# Patient Record
Sex: Female | Born: 1955
Health system: Southern US, Community
[De-identification: ages and names within clinical notes are randomized; demographics above are authoritative.]

## PROBLEM LIST (undated history)

## (undated) DIAGNOSIS — F429 Obsessive-compulsive disorder, unspecified: Secondary | ICD-10-CM

## (undated) DIAGNOSIS — M79671 Pain in right foot: Secondary | ICD-10-CM

## (undated) DIAGNOSIS — F419 Anxiety disorder, unspecified: Secondary | ICD-10-CM

## (undated) DIAGNOSIS — I10 Essential (primary) hypertension: Secondary | ICD-10-CM

## (undated) DIAGNOSIS — E7800 Pure hypercholesterolemia, unspecified: Secondary | ICD-10-CM

## (undated) DIAGNOSIS — J45909 Unspecified asthma, uncomplicated: Secondary | ICD-10-CM

## (undated) DIAGNOSIS — M1990 Unspecified osteoarthritis, unspecified site: Secondary | ICD-10-CM

## (undated) DIAGNOSIS — R269 Unspecified abnormalities of gait and mobility: Secondary | ICD-10-CM

## (undated) DIAGNOSIS — J329 Chronic sinusitis, unspecified: Secondary | ICD-10-CM

## (undated) DIAGNOSIS — K219 Gastro-esophageal reflux disease without esophagitis: Secondary | ICD-10-CM

## (undated) DIAGNOSIS — I341 Nonrheumatic mitral (valve) prolapse: Secondary | ICD-10-CM

## (undated) HISTORY — DX: Unspecified abnormalities of gait and mobility: R26.9

## (undated) HISTORY — DX: Obsessive-compulsive disorder, unspecified: F42.9

## (undated) HISTORY — PX: TRIGGER FINGER RELEASE: SHX641

## (undated) HISTORY — PX: CARPAL TUNNEL RELEASE: SHX101

## (undated) HISTORY — PX: TONSILLECTOMY AND ADENOIDECTOMY: SHX28

## (undated) HISTORY — DX: Pure hypercholesterolemia, unspecified: E78.00

## (undated) HISTORY — DX: Chronic sinusitis, unspecified: J32.9

## (undated) HISTORY — DX: Gastro-esophageal reflux disease without esophagitis: K21.9

## (undated) HISTORY — DX: Unspecified asthma, uncomplicated: J45.909

## (undated) HISTORY — DX: Anxiety disorder, unspecified: F41.9

## (undated) HISTORY — PX: HERNIA REPAIR: SHX51

## (undated) HISTORY — DX: Pain in right foot: M79.671

## (undated) HISTORY — DX: Unspecified osteoarthritis, unspecified site: M19.90

## (undated) HISTORY — DX: Nonrheumatic mitral (valve) prolapse: I34.1

## (undated) HISTORY — DX: Essential (primary) hypertension: I10

## (undated) HISTORY — PX: ABDOMINAL HYSTERECTOMY: SUR658

---
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
## 1997-11-03 ENCOUNTER — Encounter: Admission: RE | Admit: 1997-11-03 | Discharge: 1997-11-03 | Payer: Self-pay | Admitting: Hematology and Oncology

## 1997-11-09 ENCOUNTER — Ambulatory Visit (HOSPITAL_COMMUNITY): Admission: RE | Admit: 1997-11-09 | Discharge: 1997-11-09 | Payer: Self-pay

## 1997-11-14 ENCOUNTER — Encounter: Admission: RE | Admit: 1997-11-14 | Discharge: 1997-11-14 | Payer: Self-pay | Admitting: Internal Medicine

## 1997-12-09 ENCOUNTER — Encounter: Admission: RE | Admit: 1997-12-09 | Discharge: 1997-12-09 | Payer: Self-pay | Admitting: Hematology and Oncology

## 1998-03-19 ENCOUNTER — Encounter: Admission: RE | Admit: 1998-03-19 | Discharge: 1998-03-19 | Payer: Self-pay | Admitting: Internal Medicine

## 1998-03-25 ENCOUNTER — Encounter: Payer: Self-pay | Admitting: Internal Medicine

## 1998-03-25 ENCOUNTER — Ambulatory Visit (HOSPITAL_COMMUNITY): Admission: RE | Admit: 1998-03-25 | Discharge: 1998-03-25 | Payer: Self-pay | Admitting: *Deleted

## 1998-03-30 ENCOUNTER — Ambulatory Visit (HOSPITAL_COMMUNITY): Admission: RE | Admit: 1998-03-30 | Discharge: 1998-03-30 | Payer: Self-pay | Admitting: Internal Medicine

## 1998-03-30 ENCOUNTER — Encounter: Admission: RE | Admit: 1998-03-30 | Discharge: 1998-03-30 | Payer: Self-pay | Admitting: Hematology and Oncology

## 1998-05-04 ENCOUNTER — Other Ambulatory Visit: Admission: RE | Admit: 1998-05-04 | Discharge: 1998-05-04 | Payer: Self-pay | Admitting: *Deleted

## 1998-06-16 ENCOUNTER — Encounter: Admission: RE | Admit: 1998-06-16 | Discharge: 1998-06-16 | Payer: Self-pay | Admitting: Hematology and Oncology

## 1998-09-15 ENCOUNTER — Encounter: Payer: Self-pay | Admitting: Internal Medicine

## 1998-09-15 ENCOUNTER — Encounter: Admission: RE | Admit: 1998-09-15 | Discharge: 1998-09-15 | Payer: Self-pay | Admitting: Internal Medicine

## 1998-09-15 ENCOUNTER — Ambulatory Visit (HOSPITAL_COMMUNITY): Admission: RE | Admit: 1998-09-15 | Discharge: 1998-09-15 | Payer: Self-pay | Admitting: Internal Medicine

## 1998-09-30 ENCOUNTER — Encounter: Admission: RE | Admit: 1998-09-30 | Discharge: 1998-09-30 | Payer: Self-pay | Admitting: Internal Medicine

## 1998-12-02 ENCOUNTER — Other Ambulatory Visit: Admission: RE | Admit: 1998-12-02 | Discharge: 1998-12-02 | Payer: Self-pay | Admitting: *Deleted

## 1999-05-05 ENCOUNTER — Encounter: Admission: RE | Admit: 1999-05-05 | Discharge: 1999-05-05 | Payer: Self-pay | Admitting: Internal Medicine

## 1999-05-14 ENCOUNTER — Other Ambulatory Visit: Admission: RE | Admit: 1999-05-14 | Discharge: 1999-05-14 | Payer: Self-pay | Admitting: *Deleted

## 1999-06-03 ENCOUNTER — Encounter: Admission: RE | Admit: 1999-06-03 | Discharge: 1999-06-03 | Payer: Self-pay | Admitting: Internal Medicine

## 1999-07-14 ENCOUNTER — Encounter: Admission: RE | Admit: 1999-07-14 | Discharge: 1999-07-14 | Payer: Self-pay | Admitting: Hematology and Oncology

## 2000-06-14 ENCOUNTER — Other Ambulatory Visit: Admission: RE | Admit: 2000-06-14 | Discharge: 2000-06-14 | Payer: Self-pay | Admitting: Obstetrics & Gynecology

## 2000-07-15 ENCOUNTER — Encounter: Payer: Self-pay | Admitting: *Deleted

## 2000-07-15 ENCOUNTER — Ambulatory Visit (HOSPITAL_COMMUNITY): Admission: RE | Admit: 2000-07-15 | Discharge: 2000-07-15 | Payer: Self-pay | Admitting: *Deleted

## 2000-08-11 ENCOUNTER — Encounter: Admission: RE | Admit: 2000-08-11 | Discharge: 2000-08-11 | Payer: Self-pay

## 2001-05-07 ENCOUNTER — Encounter: Admission: RE | Admit: 2001-05-07 | Discharge: 2001-05-07 | Payer: Self-pay | Admitting: Internal Medicine

## 2001-05-11 ENCOUNTER — Encounter: Admission: RE | Admit: 2001-05-11 | Discharge: 2001-05-11 | Payer: Self-pay | Admitting: Internal Medicine

## 2001-06-21 ENCOUNTER — Other Ambulatory Visit: Admission: RE | Admit: 2001-06-21 | Discharge: 2001-06-21 | Payer: Self-pay | Admitting: Obstetrics & Gynecology

## 2001-12-25 ENCOUNTER — Encounter: Admission: RE | Admit: 2001-12-25 | Discharge: 2001-12-25 | Payer: Self-pay | Admitting: Internal Medicine

## 2002-01-29 ENCOUNTER — Encounter: Admission: RE | Admit: 2002-01-29 | Discharge: 2002-01-29 | Payer: Self-pay | Admitting: Internal Medicine

## 2002-02-12 ENCOUNTER — Encounter: Admission: RE | Admit: 2002-02-12 | Discharge: 2002-02-12 | Payer: Self-pay | Admitting: Internal Medicine

## 2002-02-26 ENCOUNTER — Encounter: Admission: RE | Admit: 2002-02-26 | Discharge: 2002-02-26 | Payer: Self-pay | Admitting: Internal Medicine

## 2002-05-21 ENCOUNTER — Encounter: Admission: RE | Admit: 2002-05-21 | Discharge: 2002-05-21 | Payer: Self-pay | Admitting: Internal Medicine

## 2002-06-13 ENCOUNTER — Other Ambulatory Visit: Admission: RE | Admit: 2002-06-13 | Discharge: 2002-06-13 | Payer: Self-pay | Admitting: Obstetrics & Gynecology

## 2002-06-20 ENCOUNTER — Encounter: Admission: RE | Admit: 2002-06-20 | Discharge: 2002-06-20 | Payer: Self-pay | Admitting: Internal Medicine

## 2002-12-23 ENCOUNTER — Ambulatory Visit (HOSPITAL_COMMUNITY): Admission: RE | Admit: 2002-12-23 | Discharge: 2002-12-23 | Payer: Self-pay | Admitting: Gastroenterology

## 2002-12-23 ENCOUNTER — Encounter (INDEPENDENT_AMBULATORY_CARE_PROVIDER_SITE_OTHER): Payer: Self-pay | Admitting: *Deleted

## 2003-01-08 ENCOUNTER — Encounter: Admission: RE | Admit: 2003-01-08 | Discharge: 2003-01-08 | Payer: Self-pay | Admitting: Internal Medicine

## 2003-03-06 ENCOUNTER — Encounter: Admission: RE | Admit: 2003-03-06 | Discharge: 2003-03-06 | Payer: Self-pay | Admitting: Internal Medicine

## 2003-06-16 ENCOUNTER — Other Ambulatory Visit: Admission: RE | Admit: 2003-06-16 | Discharge: 2003-06-16 | Payer: Self-pay | Admitting: Obstetrics & Gynecology

## 2003-08-13 ENCOUNTER — Encounter: Admission: RE | Admit: 2003-08-13 | Discharge: 2003-08-13 | Payer: Self-pay | Admitting: Internal Medicine

## 2003-08-25 ENCOUNTER — Ambulatory Visit (HOSPITAL_COMMUNITY): Admission: RE | Admit: 2003-08-25 | Discharge: 2003-08-25 | Payer: Self-pay | Admitting: Internal Medicine

## 2003-10-10 ENCOUNTER — Encounter: Admission: RE | Admit: 2003-10-10 | Discharge: 2003-10-10 | Payer: Self-pay | Admitting: Internal Medicine

## 2003-10-10 ENCOUNTER — Ambulatory Visit (HOSPITAL_COMMUNITY): Admission: RE | Admit: 2003-10-10 | Discharge: 2003-10-10 | Payer: Self-pay | Admitting: Internal Medicine

## 2003-10-23 ENCOUNTER — Encounter: Admission: RE | Admit: 2003-10-23 | Discharge: 2003-10-23 | Payer: Self-pay | Admitting: Internal Medicine

## 2003-11-03 ENCOUNTER — Encounter: Admission: RE | Admit: 2003-11-03 | Discharge: 2003-11-03 | Payer: Self-pay | Admitting: Internal Medicine

## 2003-11-26 ENCOUNTER — Ambulatory Visit (HOSPITAL_COMMUNITY): Admission: RE | Admit: 2003-11-26 | Discharge: 2003-11-26 | Payer: Self-pay | Admitting: Internal Medicine

## 2003-11-26 ENCOUNTER — Encounter: Payer: Self-pay | Admitting: Cardiovascular Disease

## 2003-12-31 ENCOUNTER — Encounter: Admission: RE | Admit: 2003-12-31 | Discharge: 2003-12-31 | Payer: Self-pay | Admitting: Internal Medicine

## 2004-04-13 ENCOUNTER — Ambulatory Visit: Payer: Self-pay | Admitting: Internal Medicine

## 2004-05-03 ENCOUNTER — Ambulatory Visit: Payer: Self-pay | Admitting: Internal Medicine

## 2004-09-09 ENCOUNTER — Ambulatory Visit: Payer: Self-pay | Admitting: Internal Medicine

## 2004-11-03 ENCOUNTER — Ambulatory Visit: Payer: Self-pay | Admitting: Internal Medicine

## 2004-11-08 ENCOUNTER — Ambulatory Visit: Payer: Self-pay | Admitting: Internal Medicine

## 2004-11-15 ENCOUNTER — Ambulatory Visit: Payer: Self-pay | Admitting: Internal Medicine

## 2004-12-03 ENCOUNTER — Encounter: Admission: RE | Admit: 2004-12-03 | Discharge: 2004-12-03 | Payer: Self-pay | Admitting: Allergy and Immunology

## 2004-12-10 ENCOUNTER — Ambulatory Visit (HOSPITAL_COMMUNITY): Admission: RE | Admit: 2004-12-10 | Discharge: 2004-12-10 | Payer: Self-pay | Admitting: Internal Medicine

## 2005-01-04 ENCOUNTER — Ambulatory Visit: Payer: Self-pay | Admitting: Internal Medicine

## 2005-04-13 ENCOUNTER — Ambulatory Visit: Payer: Self-pay | Admitting: Internal Medicine

## 2005-05-10 ENCOUNTER — Emergency Department (HOSPITAL_COMMUNITY): Admission: EM | Admit: 2005-05-10 | Discharge: 2005-05-10 | Payer: Self-pay | Admitting: Family Medicine

## 2005-07-14 ENCOUNTER — Ambulatory Visit: Payer: Self-pay | Admitting: Internal Medicine

## 2005-08-16 ENCOUNTER — Ambulatory Visit: Payer: Self-pay | Admitting: Internal Medicine

## 2006-04-05 ENCOUNTER — Ambulatory Visit: Payer: Self-pay | Admitting: Hospitalist

## 2006-04-05 ENCOUNTER — Other Ambulatory Visit: Admission: RE | Admit: 2006-04-05 | Discharge: 2006-04-05 | Payer: Self-pay | Admitting: Internal Medicine

## 2006-04-05 ENCOUNTER — Encounter (INDEPENDENT_AMBULATORY_CARE_PROVIDER_SITE_OTHER): Payer: Self-pay | Admitting: Pathology

## 2006-04-19 ENCOUNTER — Encounter (INDEPENDENT_AMBULATORY_CARE_PROVIDER_SITE_OTHER): Payer: Self-pay | Admitting: Internal Medicine

## 2006-04-19 ENCOUNTER — Ambulatory Visit: Payer: Self-pay | Admitting: Hospitalist

## 2006-06-10 DIAGNOSIS — I059 Rheumatic mitral valve disease, unspecified: Secondary | ICD-10-CM | POA: Insufficient documentation

## 2006-06-10 DIAGNOSIS — K5190 Ulcerative colitis, unspecified, without complications: Secondary | ICD-10-CM | POA: Insufficient documentation

## 2006-06-10 DIAGNOSIS — F411 Generalized anxiety disorder: Secondary | ICD-10-CM | POA: Insufficient documentation

## 2006-06-10 DIAGNOSIS — F429 Obsessive-compulsive disorder, unspecified: Secondary | ICD-10-CM | POA: Insufficient documentation

## 2006-06-10 DIAGNOSIS — J329 Chronic sinusitis, unspecified: Secondary | ICD-10-CM | POA: Insufficient documentation

## 2006-06-10 DIAGNOSIS — Z9889 Other specified postprocedural states: Secondary | ICD-10-CM | POA: Insufficient documentation

## 2006-06-10 DIAGNOSIS — J309 Allergic rhinitis, unspecified: Secondary | ICD-10-CM | POA: Insufficient documentation

## 2006-12-14 ENCOUNTER — Telehealth: Payer: Self-pay | Admitting: *Deleted

## 2007-01-01 ENCOUNTER — Ambulatory Visit: Payer: Self-pay | Admitting: Internal Medicine

## 2007-04-24 ENCOUNTER — Encounter (INDEPENDENT_AMBULATORY_CARE_PROVIDER_SITE_OTHER): Payer: Self-pay | Admitting: Internal Medicine

## 2007-04-24 ENCOUNTER — Ambulatory Visit: Payer: Self-pay | Admitting: Internal Medicine

## 2007-04-24 DIAGNOSIS — E049 Nontoxic goiter, unspecified: Secondary | ICD-10-CM | POA: Insufficient documentation

## 2007-04-24 DIAGNOSIS — I10 Essential (primary) hypertension: Secondary | ICD-10-CM | POA: Insufficient documentation

## 2007-04-24 HISTORY — DX: Essential (primary) hypertension: I10

## 2007-04-24 LAB — CONVERTED CEMR LAB
ALT: 22 units/L (ref 0–35)
Albumin: 4.7 g/dL (ref 3.5–5.2)
CO2: 23 meq/L (ref 19–32)
Cholesterol: 199 mg/dL (ref 0–200)
Glucose, Bld: 85 mg/dL (ref 70–99)
LDL Cholesterol: 120 mg/dL — ABNORMAL HIGH (ref 0–99)
Platelets: 254 10*3/uL (ref 150–400)
Potassium: 4 meq/L (ref 3.5–5.3)
RBC: 5.13 M/uL — ABNORMAL HIGH (ref 3.87–5.11)
Sodium: 140 meq/L (ref 135–145)
Total Bilirubin: 0.8 mg/dL (ref 0.3–1.2)
Total Protein: 7.5 g/dL (ref 6.0–8.3)
Triglycerides: 145 mg/dL (ref <150)
VLDL: 29 mg/dL (ref 0–40)
WBC: 7 10*3/uL (ref 4.0–10.5)

## 2007-05-18 ENCOUNTER — Emergency Department (HOSPITAL_COMMUNITY): Admission: EM | Admit: 2007-05-18 | Discharge: 2007-05-18 | Payer: Self-pay | Admitting: Emergency Medicine

## 2007-05-23 ENCOUNTER — Telehealth: Payer: Self-pay | Admitting: *Deleted

## 2007-05-24 ENCOUNTER — Ambulatory Visit (HOSPITAL_COMMUNITY): Admission: RE | Admit: 2007-05-24 | Discharge: 2007-05-24 | Payer: Self-pay | Admitting: Infectious Diseases

## 2007-05-24 ENCOUNTER — Ambulatory Visit: Payer: Self-pay | Admitting: Infectious Diseases

## 2007-06-07 ENCOUNTER — Ambulatory Visit: Payer: Self-pay | Admitting: Internal Medicine

## 2007-09-04 ENCOUNTER — Encounter (INDEPENDENT_AMBULATORY_CARE_PROVIDER_SITE_OTHER): Payer: Self-pay | Admitting: Internal Medicine

## 2007-10-19 ENCOUNTER — Encounter (INDEPENDENT_AMBULATORY_CARE_PROVIDER_SITE_OTHER): Payer: Self-pay | Admitting: Internal Medicine

## 2007-11-01 ENCOUNTER — Encounter (INDEPENDENT_AMBULATORY_CARE_PROVIDER_SITE_OTHER): Payer: Self-pay | Admitting: Internal Medicine

## 2007-11-23 ENCOUNTER — Ambulatory Visit (HOSPITAL_COMMUNITY): Admission: RE | Admit: 2007-11-23 | Discharge: 2007-11-23 | Payer: Self-pay | Admitting: Infectious Disease

## 2007-11-23 ENCOUNTER — Ambulatory Visit: Payer: Self-pay | Admitting: Infectious Disease

## 2007-11-26 ENCOUNTER — Encounter: Payer: Self-pay | Admitting: Infectious Disease

## 2007-12-21 ENCOUNTER — Encounter (INDEPENDENT_AMBULATORY_CARE_PROVIDER_SITE_OTHER): Payer: Self-pay | Admitting: Internal Medicine

## 2007-12-21 ENCOUNTER — Ambulatory Visit: Payer: Self-pay | Admitting: Internal Medicine

## 2007-12-21 LAB — CONVERTED CEMR LAB
AST: 18 units/L (ref 0–37)
Albumin: 4.4 g/dL (ref 3.5–5.2)
Alkaline Phosphatase: 32 units/L — ABNORMAL LOW (ref 39–117)
BUN: 12 mg/dL (ref 6–23)
Calcium: 9.3 mg/dL (ref 8.4–10.5)
Chloride: 101 meq/L (ref 96–112)
Glucose, Bld: 77 mg/dL (ref 70–99)
Potassium: 3.8 meq/L (ref 3.5–5.3)
Sodium: 138 meq/L (ref 135–145)
Total Protein: 7.1 g/dL (ref 6.0–8.3)

## 2008-01-22 ENCOUNTER — Encounter (INDEPENDENT_AMBULATORY_CARE_PROVIDER_SITE_OTHER): Payer: Self-pay | Admitting: Internal Medicine

## 2008-01-30 ENCOUNTER — Encounter (INDEPENDENT_AMBULATORY_CARE_PROVIDER_SITE_OTHER): Payer: Self-pay | Admitting: *Deleted

## 2008-01-30 ENCOUNTER — Ambulatory Visit: Payer: Self-pay | Admitting: Sports Medicine

## 2008-01-30 DIAGNOSIS — M19049 Primary osteoarthritis, unspecified hand: Secondary | ICD-10-CM | POA: Insufficient documentation

## 2008-02-04 ENCOUNTER — Ambulatory Visit (HOSPITAL_COMMUNITY): Admission: RE | Admit: 2008-02-04 | Discharge: 2008-02-04 | Payer: Self-pay | Admitting: Vascular Surgery

## 2008-03-17 ENCOUNTER — Ambulatory Visit: Payer: Self-pay | Admitting: *Deleted

## 2008-03-17 DIAGNOSIS — E785 Hyperlipidemia, unspecified: Secondary | ICD-10-CM | POA: Insufficient documentation

## 2008-05-01 ENCOUNTER — Ambulatory Visit: Payer: Self-pay | Admitting: *Deleted

## 2008-07-10 ENCOUNTER — Telehealth (INDEPENDENT_AMBULATORY_CARE_PROVIDER_SITE_OTHER): Payer: Self-pay | Admitting: Internal Medicine

## 2009-01-30 ENCOUNTER — Ambulatory Visit: Payer: Self-pay | Admitting: Infectious Diseases

## 2009-01-30 ENCOUNTER — Encounter (INDEPENDENT_AMBULATORY_CARE_PROVIDER_SITE_OTHER): Payer: Self-pay | Admitting: Internal Medicine

## 2009-02-03 LAB — CONVERTED CEMR LAB
AST: 20 units/L (ref 0–37)
Alkaline Phosphatase: 36 units/L — ABNORMAL LOW (ref 39–117)
BUN: 13 mg/dL (ref 6–23)
Creatinine, Ser: 0.63 mg/dL (ref 0.40–1.20)
Ferritin: 66 ng/mL (ref 10–291)
Glucose, Bld: 85 mg/dL (ref 70–99)
HCT: 43 % (ref 36.0–46.0)
Hemoglobin: 15.6 g/dL — ABNORMAL HIGH (ref 12.0–15.0)
Iron: 178 ug/dL — ABNORMAL HIGH (ref 42–145)
LDL Cholesterol: 122 mg/dL — ABNORMAL HIGH (ref 0–99)
MCHC: 36.3 g/dL — ABNORMAL HIGH (ref 30.0–36.0)
MCV: 84.5 fL (ref 78.0–100.0)
Potassium: 3.8 meq/L (ref 3.5–5.3)
RBC: 5.09 M/uL (ref 3.87–5.11)
RDW: 13.1 % (ref 11.5–15.5)
TSH: 0.879 microintl units/mL (ref 0.350–4.500)
Total Bilirubin: 0.9 mg/dL (ref 0.3–1.2)
Triglycerides: 80 mg/dL (ref <150)
VLDL: 16 mg/dL (ref 0–40)

## 2009-02-13 ENCOUNTER — Ambulatory Visit: Payer: Self-pay | Admitting: Infectious Diseases

## 2009-03-25 ENCOUNTER — Ambulatory Visit: Payer: Self-pay | Admitting: Internal Medicine

## 2009-04-01 ENCOUNTER — Telehealth: Payer: Self-pay | Admitting: *Deleted

## 2009-04-15 ENCOUNTER — Ambulatory Visit: Payer: Self-pay | Admitting: Infectious Diseases

## 2009-05-28 ENCOUNTER — Ambulatory Visit: Payer: Self-pay | Admitting: Internal Medicine

## 2009-08-18 ENCOUNTER — Emergency Department (HOSPITAL_COMMUNITY): Admission: EM | Admit: 2009-08-18 | Discharge: 2009-08-18 | Payer: Self-pay | Admitting: Emergency Medicine

## 2009-11-19 ENCOUNTER — Encounter (INDEPENDENT_AMBULATORY_CARE_PROVIDER_SITE_OTHER): Payer: Self-pay | Admitting: Internal Medicine

## 2009-11-19 ENCOUNTER — Telehealth: Payer: Self-pay | Admitting: *Deleted

## 2009-11-30 ENCOUNTER — Ambulatory Visit: Payer: Self-pay | Admitting: Internal Medicine

## 2009-12-01 LAB — CONVERTED CEMR LAB
ALT: 18 units/L (ref 0–35)
AST: 22 units/L (ref 0–37)
Calcium: 9.5 mg/dL (ref 8.4–10.5)
Chloride: 103 meq/L (ref 96–112)
Creatinine, Ser: 0.6 mg/dL (ref 0.40–1.20)
HCT: 42.7 % (ref 36.0–46.0)
Platelets: 295 10*3/uL (ref 150–400)
RDW: 13.6 % (ref 11.5–15.5)
Sodium: 139 meq/L (ref 135–145)
Total CHOL/HDL Ratio: 3.4
Total Protein: 7.4 g/dL (ref 6.0–8.3)
VLDL: 16 mg/dL (ref 0–40)
Vit D, 25-Hydroxy: 46 ng/mL (ref 30–89)
WBC: 9 10*3/uL (ref 4.0–10.5)

## 2010-03-01 ENCOUNTER — Telehealth: Payer: Self-pay | Admitting: Internal Medicine

## 2010-04-26 ENCOUNTER — Ambulatory Visit: Payer: Self-pay | Admitting: Internal Medicine

## 2010-05-25 ENCOUNTER — Encounter: Payer: Self-pay | Admitting: Internal Medicine

## 2010-06-03 ENCOUNTER — Ambulatory Visit: Payer: Self-pay | Admitting: Internal Medicine

## 2010-06-03 DIAGNOSIS — M6530 Trigger finger, unspecified finger: Secondary | ICD-10-CM | POA: Insufficient documentation

## 2010-06-03 LAB — CONVERTED CEMR LAB
Cholesterol, target level: 200 mg/dL
LDL Goal: 160 mg/dL

## 2010-06-03 LAB — HM MAMMOGRAPHY

## 2010-06-07 ENCOUNTER — Ambulatory Visit (HOSPITAL_COMMUNITY)
Admission: RE | Admit: 2010-06-07 | Discharge: 2010-06-07 | Payer: Self-pay | Source: Home / Self Care | Admitting: Gastroenterology

## 2010-07-28 ENCOUNTER — Ambulatory Visit: Payer: Self-pay | Admitting: Internal Medicine

## 2010-07-28 ENCOUNTER — Ambulatory Visit (HOSPITAL_COMMUNITY)
Admission: RE | Admit: 2010-07-28 | Discharge: 2010-07-28 | Payer: Self-pay | Source: Home / Self Care | Attending: Internal Medicine | Admitting: Internal Medicine

## 2010-08-04 ENCOUNTER — Ambulatory Visit
Admission: RE | Admit: 2010-08-04 | Discharge: 2010-08-04 | Payer: Self-pay | Source: Home / Self Care | Attending: Sports Medicine | Admitting: Sports Medicine

## 2010-08-04 DIAGNOSIS — M79609 Pain in unspecified limb: Secondary | ICD-10-CM | POA: Insufficient documentation

## 2010-08-21 ENCOUNTER — Encounter: Payer: Self-pay | Admitting: Internal Medicine

## 2010-08-31 NOTE — Progress Notes (Signed)
Summary: gi providers/ hla  Phone Note Call from Patient   Summary of Call: pt calls and wants the list of gi providers for the orange card so she can make arrangements for a colonoscopy. i explained that she would need to be seen in the office by a physician here and a referral initiated from that visit also that since in had been 9/10 since she was seen in the office it was definitely time for an appt. she stated she does have an appt in may, i confirmed the appt and instructed her to speak w/ the md then about the gi referral. she then ask again for the list so she could make the appt again i said this would be done at her visit. the call was ended by pt Initial call taken by: Freddy Finner RN,  November 19, 2009 5:34 PM

## 2010-08-31 NOTE — Letter (Signed)
Summary: [facility] [place]   Imported By: Garlan Fillers 12/09/2009 15:27:08  _____________________________________________________________________  External Attachment:    Type:   Image     Comment:   External Document

## 2010-08-31 NOTE — Assessment & Plan Note (Signed)
Summary: flu shot/ch  Nurse Visit   Allergies: 1)  ! Sulfa 2)  ! Amoxicillin 3)  ! Atenolol  Immunizations Administered:  Influenza Vaccine # 1:    Vaccine Type: Fluvax Non-MCR    Site: left deltoid    Mfr: GlaxoSmithKline    Dose: 0.5 ml    Route: IM    Given by: Hilda Blades Ditzler RN    Exp. Date: 01/29/2011    Lot #: [id number]    VIS given: 02/23/10 version given April 26, 2010.  Flu Vaccine Consent Questions:    Do you have a history of severe allergic reactions to this vaccine? no    Any prior history of allergic reactions to egg and/or gelatin? no    Do you have a sensitivity to the preservative Thimersol? no    Do you have a past history of Guillan-Barre Syndrome? no    Do you currently have an acute febrile illness? no    Have you ever had a severe reaction to latex? no    Vaccine information given and explained to patient? yes    Are you currently pregnant? no  Orders Added: 1)  Influenza Vaccine NON MCR [[id number]]

## 2010-08-31 NOTE — Assessment & Plan Note (Signed)
Summary: est-ck/fu/meds/cfb   Vital Signs:  Patient profile:   55 year old female Height:      63 inches Weight:      135.6 pounds BMI:     24.11 Temp:     97.4 degrees F oral Pulse rate:   85 / minute BP sitting:   134 / 94  (right arm)  Vitals Entered By: Silverio Decamp NT II (Nov 30, 2009 2:36 PM) CC: NEED REFILLS Is Patient Diabetic? No Pain Assessment Patient in pain? no      Nutritional Status BMI of 19 -24 = normal  Have you ever been in a relationship where you felt threatened, hurt or afraid?No   Does patient need assistance? Functional Status Self care Ambulation Normal   Primary Care Provider:  Myrtis Ser MD  CC:  NEED REFILLS.  History of Present Illness: 53yof with pmh in this chart here for check up and refills.  She has no complaints today.  She has a lot of questions which she has written down on a pad of paper as usual.  She seems mostly concerned about how to get a colonoscopy to follow up on her UC, she has no insurance and wonders if we can help her with this.  She would also like to discuss her blood pressure and cholesterol and heart disease risk.  She is concerned about her posture and risk of osteoporosis/kyphosis.  Over all she seems to be doing well, does not have flares of UC very often.  Feels like the paxil is helping with symptoms of anxiety, checking things, OCD.    Preventive Screening-Counseling & Management  Alcohol-Tobacco     Alcohol drinks/day: 0     Smoking Status: never  Caffeine-Diet-Exercise     Does Patient Exercise: no  Current Medications (verified): 1)  Zyrtec 10 Mg Tabs (Cetirizine Hcl) .... Take 1 Tablet By Mouth Once A Day 2)  Nasonex  Susp (Mometasone Furoate Susp) .... Place 1 Spray in Each Nostril Once Daily 3)  Astelin 137 Mcg/spray Soln (Azelastine Hcl) .... Place 1 Spray in Each Nostril Once Daily 4)  Xanax 0.25 Mg Tabs (Alprazolam) .... Take 1/2 Tablet By Mouth Once A Day At Bedtime As Needed For Anxiety or  Insomnia 5)  Optivar  Soln (Azelastine Hcl Soln) .... Use As Directed By Your Allergist 6)  Albuterol 90 Mcg/act Aers (Albuterol) .... Inhale 2 Puffs Every 6 Hours As Needed For Shortness of Breath or Wheezing 7)  Paxil 20 Mg  Tabs (Paroxetine Hcl) .... Take 1 and 1/2 Tablet Every Day. 8)  Canasa  Supp (Mesalamine Supp) .... Use As Directed By Your Gastroenterologist For Ulcerative Colitis 9)  Aciphex 20 Mg  Tbec (Rabeprazole Sodium) .... Take One Tablet Twice A Day. 10)  Loestrin 1/20 (21) 1-20 Mg-Mcg  Tabs (Norethindrone Acet-Ethinyl Est) 11)  Singulair 10 Mg  Tabs (Montelukast Sodium) 12)  Lialda 1.2 Gm Tbec (Mesalamine) .... Take One Tablet Daily.  Allergies (verified): 1)  ! Sulfa 2)  ! Amoxicillin 3)  ! Atenolol  Review of Systems       per hpi  Physical Exam  General:  alert and well-developed.   Head:  normocephalic and atraumatic.   Eyes:  vision grossly intact, pupils equal, pupils round, and pupils reactive to light.   Lungs:  normal respiratory effort and normal breath sounds.   Heart:  normal rate, regular rhythm, and no murmur.   Abdomen:  soft, non-tender, and normal bowel sounds.  Pulses:  2+ Extremities:  no edema Neurologic:  alert & oriented X3, cranial nerves II-XII intact, and gait normal.   Psych:  Oriented X3, memory intact for recent and remote, normally interactive, good eye contact, and slightly anxious.     Impression & Recommendations:  Problem # 1:  HYPERLIPIDEMIA (XKG-818.4) Will check lipids and cmet today.  Orders: T-Comprehensive Metabolic Panel ([id number]) T-Lipid Profile ([id number])  Problem # 2:  OBSESSIVE-COMPULSIVE DISORDER (ICD-300.3) Doing well on paxil.  She is very aware of her symptoms and keeps them in check.   Problem # 3:  ULCERATIVE COLITIS (ICD-556.9)  Needs another colonocsopy, usually sees Dr. Collene Mares.  Does not have any insurance.  She does have the orange card, but Dr. Lorie Apley office does not take the card.  We will  look around for options to help her afford this necisary test.  She does say that she could go into a payment plan with Dr. Lorie Apley office if needed, which might be the best option.  Orders: T-CBC No Diff ([id number]) T-Vitamin D (25-Hydroxy) ([id number])  Complete Medication List: 1)  Zyrtec 10 Mg Tabs (Cetirizine hcl) .... Take 1 tablet by mouth once a day 2)  Nasonex Susp (Mometasone furoate susp) .... Place 1 spray in each nostril once daily 3)  Astelin 137 Mcg/spray Soln (Azelastine hcl) .... Place 1 spray in each nostril once daily 4)  Xanax 0.25 Mg Tabs (Alprazolam) .... Take 1/2 tablet by mouth once a day at bedtime as needed for anxiety or insomnia 5)  Optivar Soln (Azelastine hcl soln) .... Use as directed by your allergist 6)  Albuterol 90 Mcg/act Aers (Albuterol) .... Inhale 2 puffs every 6 hours as needed for shortness of breath or wheezing 7)  Paxil 20 Mg Tabs (Paroxetine hcl) .... Take 1 and 1/2 tablet every day. 8)  Canasa Supp (Mesalamine supp) .... Use as directed by your gastroenterologist for ulcerative colitis 9)  Aciphex 20 Mg Tbec (Rabeprazole sodium) .... Take one tablet twice a day. 10)  Loestrin 1/20 (21) 1-20 Mg-mcg Tabs (Norethindrone acet-ethinyl est) 11)  Singulair 10 Mg Tabs (Montelukast sodium) 12)  Lialda 1.2 Gm Tbec (Mesalamine) .... Take one tablet daily.  Patient Instructions: 1)  You had labwork done today, we will call you if there is anything that needs to be addressed before your next appointment. 2)  We help you with a gi referal for colonoscopy. 3)  Please schedule a follow-up appointment in 6 months. Prescriptions: PAXIL 20 MG  TABS (PAROXETINE HCL) Take 1 and 1/2 tablet every day.  #60 x 5   Entered and Authorized by:   Myrtis Ser MD   Signed by:   Myrtis Ser MD on 11/30/2009   Method used:   Print then Give to Patient   RxID:   [id number]  Process Orders Check Orders Results:     [facility]: WSF not  required for this insurance Tests Sent for requisitioning (Dec 01, 2009 9:20 AM):     11/30/2009: [facility] -- T-Comprehensive Metabolic Panel [[id number]] (signed)     11/30/2009: [facility] -- T-Lipid Profile [phone] (signed)     11/30/2009: [facility] -- T-CBC No Diff [[id number]] (signed)     11/30/2009: [facility] -- T-Vitamin D (25-Hydroxy) [phone] (signed)    Prevention & Chronic Care Immunizations   Influenza vaccine: Fluvax 3+  (05/28/2009)    Tetanus booster: Not documented    Pneumococcal vaccine: Not documented  Colorectal Screening  Hemoccult: Not documented   Hemoccult action/deferral: Ordered  (01/30/2009)    Colonoscopy: Results: Normal. chronic changes of ulcerative colitis.   (10/16/2007)   Colonoscopy action/deferral: Not indicated  (01/30/2009)  Other Screening   Pap smear: Not documented   Pap smear action/deferral: Not indicated S/P hysterectomy  (01/30/2009)    Mammogram: Normal  (06/08/2006)   Mammogram action/deferral: Ordered  (11/30/2009)  Reports requested:   Last mammogram report requested.  Smoking status: never  (11/30/2009)  Lipids   Total Cholesterol: 195  (01/30/2009)   Lipid panel action/deferral: Lipid Panel ordered   LDL: 122  (01/30/2009)   LDL Direct: Not documented   HDL: 57  (01/30/2009)   Triglycerides: 80  (01/30/2009)    SGOT (AST): 20  (01/30/2009)   BMP action: Ordered   SGPT (ALT): 19  (01/30/2009) CMP ordered    Alkaline phosphatase: 36  (01/30/2009)   Total bilirubin: 0.9  (01/30/2009)    Lipid flowsheet reviewed?: Yes   Progress toward LDL goal: Unchanged  Self-Management Support :    Lipid self-management support: Pre-printed educational material  (02/13/2009)    Nursing Instructions: Schedule screening mammogram (see order) Request report of last mammogram

## 2010-08-31 NOTE — Assessment & Plan Note (Signed)
Summary: ra/needs routine checkup/ch   Vital Signs:  Patient profile:   55 year old female Height:      63 inches (160.02 cm) Weight:      134.4 pounds (61.09 kg) BMI:     23.89 Temp:     97.1 degrees F (36.17 degrees C) oral Pulse rate:   88 / minute BP sitting:   148 / 96  (right arm)  Vitals Entered By: Hilda Blades Ditzler RN (June 03, 2010 3:15 PM) CC: Lipid Management Is Patient Diabetic? No Pain Assessment Patient in pain? no      Nutritional Status BMI of 19 -24 = normal Nutritional Status Detail appetite good  Have you ever been in a relationship where you felt threatened, hurt or afraid?denies   Does patient need assistance? Functional Status Self care Ambulation Normal Comments Ck-up and ck left foot - 3rd toe - problems x 2 months. Discuss doing labs.   Primary Care Provider:  Myrtis Ser MD  CC:  Lipid Management.  History of Present Illness: Patient here for check-up.   Carpal tunnel:  Has had carpal tunnel in the past s/p surgery - pt complains that she has woken up for he past few weeks with some hand stiffness that goes away during the day.  She has tried tylenol for this and it seems to help.  Has history of fractured right index finger and trigger finger, but these have not recurred.  Patient also reports single toe cramp about 2 months ago that lasted for 10 minutes - she wants it to be examined to be sure everything is OK.  Allergies are relatively well-managed on current medications and is taking allergy shots.    UC:  Scheduled for colonoscopy on November 7 with Dr. Collene Mares.  Stable without   Has had UC since perhaps 1610 (she is uncertain).    Depression History:      The patient denies a depressed mood most of the day and a diminished interest in her usual daily activities.         Lipid Management History:      Negative NCEP/ATP III risk factors include female age less than 13 years old, HDL cholesterol greater than 60, and non-tobacco-user  status.     Preventive Screening-Counseling & Management  Alcohol-Tobacco     Alcohol drinks/day: 0     Smoking Status: never  Caffeine-Diet-Exercise     Does Patient Exercise: no  Current Problems (verified): 1)  Hyperlipidemia  (ICD-272.4) 2)  Degenerative Joint Disease, Fingers  (ICD-715.94) 3)  Health Maintenance Exam  (ICD-V70.0) 4)  Elevated Blood Pressure Without Diagnosis of Hypertension  (ICD-796.2) 5)  Goiter Nos  (ICD-240.9) 6)  Allergic Rhinitis  (ICD-477.9) 7)  Hx of Carpal Tunnel Release, Hx of  (ICD-V45.89) 8)  Obsessive-compulsive Disorder  (ICD-300.3) 9)  Anxiety Disorder  (ICD-300.00) 10)  Ulcerative Colitis  (ICD-556.9) 11)  Mitral Valve Prolapse  (ICD-424.0) 12)  Sinusitis, Chronic  (ICD-473.9) 13)  Hx of Trigger Finger  (ICD-727.03)  Current Medications (verified): 1)  Zyrtec 10 Mg Tabs (Cetirizine Hcl) .... Take 1 Tablet By Mouth Once A Day 2)  Nasonex  Susp (Mometasone Furoate Susp) .... Place 1 Spray in Each Nostril Once Daily 3)  Astelin 137 Mcg/spray Soln (Azelastine Hcl) .... Place 1 Spray in Each Nostril Once Daily 4)  Bepreve 1.5 % Soln (Bepotastine Besilate) .... Use As Directed By Your Ophthalmologist Dr. Gershon Crane 5)  Albuterol 90 Mcg/act Aers (Albuterol) .... Inhale 2 Puffs Every  6 Hours As Needed For Shortness of Breath or Wheezing 6)  Paxil 20 Mg  Tabs (Paroxetine Hcl) .... Take 1 and 1/2 Tablet Every Day. 7)  Canasa  Supp (Mesalamine Supp) .... Use As Directed By Your Gastroenterologist For Ulcerative Colitis 8)  Aciphex 20 Mg  Tbec (Rabeprazole Sodium) .... Take One Tablet Twice A Day. 9)  Singulair 10 Mg  Tabs (Montelukast Sodium) 10)  Lialda 1.2 Gm Tbec (Mesalamine) .... Take One Tablet Daily. 11)  Qvar 80 Mcg/act Aers (Beclomethasone Dipropionate) .... 2 Puffs Inh Daily  Allergies: 1)  ! Sulfa 2)  ! Amoxicillin 3)  ! Atenolol 4)  ! * Trimox 5)  ! Tenormin 6)  ! 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  Past Medical History: Trigger finger-to see Dr. TCharlestine NightChronic sinusitis Mitral valve prolapse Ulcerative colitis- sees Dr. MCollene MaresAnxiety disorder Obsessive compulsive disorder Carpal tunnel syndrome, s/p release Allergic rhinitis Petechial rash, hx of (RMSF titers negative)  Past Surgical History: Carpal tunnel release early '90's, bilateral Hysterectomy  Family History: Diabetes - brother and grandmother HTN - mother HLP - mother Mother had MI at 732yr Social History: Reviewed history from 01/30/2009 and no changes required. 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  The patient denies fever, weight loss, weight gain, chest pain, dyspnea on exertion, prolonged cough, melena, hematochezia, and depression.    Physical Exam  General:  alert, well-developed, well-nourished, and well-hydrated.   Head:  normocephalic and atraumatic.   Mouth:  pharynx pink and moist.   Neck:  supple and full ROM.   Lungs:  normal respiratory effort and normal breath sounds.   Heart:  normal rate, regular rhythm, and no murmur.   Abdomen:  soft, non-tender, and normal bowel sounds.   Msk:  No swelling, joint tenderness or erythema of joints of hands or feet. Pulses:  2+  bilateral DP pulses Extremities:  no edema Neurologic:  alert & oriented X3 and gait normal.   Psych:  Oriented X3, normally interactive, good eye contact, not anxious appearing, not depressed appearing, and not agitated.     Impression & Recommendations:  Problem # 1:  ULCERATIVE COLITIS (ICD-556.9) Assessment Unchanged Stable without symptoms of acute flare.  Patient scheduled for surveillance colonoscopy Jun 07, 2010 with Dr. MaCollene Mareswho follows her for this.  Problem # 2:  ELEVATED BLOOD PRESSURE WITHOUT DIAGNOSIS OF HYPERTENSION (ICD-796.2) Assessment: Deteriorated Elevated BP today on recheck to 165/102 with a manual cuff, but this appears to be an outlier - patient has had elevated BPs scattered in the past, but because she keeps crossing the threshold for HTN and the rest of her profile is low-risk ([place] risk of 2% with today's BP), will not treat at this time but will continue to monitor.    Problem # 3:  HYPERLIPIDEMIA (ICPPI-951) Assessment: Deteriorated Lipids elevated in May -  will recheck FLP at next visit in 6 months. Pt with family history of HLP in mother, however pt's current risk is low (see above problem), so will not start treatment with statin at this time.  Problem # 4:  OBSESSIVE-COMPULSIVE DISORDER (ICD-300.3) Assessment: Unchanged Well-managed on current regimen. Refilled Paxil.  Problem # 5:  Preventive Health Care (ICD-V70.0) Assessment: Comment Only Pt has gotten flu shot and mammogram this year.  Has had a hysterectomy.  Complete Medication List: 1)  Zyrtec 10 Mg Tabs (Cetirizine hcl) .... Take 1 tablet by mouth once a day 2)  Nasonex Susp (Mometasone furoate susp) .... Place 1 spray in each nostril once daily 3)  Astelin 137 Mcg/spray Soln (Azelastine hcl) .... Place 1 spray in each nostril once daily 4)  Bepreve 1.5 % Soln (Bepotastine besilate) .... Use as directed by your ophthalmologist dr. Gershon Crane 5)  Albuterol 90 Mcg/act Aers (Albuterol) ....  Inhale 2 puffs every 6 hours as needed for shortness of breath or wheezing 6)  Paxil 20 Mg Tabs (Paroxetine hcl) .... Take 1 and 1/2 tablet every day. 7)  Canasa Supp (Mesalamine supp) .... Use as directed by your gastroenterologist for ulcerative colitis 8)  Aciphex 20 Mg Tbec (Rabeprazole sodium) .... Take one tablet twice a day. 9)  Singulair 10 Mg Tabs (Montelukast sodium) 10)  Lialda 1.2 Gm Tbec (Mesalamine) .... Take one tablet daily. 11)  Qvar 80 Mcg/act Aers (Beclomethasone dipropionate) .... 2 puffs inh daily  Lipid Assessment/Plan:      Based on NCEP/ATP III, the patient's risk factor category is "0-1 risk factors".  The patient's lipid goals are as follows: Total cholesterol goal is 200; LDL cholesterol goal is 160; HDL cholesterol goal is 40; Triglyceride goal is 150.     Patient Instructions: 1)  Please return for follow-up appointment in 6 months. At that time you will need to be fasting for labwork.  2)  Please continue to take your medicines as directed. Prescriptions: PAXIL 20 MG  TABS (PAROXETINE HCL) Take 1 and 1/2 tablet every day.  #60 x 5   Entered and Authorized by:   Rikki Spearing, MD   Signed by:   Rikki Spearing, MD on 06/03/2010   Method used:   Print then Give to Patient   RxID:   [id number]    Orders Added: 1)  Est. Patient Level IV [77939]     Prevention & Chronic Care Immunizations   Influenza vaccine: Fluvax Non-MCR  (04/26/2010)    Tetanus booster: Not documented    Pneumococcal vaccine: Not documented  Colorectal Screening   Hemoccult: Not documented   Hemoccult action/deferral: Ordered  (01/30/2009)    Colonoscopy: Results: Normal. chronic changes of ulcerative colitis.   (10/16/2007)   Colonoscopy action/deferral: Not indicated  (01/30/2009)  Other Screening   Pap smear: Not documented   Pap smear action/deferral: Not indicated S/P hysterectomy  (01/30/2009)    Mammogram: Assessment: BIRADS 1.   (06/03/2010)   Mammogram  action/deferral: Screening mammogram in 1 year.     (06/03/2010)   Mammogram due: 06/2011   Smoking status: never  (06/03/2010)  Lipids   Total Cholesterol: 219  (12/01/2009)   Lipid panel action/deferral: Lipid Panel ordered   LDL: 139  (12/01/2009)   LDL Direct: Not documented   HDL: 64  (12/01/2009)   Triglycerides: 81  (12/01/2009)    SGOT (AST): 22  (12/01/2009)   BMP action: Ordered   SGPT (ALT): 18  (12/01/2009)   Alkaline phosphatase: 32  (  12/01/2009)   Total bilirubin: 0.8  (12/01/2009)    Lipid flowsheet reviewed?: Yes   Progress toward LDL goal: Deteriorated  Self-Management Support :    Patient will work on the following items until the next clinic visit to reach self-care goals:     Medications and monitoring: take my medicines every day, bring all of my medications to every visit, weigh myself weekly  (06/03/2010)     Eating: eat more vegetables, use fresh or frozen vegetables, eat foods that are low in salt, eat fruit for snacks and desserts, limit or avoid alcohol  (06/03/2010)     Activity: take a 30 minute walk every day  (06/03/2010)    Lipid self-management support: Written self-care plan, Education handout, Resources for patients handout  (06/03/2010)   Lipid self-care plan printed.   Lipid education handout printed      Resource handout printed.

## 2010-08-31 NOTE — Progress Notes (Signed)
Summary: GI referral  Phone Note Call from Patient   Caller: Patient Call For: Rikki Spearing MD Complaint: Urinary/GYN Problems Summary of Call: Call from pt wants to know who is on.  Says that she wants to know who is the GI for August and September.  Michela Pitcher that she has been to [facility] Dr. Collene Mares.  Call to there office pt has come there in the past and has paid out of pocket.  Pt said that she has a [facility]  card and was told that we would schedule her in the Clinics for a  Colonoscopy to be preformed in the clinics.  Pt was informd that we would refer  her the GI on call for the month.  All do not acxept the [facility] for payment but would probby except some sort of payment plan.  Pt voiced an understanding of.  Pt will need to be schduled a month that [facility] is the on call GI. Sander Nephew RN  March 01, 2010 4:20 PM   Follow-up for Phone Call        I was informed by [facility] that the patient has an appointment scheduled for colonoscopy with Dr. Collene Mares on Monday, Nov 7 at 7:30am.  Follow-up by: Rikki Spearing, MD,  June 03, 2010 2:49 PM

## 2010-09-01 ENCOUNTER — Encounter: Payer: Self-pay | Admitting: Family Medicine

## 2010-09-01 ENCOUNTER — Ambulatory Visit: Payer: Self-pay | Admitting: Family Medicine

## 2010-09-01 ENCOUNTER — Ambulatory Visit: Admit: 2010-09-01 | Payer: Self-pay | Admitting: Sports Medicine

## 2010-09-01 DIAGNOSIS — M79609 Pain in unspecified limb: Secondary | ICD-10-CM

## 2010-09-01 DIAGNOSIS — M6530 Trigger finger, unspecified finger: Secondary | ICD-10-CM

## 2010-09-02 NOTE — Assessment & Plan Note (Signed)
Summary: TRIGGER FINGER PAIN/NP/LP   Vital Signs:  Patient profile:   55 year old female Height:      63 inches Weight:      135 pounds BP sitting:   113 / 77  Vitals Entered By: Christina Burton CMA (August 04, 2010 3:25 PM)   Primary Provider:  Myrtis Ser MD   History of Present Illness: 55 yo F referred by IM clinic for eval of Rt 4th trigger finger and Lt 3rd toe pain.  trigger finger - present on Rt 4th finger.  Has prev had similar one on index finger that responed to CSI.  Also had trigger thumb release surgery in past.  Denies h/o DM.  Would like to do CSI today.  Lt 3rd toe - pain present x 5 months.  States in 08/11, was just sitting and her toe "came out."  Finally popped it back in. Since then has had persistent pain with walking and palpation.  No paresthesias.  Preventive Screening-Counseling & Management  Alcohol-Tobacco     Smoking Status: never  Allergies: 1)  ! Sulfa 2)  ! Amoxicillin 3)  ! Atenolol 4)  ! * Trimox 5)  ! Tenormin 6)  ! * Septicane  Physical Exam  General:  Well-developed,well-nourished,in no acute distress; alert,appropriate and cooperative throughout examination Msk:  Rt hand : + nodule near A1 pulley on 4th finger that can reproducibly trigger  Lt foot: + ttp over 3rd MTP, no obvious erythema or swelling. squeeze test does not reproduce neuroma type symptoms.  + tenderness over dorsal and plantar aspects of joint.  MSK [place] of Lt 3rd MTP - small avulsion fracture present off distal 3rd MT.  fairly large joint effusion present in 3rd MTP joint. Neurologic:  alert & oriented X3.     Impression & Recommendations:  Problem # 1:  FOOT PAIN, LEFT (ICD-729.5) Appears to be post traumatic with small avulsion fx that has never healed and persistent joint effusion following likely subluxation/dislocation. - placed in post op shoe - f/u 1 month  Orders: Post-op Shoe (L3260) [place] LIMITED ([id number])  Problem # 2:  TRIGGER FINGER  (ICD-727.03) CSI performed today, see procedure note f/u 1 month  Consent obtained and verified. Sterile betadine prep. Furthur cleansed with alcohol. Topical analgesic spray: Ethyl chloride. Joint: Rt 4th trigger finger Approached in typical fashion with: direct approach Completed without difficulty Meds: 0.4 cc of 10 mg kenalog and 0.4 cc 1% lidocaine Needle: 30 G 5/8 inch Aftercare instructions and Red flags advised.  Orders: Trigger Point Injection Single Tendon Origin/Insertion [id number]) Kenalog 10 mg inj (J3301)  Complete Medication List: 1)  Zyrtec 10 Mg Tabs (Cetirizine hcl) .... Take 1 tablet by mouth once a day 2)  Nasonex Susp (Mometasone furoate susp) .... Place 1 spray in each nostril once daily 3)  Astelin 137 Mcg/spray Soln (Azelastine hcl) .... Place 1 spray in each nostril once daily 4)  Bepreve 1.5 % Soln (Bepotastine besilate) .... Use as directed by your ophthalmologist dr. Gershon Crane 5)  Albuterol 90 Mcg/act Aers (Albuterol) .... Inhale 2 puffs every 6 hours as needed for shortness of breath or wheezing 6)  Paxil 20 Mg Tabs (Paroxetine hcl) .... Take 1 and 1/2 tablet every day. 7)  Canasa Supp (Mesalamine supp) .... Use as directed by your gastroenterologist for ulcerative colitis 8)  Aciphex 20 Mg Tbec (Rabeprazole sodium) .... Take one tablet twice a day. 9)  Singulair 10 Mg Tabs (Montelukast sodium) 10)  Lialda 1.2 Gm  Tbec (Mesalamine) .... Take one tablet daily. 11)  Qvar 80 Mcg/act Aers (Beclomethasone dipropionate) .... 2 puffs inh daily   Orders Added: 1)  Post-op Shoe [L3260] 2)  Est. Patient Level III [86773] 3)  [place] LIMITED [[id number]] 4)  Trigger Point Injection Single Tendon Origin/Insertion [20551] 5)  Kenalog 10 mg inj [J3301]
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
## 2010-09-08 NOTE — Assessment & Plan Note (Signed)
Summary: FU TRIGGER FINGER & LEFT FOOT/MC/MJD   Vital Signs:  Patient profile:   55 year old female BP sitting:   137 / 87  Vitals Entered By: April Manson CMA (September 01, 2010 2:27 PM)  Primary Provider:  Myrtis Ser MD   History of Present Illness: 55 yo F f/u Rt 4th trigger finger and Lt 3rd MT avulsion fracture.  Trigger finger - injected 1 month ago.  60% improved.  Not catching nearly as much.  Very pleased with it.  Lt 3rd MT fracture - has been in post op shoe x 1 month.  Pain 50% improved.  Not doing Ca++ and Vit D.  Non smoker.  Allergies: 1)  ! Sulfa 2)  ! Amoxicillin 3)  ! Atenolol 4)  ! * Trimox 5)  ! Tenormin 6)  ! * Septicane  Physical Exam  General:  Well-developed,well-nourished,in no acute distress; alert,appropriate and cooperative throughout examination Msk:  Rt 4th finger - small palpable nodule at A1 pulley that is much smaller.  Unable to actively induce triggering.  Lt 3rd toe/distal foot - still with mild-mod ttp over 4th MTP joint.  No swelling or ecchymosis.  Nl toe ROM, no ttp over toe itself.  MSK [place] Lt foot - distal 3rd MT shows healing callus with increased doppler flow on both long and trans views.  Still with persistent MTP joint effusion, but much smaller. Psych:  odd affect   Impression & Recommendations:  Problem # 1:  TRIGGER FINGER (ICD-727.03) Assessment Improved Improved tremendously after CSI last month  - given her improvement, will concentrate on stretching and ROM exercises.  Doesn't really need referral to hand therapist at this time - f/u as needed, if worsens she will return to clinic or call for hand therapy referral  Problem # 2:  FOOT PAIN, LEFT (ICD-729.5)  improved by 50 % at Lt 3rd MT fx  - callus growth easily seen on [place] - although improved, she is still with some pain, so will keep in post op shoe x 2 more weeks then gradually wean (start with using nl shoes in house, then gradually increase amount of  time out of her post op shoes) - see pt instructions re: Ca++ and Vit D - I expect her to be fully healed within the next month, if no further improvement or worsens, will schedule f/u in 4-6 weeks  Orders: [place] LIMITED ([id number])  Complete Medication List: 1)  Zyrtec 10 Mg Tabs (Cetirizine hcl) .... Take 1 tablet by mouth once a day 2)  Nasonex Susp (Mometasone furoate susp) .... Place 1 spray in each nostril once daily 3)  Astelin 137 Mcg/spray Soln (Azelastine hcl) .... Place 1 spray in each nostril once daily 4)  Bepreve 1.5 % Soln (Bepotastine besilate) .... Use as directed by your ophthalmologist dr. Gershon Crane 5)  Albuterol 90 Mcg/act Aers (Albuterol) .... Inhale 2 puffs every 6 hours as needed for shortness of breath or wheezing 6)  Paxil 20 Mg Tabs (Paroxetine hcl) .... Take 1 and 1/2 tablet every day. 7)  Canasa Supp (Mesalamine supp) .... Use as directed by your gastroenterologist for ulcerative colitis 8)  Aciphex 20 Mg Tbec (Rabeprazole sodium) .... Take one tablet twice a day. 9)  Singulair 10 Mg Tabs (Montelukast sodium) 10)  Lialda 1.2 Gm Tbec (Mesalamine) .... Take one tablet daily. 11)  Qvar 80 Mcg/act Aers (Beclomethasone dipropionate) .... 2 puffs inh daily  Patient Instructions: 1)  Post-op shoe for 2wks 2)  Gradually stop wearing post-op shoe and wear reg shoes around house 3)  Take Calcium 1500-2090m/day 4)  Take Vit D 1000 international units day 5)  Work on range of motion with your finger 6)  Return to clinic  in 4-6 if not better   Orders Added: 1)  Est. Patient Level III [[47583]2)  [place]ITED [[[id number]]

## 2010-10-07 ENCOUNTER — Encounter: Payer: Self-pay | Admitting: Internal Medicine

## 2010-10-07 ENCOUNTER — Ambulatory Visit (INDEPENDENT_AMBULATORY_CARE_PROVIDER_SITE_OTHER): Payer: Self-pay | Admitting: Internal Medicine

## 2010-10-07 VITALS — BP 134/91 | HR 77 | Temp 96.9°F | Ht 63.0 in | Wt 135.5 lb

## 2010-10-07 DIAGNOSIS — E785 Hyperlipidemia, unspecified: Secondary | ICD-10-CM

## 2010-10-07 DIAGNOSIS — K5190 Ulcerative colitis, unspecified, without complications: Secondary | ICD-10-CM

## 2010-10-07 DIAGNOSIS — I10 Essential (primary) hypertension: Secondary | ICD-10-CM

## 2010-10-07 DIAGNOSIS — M6530 Trigger finger, unspecified finger: Secondary | ICD-10-CM

## 2010-10-07 DIAGNOSIS — F429 Obsessive-compulsive disorder, unspecified: Secondary | ICD-10-CM

## 2010-10-07 DIAGNOSIS — R030 Elevated blood-pressure reading, without diagnosis of hypertension: Secondary | ICD-10-CM

## 2010-10-07 MED ORDER — HYDROCHLOROTHIAZIDE 25 MG PO TABS: 12.5000 mg | ORAL_TABLET | Freq: Every day | ORAL | Status: DC

## 2010-10-07 NOTE — Assessment & Plan Note (Signed)
Patient states she's been doing well with this with only certain episodes of counting. She's been stable on her current medication.

## 2010-10-07 NOTE — Assessment & Plan Note (Signed)
Patient has long-standing borderline hypertension both when she comes to clinic visits and also when checking her blood pressures elsewhere. Doshi is a low overall risk I am concerned that her blood pressure over the years may be too elevated. Her Framingham risk that I calculated her last visit was approximately 2%. She does have a family history of heart disease in both her mother and grandmother when they were in their 49s. We will try a low dose of hydrochlorothiazide to see if the patient does not have side effects to this medicine. 2 return in 2 weeks for lab work and repeat blood pressure. Then I will see her in 3 months in June for regular followup appointment.

## 2010-10-07 NOTE — Patient Instructions (Signed)
Take 1/2 tablet of 34m HCTZ daily.   Please follow-up here at the clinic in 2 weeks for BP check and labwork.  You will need to be fasting.   If you experience any side effects from your medication, please stop taking it and call the clinic. Otherwise I will see you again in early June for a follow-up appointment.

## 2010-10-07 NOTE — Assessment & Plan Note (Signed)
Colonoscopy in November of 2011 revealed normal colon.   Per Dr. Collene Mares she is to followup with him for repeat colonoscopy in 5 years otherwise as needed

## 2010-10-07 NOTE — Assessment & Plan Note (Signed)
Last fasting lipid panel showed that the patient was borderline. Will repeat fasting lipid panel in 2 weeks when patient returns for followup blood pressure check. Patient may need to start low-dose statin.

## 2010-10-07 NOTE — Assessment & Plan Note (Signed)
Patient was seen by sports medicine reports that this is doing much better after steroid injection. She is to follow up with them she is a new problems.

## 2010-10-07 NOTE — Progress Notes (Signed)
  Subjective:    Patient ID: Christina Burton, female    DOB: 1956/07/31, 55 y.o.   MRN: [id number]  HPI  Patient presents today for follow-up after seeing sports medicine for her trigger finger which she says is much better after the steroid injection.  Last august left toe got a cramp but now it is a little worse and sports medicine said that she had dislocated her toe but that it went back into place by herself.   - advised to take 2058m calcium and 1007mVitamin D to help this heal.  The toe is better but not 100% at this point.  She has been compliant with the orthopedic shoe when at home and it helps a great deal.  LMP in September - told by gynecologist she is going through menopause.  No complaints of hot flashes.  Colonoscopy in November was wnl. Recommendation to follow-up in 5 years.    Patient has been trying to eat less salt given that her BPs have been borderline when she checks it in drugstores, etc.    Review of Systems  Constitutional: Negative for fever and chills.  Respiratory: Positive for cough. Negative for shortness of breath.   Cardiovascular: Positive for palpitations. Negative for chest pain and leg swelling.  Genitourinary: Negative for dysuria, frequency and vaginal bleeding.  Neurological: Negative for weakness and numbness.       Objective:   Physical Exam  Constitutional: She is oriented to person, place, and time. She appears well-developed and well-nourished. No distress.  HENT:  Head: Normocephalic and atraumatic.  Mouth/Throat: No oropharyngeal exudate.  Eyes: Conjunctivae and EOM are normal. Pupils are equal, round, and reactive to light.  Neck: Normal range of motion. Neck supple.  Cardiovascular: Normal rate, regular rhythm and intact distal pulses.   No murmur heard. Pulmonary/Chest: Effort normal and breath sounds normal. She has no wheezes.  Abdominal: Soft. Bowel sounds are normal. She exhibits no distension and no mass. There is no  tenderness. There is no rebound and no guarding.  Musculoskeletal: Normal range of motion. She exhibits no edema and no tenderness.  Neurological: She is alert and oriented to person, place, and time. No cranial nerve deficit. Coordination normal.  Skin: Skin is warm and dry. No rash noted.  Psychiatric: She has a normal mood and affect. Her behavior is normal. Judgment and thought content normal.          Assessment & Plan:

## 2010-10-28 ENCOUNTER — Ambulatory Visit: Payer: Self-pay | Admitting: Internal Medicine

## 2010-10-29 ENCOUNTER — Ambulatory Visit (INDEPENDENT_AMBULATORY_CARE_PROVIDER_SITE_OTHER): Payer: Self-pay | Admitting: Internal Medicine

## 2010-10-29 ENCOUNTER — Encounter: Payer: Self-pay | Admitting: Internal Medicine

## 2010-10-29 VITALS — BP 125/86 | HR 82 | Temp 97.6°F | Resp 20 | Ht 63.0 in | Wt 132.7 lb

## 2010-10-29 DIAGNOSIS — R030 Elevated blood-pressure reading, without diagnosis of hypertension: Secondary | ICD-10-CM

## 2010-10-29 LAB — COMPREHENSIVE METABOLIC PANEL
BUN: 15 mg/dL (ref 6–23)
CO2: 27 mEq/L (ref 19–32)
Calcium: 9.9 mg/dL (ref 8.4–10.5)
Chloride: 102 mEq/L (ref 96–112)
Creat: 0.63 mg/dL (ref 0.40–1.20)

## 2010-10-29 LAB — LIPID PANEL
HDL: 69 mg/dL (ref >39)
LDL Cholesterol: 143 mg/dL — ABNORMAL HIGH (ref 0–99)
Total CHOL/HDL Ratio: 3.3 Ratio
Triglycerides: 84 mg/dL (ref <150)
VLDL: 17 mg/dL (ref 0–40)

## 2010-10-29 LAB — CBC
HCT: 41 % (ref 36.0–46.0)
Hemoglobin: 14.9 g/dL (ref 12.0–15.0)
RBC: 4.93 MIL/uL (ref 3.87–5.11)
WBC: 7.3 10*3/uL (ref 4.0–10.5)

## 2010-10-29 NOTE — Progress Notes (Signed)
Subjective:    Patient ID: Christina Burton, female    DOB: 01/19/1956, 55 y.o.   MRN: [id number]  HPI Patient is a 55 years old female with past medical history  as outlined here who comes to the Clinic for f/u her BP and left ear fullness. During her last vist, she was started HCTZ 12.5 mg and has been doing well. No c/o, including fever, chill, chest pain, shortness of breath, hemoptysis, abdominal pain, nausea, vomiting, diarrhea, melena, dysuria, significant weight change. She also has left ear fullness and no hearing loss or drainage. Denies recent smoking, alcohol or drug abuse. Has been taking all his medications as instructed.     Review of Systems Per HPI.  Current Outpatient Medications Current Outpatient Prescriptions  Medication Sig Dispense Refill  . albuterol (PROVENTIL,VENTOLIN) 90 MCG/ACT inhaler Inhale 2 puffs into the lungs every 6 (six) hours as needed. For shortness of breath or wheezing       . azelastine (ASTELIN) 137 MCG/SPRAY nasal spray 1 spray by Nasal route daily. Use in each nostril as directed       . beclomethasone (QVAR) 80 MCG/ACT inhaler Inhale 2 puffs into the lungs daily.        . Bepotastine Besilate (BEPREVE) 1.5 % SOLN Use as directed by your ophthalmologist, Dr. Gershon Crane       . hydrochlorothiazide 25 MG tablet Take 0.5 tablets (12.5 mg total) by mouth daily.  30 tablet  3  . mesalamine (CANASA) 1000 MG suppository Use as directed by your gastroenterologist for ulcerative colitis       . mesalamine (LIALDA) 1.2 G EC tablet Take 1,200 mg by mouth daily.        . mometasone (NASONEX) 50 MCG/ACT nasal spray 1 spray by Nasal route daily.        . montelukast (SINGULAIR) 10 MG tablet        . PARoxetine (PAXIL) 20 MG tablet Take 30 mg by mouth daily.       . RABEprazole (ACIPHEX) 20 MG tablet Take 20 mg by mouth 2 (two) times daily.        . cetirizine (ZYRTEC) 10 MG tablet Take 10 mg by mouth daily.        Marland Kitchen DISCONTD: hydrochlorothiazide 25 MG tablet Take  12.5 mg by mouth daily.          Allergies Amoxicillin; Articaine; Atenolol; and Sulfonamide derivatives  Past Medical History  Diagnosis Date  . Sinusitis     chronic  . Mitral valve prolapse   . Ulcerative colitis     sees Dr. Collene Mares - normal colonoscopy in Nov 2011  . Anxiety disorder   . Obsessive compulsive disorder   . Allergic rhinitis     Past Surgical History  Procedure Date  . Carpal tunnel release   . Hernia repair   . Trigger finger release     right thumb  . Tonsilectomy, adenoidectomy, bilateral myringotomy and tubes        Objective:   Physical Exam General: Vital signs reviewed and noted. Well-developed,well-nourished,in no acute distress; alert,appropriate and cooperative throughout examination. Head: normocephalic, atraumatic. Wax is seen in left ear, no drainage or erythema.  Neck: No deformities, masses, or tenderness noted. Lungs: Normal respiratory effort. Clear to auscultation BL without crackles or wheezes.  Heart: RRR. S1 and S2 normal without gallop, murmur, or rubs.  Abdomen: BS normoactive. Soft, Nondistended, non-tender.  No masses or organomegaly. Extremities: No pretibial edema.  Assessment & Plan:

## 2010-10-29 NOTE — Patient Instructions (Signed)
Please take all your medications as instructed in your instructions.   We will call you if any abnormal lab results.

## 2010-10-29 NOTE — Assessment & Plan Note (Signed)
Lab Results  Component Value Date   NA 139 12/01/2009   K 3.7 12/01/2009   CL 103 12/01/2009   CO2 23 12/01/2009   BUN 12 12/01/2009   CREATININE 0.60 12/01/2009    BP Readings from Last 3 Encounters:  10/29/10 125/86  10/07/10 134/91  09/01/10 137/86   BP well controlled with low dose HCTZ and will continue this and she will have lab drawn after this visit.

## 2010-11-01 ENCOUNTER — Encounter: Payer: Self-pay | Admitting: *Deleted

## 2010-12-17 NOTE — Op Note (Signed)
   NAME:  Christina Burton, Christina Burton                        ACCOUNT NO.:  [id number]   MEDICAL RECORD NO.:  [id number]                   PATIENT TYPE:  AMB   LOCATION:  ENDO                                 FACILITY:  [facility]   PHYSICIAN:  Nelwyn Salisbury, M.D.               DATE OF BIRTH:  October 26, 1955   DATE OF PROCEDURE:  12/23/2002  DATE OF DISCHARGE:                                 OPERATIVE REPORT   PROCEDURE:  Colonoscopy with biopsy.   ENDOSCOPIST:  Juanita Craver, M.D.   INSTRUMENT USED:  Olympus video colonoscope.   INDICATIONS FOR PROCEDURE:  A 55 year old white female with a previous  history of ulcerative colitis undergoing a repeat colonoscopy because of  significant recurrent rectal bleeding.  The patient's lab work has been  unrevealing, however, with normal CBC and ESR.   PROCEDURE PERFORMED:  Informed consent was procured from the patient.  The  patient fasted for eight hours prior to the procedure and prepped with a  bottle of magnesium citrate and a gallon of GOLYTELY the night prior to the  procedure.   PREPROCEDURE PHYSICAL EXAMINATION:  VITAL SIGNS:  The patient had stable  vital signs.  NECK:  Supple.  CHEST:  Clear to auscultation.  HEART:  S1 and S2 regular.  ABDOMEN:  Soft with normal bowel sounds.   DESCRIPTION OF PROCEDURE:  The patient was placed in the left lateral  decubitus position, sedated with 50 mg of Demerol and 5 mg of Versed  intravenously.  Once the patient was adequately sedated and maintained on  low flow oxygen, continuous cardiac monitoring, the Olympus video  colonoscope was advanced from the rectum to the cecum and terminal ileum  without difficulty.  Mild inflammatory changes were noted in the rectal  mucosa and in the rectosigmoid.  This was biopsied to rule out colitis.  Patchy erythema with erosions was noted in the right colon including the  cecum.  Biopsies were done from here as well.  The rest of the colonic  mucosa including the terminal  ileum appeared normal.  The patient tolerated  the procedure well without complications.   IMPRESSION:  Erosions in the cecum and proximal right colon and also in  rectosigmoid area and rectum biopsied for pathology pending.    RECOMMENDATIONS:  1. Await pathology results.  2. Continue present medications.  3. Outpatient followup in the next two weeks for further recommendations.                                               Nelwyn Salisbury, M.D.    JNM/MEDQ  D:  12/23/2002  T:  12/24/2002  Job:  [id number]   cc:   [facility]

## 2010-12-28 ENCOUNTER — Ambulatory Visit (INDEPENDENT_AMBULATORY_CARE_PROVIDER_SITE_OTHER): Payer: Self-pay | Admitting: Internal Medicine

## 2010-12-28 ENCOUNTER — Encounter: Payer: Self-pay | Admitting: Internal Medicine

## 2010-12-28 VITALS — BP 137/93 | HR 90 | Temp 98.6°F | Ht 63.0 in | Wt 130.8 lb

## 2010-12-28 DIAGNOSIS — F429 Obsessive-compulsive disorder, unspecified: Secondary | ICD-10-CM

## 2010-12-28 DIAGNOSIS — K5190 Ulcerative colitis, unspecified, without complications: Secondary | ICD-10-CM

## 2010-12-28 DIAGNOSIS — M79609 Pain in unspecified limb: Secondary | ICD-10-CM

## 2010-12-28 DIAGNOSIS — M25519 Pain in unspecified shoulder: Secondary | ICD-10-CM

## 2010-12-28 DIAGNOSIS — M25511 Pain in right shoulder: Secondary | ICD-10-CM | POA: Insufficient documentation

## 2010-12-28 DIAGNOSIS — G4700 Insomnia, unspecified: Secondary | ICD-10-CM | POA: Insufficient documentation

## 2010-12-28 DIAGNOSIS — I10 Essential (primary) hypertension: Secondary | ICD-10-CM

## 2010-12-28 DIAGNOSIS — E785 Hyperlipidemia, unspecified: Secondary | ICD-10-CM

## 2010-12-28 DIAGNOSIS — R030 Elevated blood-pressure reading, without diagnosis of hypertension: Secondary | ICD-10-CM

## 2010-12-28 MED ORDER — HYDROCHLOROTHIAZIDE 25 MG PO TABS: 25.0000 mg | ORAL_TABLET | Freq: Every day | ORAL | Status: DC

## 2010-12-28 MED ORDER — PRAVASTATIN SODIUM 20 MG PO TABS: 20.0000 mg | ORAL_TABLET | Freq: Every evening | ORAL | Status: DC

## 2010-12-28 MED ORDER — PAROXETINE HCL 20 MG PO TABS: 40.0000 mg | ORAL_TABLET | Freq: Every day | ORAL | Status: DC

## 2010-12-28 NOTE — Assessment & Plan Note (Signed)
Patient continues to be suboptimal control today with a diastolic blood pressure above goal. We'll increase her hydrochlorothiazide to 25 mg by mouth daily and check BMet in 2 weeks. Of note the patient does have a history of a sulfa allergy which she states gives her a rash. However she had any of this problems with this since starting hydrochlorothiazide therefore we will maintain this medicine for now.

## 2010-12-28 NOTE — Patient Instructions (Signed)
Today you had an injection of your right shoulder.  Watch for any signs of increasing redness, pain, or inability to move the joint or fevers or chills, please call the clinic or go to the emergency room. You have a referral to the sports medicine providers to evaluate your shoulder and toe. You will need to return in 2 weeks for labwork - I will call you if the results are abnormal.  Otherwise return in 3 months for a blood pressure check. I have started you on a cholesterol medication today; it is called pravastatin. Take 1 tablet by mouth every night.  If you experience new muscle pains, please stop the pill and call the clinic. Your paxil has been increased to 2 tablets daily. Your HCTZ is now 1 whole tablet daily. Continue the good work with your diet and hopefully one day you will consider adding exercise.

## 2010-12-28 NOTE — Progress Notes (Signed)
Subjective:    Patient ID: Christina Burton, female    DOB: 1955-10-19, 55 y.o.   MRN: [id number]  HPI Comments: Patient is a 55 year old woman who returns for regular followup. His chief complaint of right-sided shoulder pain which she states started approximately 4 weeks ago. This was not preceded by any fall or injury. She's had some weakness on that side but nothing to restrict her activities. The pain is generally located around her shoulder joint but she has had some pain in the muscle belly of her right upper extremity over the biceps, triceps, and the forearm. She has a history of elbow tendinitis and carpal tunnel on that side, but she states these problems if not bothered her recently. She's tried taking Tylenol in the morning and the evening has not noticed much relief with this.  Patient wonders if she needs to be seen by sports medicine for her shoulder problem, and she thinks maybe that idea anyway she'd like to have her toe evaluated. She says her toes was a chronic problem in the past and dislocation has been doing well she was just like to be reassured.  She also she's had some trouble sleeping she's tried taking melatonin 3 mg by mouth every evening with some success. She states her problem is mostly that she can't stay asleep when she falls asleep. She does have poor sleep hygiene and she typically watches TV rapid to going to bed. She does not drink caffeinated beverages per to going to sleep.  She states she has been compliant with her blood pressure medications and she is disappointed today her blood pressure medicines remain elevated. History of obsessive compulsive disorder and states that she has been noticing less problems with counting she has been checking frequently and was interested in going up on her Paxil. She tolerated this medicine quite well.  Patient has a history of ulcerative colitis and she is followed by gastroenterology for this. She was diagnosed with this in  approx 1996. She's had colonoscopy every 2 years.  She states she is due to followup with her gastroenterologists in the fall.    Hypertension Pertinent negatives include no chest pain, headaches, palpitations or shortness of breath.  Shoulder Pain  Pertinent negatives include no fever or numbness.      Review of Systems  Constitutional: Negative for fever, chills, diaphoresis and unexpected weight change.  HENT: Negative for congestion, sore throat and trouble swallowing.        Pt does complain of occasional metallic taste in mouth  Eyes: Negative for visual disturbance.  Respiratory: Negative for cough, chest tightness and shortness of breath.   Cardiovascular: Negative for chest pain, palpitations and leg swelling.  Gastrointestinal: Negative for nausea, vomiting, abdominal pain, diarrhea, constipation and blood in stool.  Genitourinary: Negative for dysuria, urgency, frequency and difficulty urinating.  Musculoskeletal: Positive for arthralgias. Negative for myalgias.  Skin: Negative for rash.  Neurological: Negative for dizziness, weakness, numbness and headaches.  Hematological: Negative for adenopathy.  Psychiatric/Behavioral: Negative for behavioral problems, confusion, dysphoric mood and agitation.  All other systems reviewed and are negative.       Objective:   Physical Exam  Constitutional: She is oriented to person, place, and time. She appears well-developed and well-nourished. No distress.  HENT:  Head: Normocephalic and atraumatic.  Mouth/Throat: No oropharyngeal exudate.  Eyes: Conjunctivae and EOM are normal. Pupils are equal, round, and reactive to light.  Neck: Normal range of motion. Neck supple.  Cardiovascular: Normal rate,  regular rhythm and intact distal pulses.   No murmur heard. Pulmonary/Chest: Effort normal and breath sounds normal. She has no wheezes.  Abdominal: Soft. Bowel sounds are normal. She exhibits no distension and no mass. There is no  tenderness. There is no rebound and no guarding.  Musculoskeletal:       Decreased ROM of right shoulder with internal and external rotation. Mild pain with elevation of extended RUE past 100 degrees.  Mild pain to palpation along anterior and lateral shoulder.  No decreased sensation or numbness with movement of the arm.  Elbow and wrist on right not tender.  Left shoulder WNL.  Neurological: She is alert and oriented to person, place, and time. No cranial nerve deficit. Coordination normal.  Skin: Skin is warm and dry. No rash noted.  Psychiatric: She has a normal mood and affect. Her behavior is normal. Judgment and thought content normal.          Assessment & Plan:

## 2010-12-28 NOTE — Assessment & Plan Note (Signed)
This was secondary to a dislocated left third toe. She [facility] for this. She states she is doing much better but she is looking forward to following up with them about this at the same time that she sees them to evaluate her right shoulder.

## 2010-12-28 NOTE — Assessment & Plan Note (Signed)
Today we discussed good sleep hygiene habits. I also recommend the patient may continue melatonin up to 10 mg a day.

## 2010-12-28 NOTE — Assessment & Plan Note (Signed)
Given the patient's symptoms and physical exam I suspect this is most likely related to bursitis though problems with her right rotator cuff cannot be excluded. Today performed a kenalog/lidocaine (65m/1cc of 1%) under the supervision of Dr. KEppie Gibsonwith 40 mg of Kenalog with 1 mL of lidocaine.  This will be not only therapeutic but also diagnostic.If in fact her pain was not relieved with the injection, I suspect she may have more of a rotator cuff problem. I have also written for a referral for the patient to sports medicine for evaluation of her right shoulder..Marland Kitchen

## 2010-12-28 NOTE — Assessment & Plan Note (Signed)
Patient is followed closely by gastroenterology for this. She is followed up in the fall.

## 2010-12-28 NOTE — Assessment & Plan Note (Signed)
The patient states that she's was doing better on the 30 mg daily but she feels that she still is some room for improvement therefore we'll increase her Paxil to 2 tablets by mouth daily. She states the keeping her tablets of 20 mg is cheaper than going up to 40 mg.

## 2010-12-28 NOTE — Assessment & Plan Note (Signed)
Given that her cholesterol remains elevated despite diet changes we will start a low-dose statin today. I will check a C. met with her blood pressure labs in 2 weeks to be sure that her liver functions have been stable. We will start with pravastatin 20 mg by mouth daily. She will need a repeat fasting lipid panel in 3 months for followup.

## 2011-01-11 ENCOUNTER — Other Ambulatory Visit: Payer: Self-pay

## 2011-01-11 DIAGNOSIS — I10 Essential (primary) hypertension: Secondary | ICD-10-CM

## 2011-01-11 LAB — COMPREHENSIVE METABOLIC PANEL
ALT: 18 U/L (ref 0–35)
CO2: 31 mEq/L (ref 19–32)
Calcium: 10.4 mg/dL (ref 8.4–10.5)
Chloride: 99 mEq/L (ref 96–112)
Potassium: 3.6 mEq/L (ref 3.5–5.3)
Sodium: 141 mEq/L (ref 135–145)
Total Protein: 7.2 g/dL (ref 6.0–8.3)

## 2011-01-18 ENCOUNTER — Inpatient Hospital Stay (INDEPENDENT_AMBULATORY_CARE_PROVIDER_SITE_OTHER)
Admission: RE | Admit: 2011-01-18 | Discharge: 2011-01-18 | Disposition: A | Discharge status: Home / Self Care | Payer: Self-pay | Source: Ambulatory Visit | Attending: Emergency Medicine | Admitting: Emergency Medicine

## 2011-01-18 ENCOUNTER — Emergency Department (HOSPITAL_COMMUNITY)
Admission: EM | Admit: 2011-01-18 | Discharge: 2011-01-19 | Disposition: A | Discharge status: Home / Self Care | Payer: Self-pay | Attending: Emergency Medicine | Admitting: Emergency Medicine

## 2011-01-18 DIAGNOSIS — M25539 Pain in unspecified wrist: Secondary | ICD-10-CM | POA: Insufficient documentation

## 2011-01-18 DIAGNOSIS — R42 Dizziness and giddiness: Secondary | ICD-10-CM | POA: Insufficient documentation

## 2011-01-18 DIAGNOSIS — IMO0002 Reserved for concepts with insufficient information to code with codable children: Secondary | ICD-10-CM

## 2011-01-18 DIAGNOSIS — M79609 Pain in unspecified limb: Secondary | ICD-10-CM | POA: Insufficient documentation

## 2011-01-18 DIAGNOSIS — I10 Essential (primary) hypertension: Secondary | ICD-10-CM | POA: Insufficient documentation

## 2011-01-18 DIAGNOSIS — E789 Disorder of lipoprotein metabolism, unspecified: Secondary | ICD-10-CM | POA: Insufficient documentation

## 2011-01-18 DIAGNOSIS — S40029A Contusion of unspecified upper arm, initial encounter: Secondary | ICD-10-CM | POA: Insufficient documentation

## 2011-01-18 DIAGNOSIS — R296 Repeated falls: Secondary | ICD-10-CM | POA: Insufficient documentation

## 2011-01-18 DIAGNOSIS — R55 Syncope and collapse: Secondary | ICD-10-CM | POA: Insufficient documentation

## 2011-01-18 LAB — POCT I-STAT, CHEM 8
Hemoglobin: 13.6 g/dL (ref 12.0–15.0)
Sodium: 139 mEq/L (ref 135–145)
TCO2: 27 mmol/L (ref 0–100)

## 2011-01-26 ENCOUNTER — Ambulatory Visit (INDEPENDENT_AMBULATORY_CARE_PROVIDER_SITE_OTHER): Payer: Self-pay | Admitting: Family Medicine

## 2011-01-26 DIAGNOSIS — S43499A Other sprain of unspecified shoulder joint, initial encounter: Secondary | ICD-10-CM

## 2011-01-26 DIAGNOSIS — S60229A Contusion of unspecified hand, initial encounter: Secondary | ICD-10-CM

## 2011-01-26 DIAGNOSIS — M79609 Pain in unspecified limb: Secondary | ICD-10-CM

## 2011-01-26 DIAGNOSIS — M6530 Trigger finger, unspecified finger: Secondary | ICD-10-CM

## 2011-01-26 DIAGNOSIS — S46119A Strain of muscle, fascia and tendon of long head of biceps, unspecified arm, initial encounter: Secondary | ICD-10-CM

## 2011-01-26 NOTE — Progress Notes (Signed)
  Subjective:    Patient ID: Christina Burton, female    DOB: 01-03-1956, 55 y.o.   MRN: [id number]  HPI 55 yo F here for Rt shoulder pain.  Present x 6 weeks.  Feels some tugging/pulling, worse with turning her hand over back and forth.  Received CSI from IM clinic 1 month ago, Did very well without issue for next 3 weeks, but then 1 week ago had episode when she turned door knob and felt a "pop" and had immediate pain in upper arm.  Went to urgent care, and then referred to [facility] the next day, saw Dr. Amedeo Plenty and told it was BT rupture.  Had MRI as well, but I don't have these results.  States her arm was swollen and bruised.  Her arm is now in a sling.  Pain is improved, but still hurts with movement. I previously injected Rt 4th trigger finger on her, which is now greatly improved. In addition she had a prior Lt 3rd toe fx found on [place].  We treated conservatively with post op shoe x 1 month, and this got significantly better.  She would like it rechecked. In addition, it sounds like over the weekend she had syncope episode and fell on her Lt hand.  Was taken to ER, but states her syncope work up was negative.  Review of Systems Denies F/C/S    Objective:   Physical Exam Gen: NAD Psych: odd affect, speaks rather fast RUE: sling removed.  She has extensive ecchymosis in upper arm extending to elbow and forearm.  + popeye deformity compared to left arm.  + ttp over bic groove and biceps muscle.  Fairly good ROM of elbow with mild-mod pain. Rt hand: no active triggering Lt foot: no ttp over 3rd toe Lt  Hand: mild soft tissue ttp.  No bony ttp over metacarpals or fingers.  Good alignment without rotational deformity.  MSK [place]: Rt shoulder shows empty bicipital groove with halo sign on trans view.  Long view shows absence of BT in groove and tendon retracted into distal arm. LT foot shows nl appearance of Lt 3rd toe with minimal edema.       Assessment & Plan:  Rt BT rupture - has f/u  scheduled with Dr. 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  However I also told her that Dr. Amedeo Plenty is an excellent hand surgeon and she is welcome to continue seeing him.  Hopefully, her pain will subside and she won't require tenotomy.  Rt 4th trigger finger - resolved with prior CSI  Lt 3rd toe fx - healed clinically and on [place] - requires no further f/u  Lt hand contusion - conservative care - reassured no fx apparent at this time  She can f/u with [place] in 1-2 months if desired.

## 2011-01-27 DIAGNOSIS — S46119A Strain of muscle, fascia and tendon of long head of biceps, unspecified arm, initial encounter: Secondary | ICD-10-CM | POA: Insufficient documentation

## 2011-01-27 DIAGNOSIS — S60229A Contusion of unspecified hand, initial encounter: Secondary | ICD-10-CM | POA: Insufficient documentation

## 2011-01-28 ENCOUNTER — Encounter: Payer: Self-pay | Admitting: Family Medicine

## 2011-03-01 ENCOUNTER — Encounter: Payer: Self-pay | Admitting: Family Medicine

## 2011-03-01 ENCOUNTER — Ambulatory Visit (INDEPENDENT_AMBULATORY_CARE_PROVIDER_SITE_OTHER): Payer: Self-pay | Admitting: Family Medicine

## 2011-03-01 VITALS — BP 115/79 | HR 80

## 2011-03-01 DIAGNOSIS — S46119A Strain of muscle, fascia and tendon of long head of biceps, unspecified arm, initial encounter: Secondary | ICD-10-CM

## 2011-03-01 DIAGNOSIS — S43499A Other sprain of unspecified shoulder joint, initial encounter: Secondary | ICD-10-CM

## 2011-03-01 NOTE — Progress Notes (Signed)
Christina Burton, a 55 y.o. female presents today in the office for the following:    Pleasant patient who had a right proximal biceps rupture approaching 6 weeks ago, she has some swelling, bruising, was briefly immobilized in a sling, and has been favoring this arm since then. She has had significant resolution of swelling and bruising. Only minimal bruising at this point around the elbow.  Been doing some very slight degree of range of motion exercises, but no other rehabilitation yet. Pain is much better.  Patient Active Problem List  Diagnoses  . HYPERLIPIDEMIA  . ANXIETY DISORDER  . OBSESSIVE-COMPULSIVE DISORDER  . MITRAL VALVE PROLAPSE  . SINUSITIS, CHRONIC  . ALLERGIC RHINITIS  . ULCERATIVE COLITIS  . DEGENERATIVE JOINT DISEASE, FINGERS  . TRIGGER FINGER  . Hypertension  . CARPAL TUNNEL RELEASE, HX OF  . FOOT PAIN, LEFT  . Right shoulder pain  . Insomnia  . Biceps tendon rupture, proximal  . Hand contusion   Past Medical History  Diagnosis Date  . Sinusitis     chronic  . Mitral valve prolapse   . Ulcerative colitis     sees Dr. Collene Mares - normal colonoscopy in Nov 2011  . Anxiety disorder   . Obsessive compulsive disorder   . Allergic rhinitis    Past Surgical History  Procedure Date  . Carpal tunnel release   . Hernia repair   . Trigger finger release     right thumb  . Tonsilectomy, adenoidectomy, bilateral myringotomy and tubes    History  Substance Use Topics  . Smoking status: Never Smoker   . Smokeless tobacco: Never Used  . Alcohol Use: No   Family History  Problem Relation Age of Onset  . Hypertension Mother   . Hyperlipidemia Mother   . Diabetes Brother   . Diabetes Maternal Grandmother    Allergies  Allergen Reactions  . Amoxicillin   . Articaine     Caused numbness that too long - lasted about 2 months when used by dentist  . Atenolol   . Sulfonamide Derivatives     REACTION: Unknown reaction   Current Outpatient Prescriptions on File  Prior to Visit  Medication Sig Dispense Refill  . albuterol (PROVENTIL,VENTOLIN) 90 MCG/ACT inhaler Inhale 2 puffs into the lungs every 6 (six) hours as needed. For shortness of breath or wheezing       . azelastine (ASTELIN) 137 MCG/SPRAY nasal spray 1 spray by Nasal route daily. Use in each nostril as directed       . beclomethasone (QVAR) 80 MCG/ACT inhaler Inhale 2 puffs into the lungs daily.        . Bepotastine Besilate (BEPREVE) 1.5 % SOLN Use as directed by your ophthalmologist, Dr. Gershon Crane       . cetirizine (ZYRTEC) 10 MG tablet Take 10 mg by mouth daily.        . hydrochlorothiazide 25 MG tablet Take 1 tablet (25 mg total) by mouth daily.  30 tablet  3  . mesalamine (CANASA) 1000 MG suppository Use as directed by your gastroenterologist for ulcerative colitis       . mesalamine (LIALDA) 1.2 G EC tablet Take 1,200 mg by mouth daily.        . mometasone (NASONEX) 50 MCG/ACT nasal spray 1 spray by Nasal route daily.        . montelukast (SINGULAIR) 10 MG tablet        . PARoxetine (PAXIL) 20 MG tablet Take 2 tablets (40 mg  total) by mouth daily.  60 tablet  5  . pravastatin (PRAVACHOL) 20 MG tablet Take 1 tablet (20 mg total) by mouth every evening.  30 tablet  11  . RABEprazole (ACIPHEX) 20 MG tablet Take 20 mg by mouth 2 (two) times daily.         REVIEW OF SYSTEMS  GEN: No fevers, chills. Nontoxic. Primarily MSK c/o today. MSK: Detailed in the HPI GI: tolerating PO intake without difficulty Neuro: No numbness, parasthesias, or tingling associated. Otherwise the pertinent positives of the ROS are noted above.     Physical Exam  Blood pressure 115/79, pulse 80, last menstrual period 03/01/2010.  GEN: WDWN, NAD, Non-toxic, A & O x 3 HEENT: Atraumatic, Normocephalic. Neck supple. No masses, No LAD. Ears and Nose: No external deformity. EXTR: No c/c/e NEURO Normal gait.  PSYCH: Normally interactive. Conversant. Not depressed or anxious appearing.  Calm demeanor.   Right  shoulder: Full range of motion at the shoulder as well as full flexion and pronation and suffocation at the elbow on the right. There is a small palpable defect in the proximal lateral biceps. Strength is preserved at 4/5. Grossly nontender to palpation no bruising with the exception of a fading bruise at the proximal forearm.  Mild tenderness at the [facility] joint on the right as well with mild crepitation.  Diagnostic Ultrasound Evaluation [employer] E, MSK ultrasound, MSK probe Anatomy scanned: R biceps Indication: Pain Findings: Disrupted muscle fibers and hypoechoic area consistent with scarring versus tendon seen at the proximal biceps area distally fibers appeared intact.  Assessment and plan: Biceps tendon rupture, proximal: advance rehabilitation. Gave basic elbow range of motion and strengthening program, to begin with no way, then advance to a can of soup or beans initially. Focus on flexion and pronation and supination.  CMC arthritis, mild exacerbation secondary to fall, reassured the patient

## 2011-04-14 ENCOUNTER — Ambulatory Visit (INDEPENDENT_AMBULATORY_CARE_PROVIDER_SITE_OTHER): Payer: Self-pay | Admitting: Family Medicine

## 2011-04-14 VITALS — BP 115/80

## 2011-04-14 DIAGNOSIS — S46119A Strain of muscle, fascia and tendon of long head of biceps, unspecified arm, initial encounter: Secondary | ICD-10-CM

## 2011-04-14 DIAGNOSIS — S43499A Other sprain of unspecified shoulder joint, initial encounter: Secondary | ICD-10-CM

## 2011-04-14 NOTE — Progress Notes (Signed)
  Subjective:    Patient ID: Christina Burton, female    DOB: 1956-05-02, 55 y.o.   MRN: [id number]  HPI  Christina Burton, a 55 y.o. female presents today in the office for the following:    Here in f/u of R proximal biceps rupture. Has continued to progress Hit or miss on doing rehab, but doing some of the time.  Feels OK, str improving. Flared up a couple of weeks ago when trimming hedges  03/01/2011 OV  Pleasant patient who had a right proximal biceps rupture approaching 6 weeks ago, she has some swelling, bruising, was briefly immobilized in a sling, and has been favoring this arm since then. She has had significant resolution of swelling and bruising. Only minimal bruising at this point around the elbow.  Been doing some very slight degree of range of motion exercises, but no other rehabilitation yet. Pain is much better.   Review of Systems REVIEW OF SYSTEMS  GEN: No fevers, chills. Nontoxic. Primarily MSK c/o today. MSK: Detailed in the HPI GI: tolerating PO intake without difficulty Neuro: No numbness, parasthesias, or tingling associated. Otherwise the pertinent positives of the ROS are noted above.      Objective:   Physical Exam   Physical Exam  Last menstrual period 03/01/2010.  GEN: WDWN, NAD, Non-toxic, A & O x 3 HEENT: Atraumatic, Normocephalic. Neck supple. No masses, No LAD. Ears and Nose: No external deformity. EXTR: No c/c/e NEURO Normal gait.  PSYCH: Normally interactive. Conversant. Not depressed or anxious appearing.  Calm demeanor.    Str R 4++/5 flexion and supination compared to L All others 5/5 Minimally ttp Mild hawkins     Assessment & Plan:   1. Biceps tendon rupture, proximal     Doing better. Discussed her rehab, getting 2 pound weight and long term   Mild cuff tendinopathy. Likely secondary  F/u prn Cont HEP 3-4 d week

## 2011-04-28 ENCOUNTER — Encounter: Payer: Self-pay | Admitting: Internal Medicine

## 2011-05-12 ENCOUNTER — Ambulatory Visit (INDEPENDENT_AMBULATORY_CARE_PROVIDER_SITE_OTHER): Payer: Self-pay | Admitting: *Deleted

## 2011-05-12 DIAGNOSIS — Z23 Encounter for immunization: Secondary | ICD-10-CM

## 2011-05-17 ENCOUNTER — Ambulatory Visit: Payer: Self-pay

## 2011-05-26 ENCOUNTER — Ambulatory Visit (INDEPENDENT_AMBULATORY_CARE_PROVIDER_SITE_OTHER): Payer: Self-pay | Admitting: Family Medicine

## 2011-05-26 DIAGNOSIS — M719 Bursopathy, unspecified: Secondary | ICD-10-CM

## 2011-05-26 DIAGNOSIS — M67919 Unspecified disorder of synovium and tendon, unspecified shoulder: Secondary | ICD-10-CM

## 2011-05-26 DIAGNOSIS — M7580 Other shoulder lesions, unspecified shoulder: Secondary | ICD-10-CM | POA: Insufficient documentation

## 2011-05-26 MED ORDER — TRAMADOL HCL 50 MG PO TABS: 50.0000 mg | ORAL_TABLET | Freq: Four times a day (QID) | ORAL | Status: AC | PRN

## 2011-05-26 NOTE — Patient Instructions (Signed)
F/u 6 weeks

## 2011-05-26 NOTE — Progress Notes (Signed)
Pt's physical therapy referral sent to [facility] on [address].

## 2011-05-27 ENCOUNTER — Encounter: Payer: Self-pay | Admitting: Family Medicine

## 2011-05-27 NOTE — Progress Notes (Signed)
  Subjective:    Patient ID: Christina Burton, female    DOB: 12-15-1955, 55 y.o.   MRN: [id number]  HPI  Pleasant patient, follow up RIGHT proximal biceps rupture he was actually doing well with regards to her biceps functionality is straight, but she has developed some secondary shoulder pain and pain with abduction, reaching across the body and pain with internal range of motion. This progressed from the last time I saw her until the day. She's been trying to do some range of motion and home exercise program, but with difficulty.  04/14/2011 OV  Here in f/u of R proximal biceps rupture. Has continued to progress Hit or miss on doing rehab, but doing some of the time.   Feels OK, str improving. Flared up a couple of weeks ago when trimming hedges  03/01/2011 OV  Pleasant patient who had a right proximal biceps rupture approaching 6 weeks ago, she has some swelling, bruising, was briefly immobilized in a sling, and has been favoring this arm since then. She has had significant resolution of swelling and bruising. Only minimal bruising at this point around the elbow.  Been doing some very slight degree of range of motion exercises, but no other rehabilitation yet. Pain is much better. Christina Burton, a 55 y.o. female presents today in the office for the following:    Pleasant patient who had a right proximal biceps rupture approaching 6 weeks ago, she has some swelling, bruising, was briefly immobilized in a sling, and has been favoring this arm since then. She has had significant resolution of swelling and bruising. Only minimal bruising at this point around the elbow.  Been doing some very slight degree of range of motion exercises, but no other rehabilitation yet. Pain is much better.   Review of Systems REVIEW OF SYSTEMS  GEN: No fevers, chills. Nontoxic. Primarily MSK c/o today. MSK: Detailed in the HPI GI: tolerating PO intake without difficulty Neuro: No numbness, parasthesias,  or tingling associated. Otherwise the pertinent positives of the ROS are noted above.      Objective:   Physical Exam   Physical Exam  Last menstrual period 03/01/2010.  GEN: Well-developed,well-nourished,in no acute distress; alert,appropriate and cooperative throughout examination HEENT: Normocephalic and atraumatic without obvious abnormalities. Ears, externally no deformities PULM: Breathing comfortably in no respiratory distress EXT: No clubbing, cyanosis, or edema PSYCH: Normally interactive. Cooperative during the interview. Pleasant. Friendly and conversant. Not anxious or depressed appearing. Normal, full affect.  Shoulder: R Inspection: No muscle wasting or winging, small upper lateral biceps defect Ecchymosis/edema: neg  AC joint, scapula, clavicle: TTP R Cervical spine: NT, full ROM Spurling's: neg Abduction: full, 4+/5 Flexion: full, 5/5 IR, full, lift-off: 4+/5 ER at neutral: full, 4+/5 Supination and forearm flexion 5/5 AC crossover: pos Neer: pos Hawkins: pos Drop Test: neg Empty Can: pos Supraspinatus insertion: mild-mod T Bicipital groove: NT Speed's: pos Yergason's: neg Sulcus sign: neg Scapular dyskinesis: none C5-T1 intact  Neuro: Sensation intact Grip 5/5       Assessment & Plan:   1. Rotator cuff tendonitis  traMADol (ULTRAM) 50 MG tablet, Ambulatory referral to Physical Therapy    At this point, her biceps tendon and biceps are doing well. She has developed some secondary rotator cuff tendinopathy, impingement and probable bursitis.  At this point, start with formal physical therapy and recheck in 5-6 weeks.

## 2011-06-27 ENCOUNTER — Other Ambulatory Visit: Payer: Self-pay | Admitting: *Deleted

## 2011-06-27 DIAGNOSIS — R030 Elevated blood-pressure reading, without diagnosis of hypertension: Secondary | ICD-10-CM

## 2011-06-27 DIAGNOSIS — I10 Essential (primary) hypertension: Secondary | ICD-10-CM

## 2011-06-29 ENCOUNTER — Ambulatory Visit: Payer: Self-pay | Attending: Family Medicine | Admitting: Rehabilitation

## 2011-06-29 DIAGNOSIS — IMO0001 Reserved for inherently not codable concepts without codable children: Secondary | ICD-10-CM | POA: Insufficient documentation

## 2011-06-29 DIAGNOSIS — R293 Abnormal posture: Secondary | ICD-10-CM | POA: Insufficient documentation

## 2011-06-29 DIAGNOSIS — M25519 Pain in unspecified shoulder: Secondary | ICD-10-CM | POA: Insufficient documentation

## 2011-06-29 DIAGNOSIS — M25619 Stiffness of unspecified shoulder, not elsewhere classified: Secondary | ICD-10-CM | POA: Insufficient documentation

## 2011-06-29 MED ORDER — HYDROCHLOROTHIAZIDE 25 MG PO TABS: 25.0000 mg | ORAL_TABLET | Freq: Every day | ORAL | Status: DC

## 2011-06-30 ENCOUNTER — Ambulatory Visit: Payer: Self-pay | Admitting: Family Medicine

## 2011-07-06 ENCOUNTER — Ambulatory Visit: Payer: Self-pay | Attending: Family Medicine | Admitting: Physical Therapy

## 2011-07-06 DIAGNOSIS — R293 Abnormal posture: Secondary | ICD-10-CM | POA: Insufficient documentation

## 2011-07-06 DIAGNOSIS — M25619 Stiffness of unspecified shoulder, not elsewhere classified: Secondary | ICD-10-CM | POA: Insufficient documentation

## 2011-07-06 DIAGNOSIS — IMO0001 Reserved for inherently not codable concepts without codable children: Secondary | ICD-10-CM | POA: Insufficient documentation

## 2011-07-06 DIAGNOSIS — M25519 Pain in unspecified shoulder: Secondary | ICD-10-CM | POA: Insufficient documentation

## 2011-07-12 ENCOUNTER — Ambulatory Visit: Payer: Self-pay | Admitting: Physical Therapy

## 2011-07-13 ENCOUNTER — Ambulatory Visit: Payer: Self-pay | Admitting: Rehabilitation

## 2011-07-18 ENCOUNTER — Ambulatory Visit: Payer: Self-pay | Admitting: Physical Therapy

## 2011-07-19 ENCOUNTER — Ambulatory Visit: Payer: Self-pay | Admitting: Physical Therapy

## 2011-07-21 ENCOUNTER — Ambulatory Visit (INDEPENDENT_AMBULATORY_CARE_PROVIDER_SITE_OTHER): Payer: Self-pay | Admitting: Family Medicine

## 2011-07-21 DIAGNOSIS — M67919 Unspecified disorder of synovium and tendon, unspecified shoulder: Secondary | ICD-10-CM

## 2011-07-21 DIAGNOSIS — M6530 Trigger finger, unspecified finger: Secondary | ICD-10-CM

## 2011-07-21 DIAGNOSIS — M7580 Other shoulder lesions, unspecified shoulder: Secondary | ICD-10-CM

## 2011-07-22 ENCOUNTER — Encounter: Payer: Self-pay | Admitting: Family Medicine

## 2011-07-22 NOTE — Progress Notes (Signed)
Patient Name: Christina Burton Date of Birth: [birth year] Age: 55 y.o. Medical Record Number: [id number] Gender: female Date of Encounter: 07/21/2011  History of Present Illness:  Christina Burton is a 55 y.o. very pleasant female patient who presents with the following:  Pleasant patient Remember well, here in followup with right-sided impingement syndrome, felt to be secondary. She is having some significant scapular dyskinesis, and she had prior biceps rupture. Her biceps functionality and strength has returned to baseline and she has no pain. Her range of motion and pain with motion abduction and internal rotation during the use of her RIGHT shoulder has gotten much better intervally.  05/26/2011 OV Pleasant patient, follow up RIGHT proximal biceps rupture he was actually doing well with regards to her biceps functionality is straight, but she has developed some secondary shoulder pain and pain with abduction, reaching across the body and pain with internal range of motion. This progressed from the last time I saw her until the day. She's been trying to do some range of motion and home exercise program, but with difficulty.  04/14/2011 OV  Here in f/u of R proximal biceps rupture. Has continued to progress Hit or miss on doing rehab, but doing some of the time.   Feels OK, str improving. Flared up a couple of weeks ago when trimming hedges  03/01/2011 OV  Pleasant patient who had a right proximal biceps rupture approaching 6 weeks ago, she has some swelling, bruising, was briefly immobilized in a sling, and has been favoring this arm since then. She has had significant resolution of swelling and bruising. Only minimal bruising at this point around the elbow.  Been doing some very slight degree of range of motion exercises, but no other rehabilitation yet. Pain is much better. Christina Burton, a 55 y.o. female presents today in the office for the following:    Pleasant patient who had  a right proximal biceps rupture approaching 6 weeks ago, she has some swelling, bruising, was briefly immobilized in a sling, and has been favoring this arm since then. She has had significant resolution of swelling and bruising. Only minimal bruising at this point around the elbow.  Been doing some very slight degree of range of motion exercises, but no other rehabilitation yet. Pain is much better. Here in f/u of R proximal biceps rupture. Has continued to progress Hit or miss on doing rehab, but doing some of the time.   Feels OK, str improving. Flared up a couple of weeks ago when trimming hedges  03/01/2011 OV  Pleasant patient who had a right proximal biceps rupture approaching 6 weeks ago, she has some swelling, bruising, was briefly immobilized in a sling, and has been favoring this arm since then. She has had significant resolution of swelling and bruising. Only minimal bruising at this point around the elbow.  Been doing some very slight degree of range of motion exercises, but no other rehabilitation yet. Pain is much better. Christina Burton, a 55 y.o. female presents today in the office for the following:    Pleasant patient who had a right proximal biceps rupture approaching 6 weeks ago, she has some swelling, bruising, was briefly immobilized in a sling, and has been favoring this arm since then. She has had significant resolution of swelling and bruising. Only minimal bruising at this point around the elbow.  Been doing some very slight degree of range of motion exercises, but no other rehabilitation yet. Pain is much better.  Past Medical History, Surgical History, Social History, Family History, Problem List, Medications, and Allergies have been reviewed and updated if relevant.  Review of Systems:  GEN: No fevers, chills. Nontoxic. Primarily MSK c/o today. MSK: Detailed in the HPI GI: tolerating PO intake without difficulty Neuro: No numbness, parasthesias, or tingling  associated. Otherwise the pertinent positives of the ROS are noted above.    Physical Examination: Filed Vitals:   07/21/11 1506  BP: 120/70    There is no height or weight on file to calculate BMI.   GEN: WDWN, NAD, Non-toxic, Alert & Oriented x 3 HEENT: Atraumatic, Normocephalic.  Ears and Nose: No external deformity. EXTR: No clubbing/cyanosis/edema NEURO: Normal gait.  PSYCH: Normally interactive. Conversant. Not depressed or anxious appearing.  Calm demeanor.   RIGHT shoulder: Approaching full range of motion at this point. Mild painful arc of motion. Right-sided crossover test is positive. Minimally tender at the a.c. Joint. Speech and Yergason's tests are negative. Neer test is negative. Hawkins test is mildly positive. Strength testing is 5/5 at this point.  Notable for digit trigger finger with large nodule that it's catching easily and causing the patient some pain.  Assessment and Plan: Right-sided impingement syndrome subacromial bursitis. Improving. Reassured the patient and encouraged her. Continue with rotator cuff strengthening and scapular stabilization.  The patient also has a right-sided 4th digit trigger finger. This is painful and limiting her use some on the RIGHT hand.  Trigger Finger Injection, R 4th Verbal consent and written consent was obtained. Risks, benefits and alternatives were discussed. Prepped with Chloraprep and Ethyl Chloride used for anesthesia. Under sterile conditions, patient injected at palmar crease aiming distally with 45 degree angle towards nodule; injected directly into tendon sheath. Medication flowed freely without resistance. After the injection, the patient had no pain with nodular catching and motion of the finger. Needle size: 22 gauge 1 1/2 inch Injection: 1/2 cc of Lidocaine 1% and 1/2 cc of Depo-Medrol 40 mg

## 2011-07-27 ENCOUNTER — Ambulatory Visit: Payer: Self-pay | Admitting: Physical Therapy

## 2011-08-03 ENCOUNTER — Ambulatory Visit: Payer: Self-pay | Attending: Family Medicine | Admitting: Rehabilitation

## 2011-08-03 DIAGNOSIS — R293 Abnormal posture: Secondary | ICD-10-CM | POA: Insufficient documentation

## 2011-08-03 DIAGNOSIS — M25619 Stiffness of unspecified shoulder, not elsewhere classified: Secondary | ICD-10-CM | POA: Insufficient documentation

## 2011-08-03 DIAGNOSIS — M25519 Pain in unspecified shoulder: Secondary | ICD-10-CM | POA: Insufficient documentation

## 2011-08-03 DIAGNOSIS — IMO0001 Reserved for inherently not codable concepts without codable children: Secondary | ICD-10-CM | POA: Insufficient documentation

## 2011-08-04 ENCOUNTER — Ambulatory Visit: Payer: Self-pay | Admitting: Rehabilitation

## 2011-08-09 ENCOUNTER — Ambulatory Visit: Payer: Self-pay | Admitting: Physical Therapy

## 2011-08-11 ENCOUNTER — Ambulatory Visit: Payer: Self-pay | Admitting: Physical Therapy

## 2011-08-16 ENCOUNTER — Ambulatory Visit: Payer: Self-pay | Admitting: Rehabilitation

## 2011-08-18 ENCOUNTER — Ambulatory Visit: Payer: Self-pay | Admitting: Rehabilitation

## 2011-08-22 ENCOUNTER — Ambulatory Visit: Payer: Self-pay | Admitting: Physical Therapy

## 2011-08-24 ENCOUNTER — Ambulatory Visit: Payer: Self-pay | Admitting: Physical Therapy

## 2011-09-27 ENCOUNTER — Ambulatory Visit (INDEPENDENT_AMBULATORY_CARE_PROVIDER_SITE_OTHER): Payer: Self-pay | Admitting: Family Medicine

## 2011-09-27 ENCOUNTER — Encounter: Payer: Self-pay | Admitting: Family Medicine

## 2011-09-27 DIAGNOSIS — M6530 Trigger finger, unspecified finger: Secondary | ICD-10-CM

## 2011-09-27 DIAGNOSIS — M719 Bursopathy, unspecified: Secondary | ICD-10-CM

## 2011-09-27 DIAGNOSIS — M75100 Unspecified rotator cuff tear or rupture of unspecified shoulder, not specified as traumatic: Secondary | ICD-10-CM

## 2011-09-27 DIAGNOSIS — M7580 Other shoulder lesions, unspecified shoulder: Secondary | ICD-10-CM

## 2011-09-27 DIAGNOSIS — S43429A Sprain of unspecified rotator cuff capsule, initial encounter: Secondary | ICD-10-CM

## 2011-09-27 DIAGNOSIS — M67919 Unspecified disorder of synovium and tendon, unspecified shoulder: Secondary | ICD-10-CM

## 2011-09-27 MED ORDER — NITROGLYCERIN 0.2 MG/HR TD PT24: MEDICATED_PATCH | TRANSDERMAL | Status: DC

## 2011-09-27 NOTE — Assessment & Plan Note (Signed)
Ultrasound guided injection as above. Come back to see [place] on an as-needed basis for this.

## 2011-09-27 NOTE — Progress Notes (Signed)
Subjective:    Patient ID: Christina Burton, female    DOB: 07/22/1956, 56 y.o.   MRN: [id number]  HPI Right shoulder: Pearlene has a long history of right shoulder pain, she has a history of a proximal biceps tendon rupture, she also has had a cortisone injection in the distant past, she is also gone through several months of formal physical therapy. Today she notes her pain is still there, she localizes over the deltoid and notes that it's worse with abduction and forward flexion type activities.   The patient previously had an MRI when she was seen by Dr. Amedeo Plenty in [facility] orthopedics prior to her initial evaluation in our office.  Right fourth finger: Known trigger finger, requesting injection today.  She also complains of some LEFT metatarsal pain today. No trauma or injury. No swelling or bruising.  12/20?2013 OV Pleasant patient Remember well, here in followup with right-sided impingement syndrome, felt to be secondary. She is having some significant scapular dyskinesis, and she had prior biceps rupture. Her biceps functionality and strength has returned to baseline and she has no pain. Her range of motion and pain with motion abduction and internal rotation during the use of her RIGHT shoulder has gotten much better intervally.  05/26/2011 OV Pleasant patient, follow up RIGHT proximal biceps rupture he was actually doing well with regards to her biceps functionality is straight, but she has developed some secondary shoulder pain and pain with abduction, reaching across the body and pain with internal range of motion. This progressed from the last time I saw her until the day. She's been trying to do some range of motion and home exercise program, but with difficulty.  04/14/2011 OV  Here in f/u of R proximal biceps rupture. Has continued to progress Hit or miss on doing rehab, but doing some of the time.   Feels OK, str improving. Flared up a couple of weeks ago when trimming  hedges  03/01/2011 OV  Pleasant patient who had a right proximal biceps rupture approaching 6 weeks ago, she has some swelling, bruising, was briefly immobilized in a sling, and has been favoring this arm since then. She has had significant resolution of swelling and bruising. Only minimal bruising at this point around the elbow.  Been doing some very slight degree of range of motion exercises, but no other rehabilitation yet. Pain is much better. Christina Burton, a 56 y.o. female presents today in the office for the following:    Pleasant patient who had a right proximal biceps rupture approaching 6 weeks ago, she has some swelling, bruising, was briefly immobilized in a sling, and has been favoring this arm since then. She has had significant resolution of swelling and bruising. Only minimal bruising at this point around the elbow.  Been doing some very slight degree of range of motion exercises, but no other rehabilitation yet. Pain is much better. Here in f/u of R proximal biceps rupture. Has continued to progress Hit or miss on doing rehab, but doing some of the time.   Feels OK, str improving. Flared up a couple of weeks ago when trimming hedges  03/01/2011 OV  Pleasant patient who had a right proximal biceps rupture approaching 6 weeks ago, she has some swelling, bruising, was briefly immobilized in a sling, and has been favoring this arm since then. She has had significant resolution of swelling and bruising. Only minimal bruising at this point around the elbow.  Been doing some very slight degree of  range of motion exercises, but no other rehabilitation yet. Pain is much better. Christina Burton, a 56 y.o. female presents today in the office for the following:    Pleasant patient who had a right proximal biceps rupture approaching 6 weeks ago, she has some swelling, bruising, was briefly immobilized in a sling, and has been favoring this arm since then. She has had significant  resolution of swelling and bruising. Only minimal bruising at this point around the elbow.  Been doing some very slight degree of range of motion exercises, but no other rehabilitation yet. Pain is much better.  The PMH, PSH, Social History, Family History, Medications, and allergies have been reviewed in [facility], and have been updated if relevant.   Review of Systems    No fevers, chills, night sweats, weight loss, chest pain, or shortness of breath.  Social History: Non-smoker. Objective:   Physical Exam General:  Well developed, well nourished, and in no acute distress. Neuro:  Alert and oriented x3, extra-ocular muscles intact. Skin: Warm and dry, no rashes noted. Respiratory:  Not using accessory muscles, speaking in full sentences. Musculoskeletal: Right Shoulder: Inspection reveals no abnormalities, atrophy or asymmetry. Palpation is normal with no tenderness over AC joint or bicipital groove. ROM is full in all planes. Rotator cuff strength weak to abduction. Positive Hawkins, M.D. can with weakness, and Neer sign. Speeds and Yergason's tests normal. No labral pathology noted with negative Obrien's, negative clunk and good stability. Normal scapular function observed. No painful arc and no drop arm sign. No apprehension sign  MSK ultrasound: Large tear in the supraspinatus with some retraction. This is worse with, partial thickness. She also has some irregularities suspicious for tear in the subscapularis. It also appears as though her biceps tendon is torn, this is known. Images saved.  Real-time Ultrasound Guided Injection of: Right subacromial space, right fourth finger trigger. Consent obtained. Time-out conducted. Noted no overlying erythema, induration, or other signs of local infection. Skin prepped in a sterile fashion. Local anesthesia: Topical ethyl chloride With sterile technique and under real time ultrasound guidance:  Right shoulder: Needle advanced into  subacromial space, 1 cc Depo-Medrol, 2 cc lidocaine injected, bursa seen distending.  Right trigger finger: Needle guide under real time ultrasound guidance into sheath of flexor digitorum profundus tendon on the fourth digit. 1 cc Depo-Medrol, 1 cc lidocaine injected, tendon sheath seen distending. Completed without difficulty Pain immediately resolved suggesting accurate placement of the medication. Advised to call if fevers/chills, erythema, induration, drainage, or persistent bleeding. Images saved.      Assessment & Plan:

## 2011-09-27 NOTE — Assessment & Plan Note (Addendum)
Cuff tear seen. Injection as above.  Examined history taken along with Sports Medicine Fellow, and agree with assessment and plan with the following changes: Owens Loffler, MD  Discussed very as treatment options for her injury. With her history, wonder if she had a further injury to her rotator cuff in the fall when she 1st noticed it she had pain with trimming her hedges. We discussed potential treatment options, and the patient would like to be more conservative. I did discuss that operative intervention does require a very significant immobilization and rehabilitation of close to 6 months in total. Limited financial options, and for now we are going to attempt to use nitroglycerin patches to encourage blood flow and healing in her rotator cuff to at least make her more pain free. Her strength is now released fairly reasonable 4-4+ in all directions and she has not had any significant or dramatic impairment.

## 2011-10-20 ENCOUNTER — Ambulatory Visit (INDEPENDENT_AMBULATORY_CARE_PROVIDER_SITE_OTHER): Payer: Self-pay | Admitting: Internal Medicine

## 2011-10-20 ENCOUNTER — Encounter: Payer: Self-pay | Admitting: Internal Medicine

## 2011-10-20 VITALS — BP 115/80 | HR 89 | Temp 97.1°F | Ht 63.0 in | Wt 128.5 lb

## 2011-10-20 DIAGNOSIS — I10 Essential (primary) hypertension: Secondary | ICD-10-CM

## 2011-10-20 DIAGNOSIS — F429 Obsessive-compulsive disorder, unspecified: Secondary | ICD-10-CM

## 2011-10-20 DIAGNOSIS — K5190 Ulcerative colitis, unspecified, without complications: Secondary | ICD-10-CM

## 2011-10-20 DIAGNOSIS — G4700 Insomnia, unspecified: Secondary | ICD-10-CM

## 2011-10-20 DIAGNOSIS — M66329 Spontaneous rupture of flexor tendons, unspecified upper arm: Secondary | ICD-10-CM

## 2011-10-20 DIAGNOSIS — S46119A Strain of muscle, fascia and tendon of long head of biceps, unspecified arm, initial encounter: Secondary | ICD-10-CM

## 2011-10-20 LAB — CBC
HCT: 41.3 % (ref 36.0–46.0)
MCV: 83.3 fL (ref 78.0–100.0)
RBC: 4.96 MIL/uL (ref 3.87–5.11)
RDW: 13.6 % (ref 11.5–15.5)
WBC: 6.5 10*3/uL (ref 4.0–10.5)

## 2011-10-20 MED ORDER — PAROXETINE HCL 20 MG PO TABS: 40.0000 mg | ORAL_TABLET | Freq: Every day | ORAL | Status: DC

## 2011-10-20 NOTE — Assessment & Plan Note (Signed)
Reports Paxil working for her now.  - continue the current therapy.

## 2011-10-20 NOTE — Assessment & Plan Note (Addendum)
Stable, followed by GI.  - continue current therapy -last colonoscopy in 2012, will obtain the records.

## 2011-10-20 NOTE — Progress Notes (Addendum)
Patient ID: Christina Burton, female   DOB: September 14, 1955, 56 y.o.   MRN: [id number]  Subjective:    Patient ID: Christina Burton, female    DOB: 09/09/1955, 56 y.o.   MRN: [id number]  HPI Comments: Patient is a 56 year old woman who returns for regular followup.  1. She has a long history of right shoulder pain with a proximal biceps tendon rupture. She has been following up with Sports medicine with cortisone injection and PT therapy. She states that her problem is getting better. Sports medicine to follow.   2. She also has had some trouble sleeping she's tried taking melatonin 3 mg by mouth every evening with some success. She states her problem is mostly that she can't stay asleep when she falls asleep. She does have poor sleep hygiene and she typically watches TV rapid to going to bed. She does not drink caffeinated beverages per to going to sleep.  3 . History of obsessive compulsive disorder and states that she has been noticing less problems with counting  She tolerated paxil quite well.  4. Patient has a history of ulcerative colitis and she is followed by gastroenterology for this. She was diagnosed with this in approx 1996. Colonoscopy followed by GI.  5. Mammogram last November and already has repeated mammogram scheduled in May 2013.   Past Medical History  Diagnosis Date  . Sinusitis     chronic  . Mitral valve prolapse   . Ulcerative colitis     sees Dr. Collene Mares - normal colonoscopy in Nov 2011  . Anxiety disorder   . Obsessive compulsive disorder   . Allergic rhinitis    Current Outpatient Prescriptions  Medication Sig Dispense Refill  . albuterol (PROVENTIL,VENTOLIN) 90 MCG/ACT inhaler Inhale 2 puffs into the lungs every 6 (six) hours as needed. For shortness of breath or wheezing       . azelastine (ASTELIN) 137 MCG/SPRAY nasal spray 1 spray by Nasal route daily. Use in each nostril as directed       . beclomethasone (QVAR) 80 MCG/ACT inhaler Inhale 2 puffs into the lungs  daily.        . Bepotastine Besilate (BEPREVE) 1.5 % SOLN Use as directed by your ophthalmologist, Dr. Gershon Crane       . cetirizine (ZYRTEC) 10 MG tablet Take 10 mg by mouth daily.        . hydrochlorothiazide (HYDRODIURIL) 25 MG tablet Take 1 tablet (25 mg total) by mouth daily.  30 tablet  10  . Levalbuterol Tartrate (XOPENEX HFA IN) Inhale 1-2 puffs into the lungs as needed.        . mesalamine (CANASA) 1000 MG suppository Use as directed by your gastroenterologist for ulcerative colitis       . mesalamine (LIALDA) 1.2 G EC tablet Take 1,200 mg by mouth daily.        . mometasone (NASONEX) 50 MCG/ACT nasal spray 1 spray by Nasal route daily.        . nitroGLYCERIN (NITRODUR - DOSED IN MG/24 HR) 0.2 mg/hr Apply 1/4 of a patch to the affected area and change every 24 hours (re: tendinopathy)  30 patch  4  . PARoxetine (PAXIL) 20 MG tablet Take 2 tablets (40 mg total) by mouth daily.  60 tablet  5  . pravastatin (PRAVACHOL) 20 MG tablet Take 1 tablet (20 mg total) by mouth every evening.  30 tablet  11  . RABEprazole (ACIPHEX) 20 MG tablet Take 20 mg by mouth 2 (two)  times daily.        Marland Kitchen DISCONTD: PARoxetine (PAXIL) 20 MG tablet Take 2 tablets (40 mg total) by mouth daily.  60 tablet  5   Family History  Problem Relation Age of Onset  . Hypertension Mother   . Hyperlipidemia Mother   . Diabetes Brother   . Diabetes Maternal Grandmother    History   Social History  . Marital Status: Single    Spouse Name: N/A    Number of Children: N/A  . Years of Education: 14   Occupational History  . SECRETARY, clerical, collections    Social History Main Topics  . Smoking status: Never Smoker   . Smokeless tobacco: Never Used  . Alcohol Use: No  . Drug Use: No  . Sexually Active: No   Other Topics Concern  . None   Social History Narrative   Financial assistance approved for 85% discount at [facility] and has [facility] card per [place]7/19/2011   Review of Systems: No headache, fever, or  sore throat. No shortness of breath or dyspnea on exertion. No chest pain, chest pressure or palpitation No nausea, vomiting, or abdominal pain. No melena, diarrhea or incontinence. No muscle weakness.                   Denies depression. No appetite or weight changes.   Objective:  Physical Exam: Filed Vitals:   10/20/11 1428  BP: 115/80  Pulse: 89  Temp: 97.1 F (36.2 C)  TempSrc: Oral  Height: 5' 3"  (1.6 m)  Weight: 128 lb 8 oz (58.287 kg)  SpO2: 94%   General: NAD.  Neck: supple  Cardiovascular: RRR, No M/G/R Pulmonary/Chest: CTA Neurological: A+Ox3 Skin: Skin is warm and dry. No rash noted.  Psychiatric: She has a normal mood and affect. Judgment and thought content normal.    Assessment & Plan:   ADDENDUM: Patient would like to have all her lab works mailed to her and her GI doctor. Nurse to follow

## 2011-10-20 NOTE — Patient Instructions (Addendum)
1. Will check your blood work 2. Will give your paxil refills. 3. Follow up in 3 months.

## 2011-10-20 NOTE — Assessment & Plan Note (Signed)
Followed with sports medicine  - no acute issues now -continue to follow up with the [facility] clinic.

## 2011-10-20 NOTE — Assessment & Plan Note (Signed)
Well controlled - continue the current therapy

## 2011-10-20 NOTE — Assessment & Plan Note (Addendum)
Insomnia for a long term.  - Melatonin 6 mg po daily PRN

## 2011-10-21 LAB — BASIC METABOLIC PANEL WITH GFR
BUN: 15 mg/dL (ref 6–23)
CO2: 26 mEq/L (ref 19–32)
Chloride: 102 mEq/L (ref 96–112)
Creat: 0.58 mg/dL (ref 0.50–1.10)
GFR, Est Non African American: >89 mL/min
Glucose, Bld: 87 mg/dL (ref 70–99)
Potassium: 3.5 mEq/L (ref 3.5–5.3)

## 2011-10-21 LAB — T4, FREE: Free T4: 1.13 ng/dL (ref 0.80–1.80)

## 2011-10-21 LAB — LIPID PANEL
HDL: 71 mg/dL (ref >39)
LDL Cholesterol: 101 mg/dL — ABNORMAL HIGH (ref 0–99)
Total CHOL/HDL Ratio: 2.7 Ratio
VLDL: 19 mg/dL (ref 0–40)

## 2011-10-21 LAB — HEPATIC FUNCTION PANEL
AST: 21 U/L (ref 0–37)
Albumin: 4.7 g/dL (ref 3.5–5.2)
Bilirubin, Direct: 0.1 mg/dL (ref 0.0–0.3)
Total Bilirubin: 0.6 mg/dL (ref 0.3–1.2)

## 2011-11-08 ENCOUNTER — Ambulatory Visit (INDEPENDENT_AMBULATORY_CARE_PROVIDER_SITE_OTHER): Payer: Self-pay | Admitting: Family Medicine

## 2011-11-08 ENCOUNTER — Encounter: Payer: Self-pay | Admitting: Family Medicine

## 2011-11-08 VITALS — BP 110/70

## 2011-11-08 DIAGNOSIS — M75101 Unspecified rotator cuff tear or rupture of right shoulder, not specified as traumatic: Secondary | ICD-10-CM | POA: Insufficient documentation

## 2011-11-08 DIAGNOSIS — S43429A Sprain of unspecified rotator cuff capsule, initial encounter: Secondary | ICD-10-CM

## 2011-11-08 NOTE — Progress Notes (Signed)
Patient Name: Christina Burton Date of Birth: Aug 22, 1955 Age: 56 y.o. Medical Record Number: [id number] Gender: female Date of Encounter: 11/08/2011  History of Present Illness:  Christina Burton is a 56 y.o. very pleasant female patient who presents with the following:  Followup right shoulder pain, supraspinatus tear, and known prior proximal biceps tendon rupture. Our last office visit, the patient had an ultrasound done subacromial injection, and we also started her on nitroglycerin therapy for her shoulder.  She also had a trigger finger injection, and she started placing a nitroglycerin patch on her trigger finger a few times a week. Her pain is better and she is no longer having any catching.  09/27/2011 Right shoulder: Christina Burton has a long history of right shoulder pain, she has a history of a proximal biceps tendon rupture, she also has had a cortisone injection in the distant past, she is also gone through several months of formal physical therapy. Today she notes her pain is still there, she localizes over the deltoid and notes that it's worse with abduction and forward flexion type activities.   The patient previously had an MRI when she was seen by Dr. Amedeo Plenty in [facility] orthopedics prior to her initial evaluation in our office.  Right fourth finger: Known trigger finger, requesting injection today.  She also complains of some LEFT metatarsal pain today. No trauma or injury. No swelling or bruising.  12/20?2013 OV Pleasant patient Remember well, here in followup with right-sided impingement syndrome, felt to be secondary. She is having some significant scapular dyskinesis, and she had prior biceps rupture. Her biceps functionality and strength has returned to baseline and she has no pain. Her range of motion and pain with motion abduction and internal rotation during the use of her RIGHT shoulder has gotten much better intervally.  05/26/2011 OV Pleasant patient, follow up  RIGHT proximal biceps rupture he was actually doing well with regards to her biceps functionality is straight, but she has developed some secondary shoulder pain and pain with abduction, reaching across the body and pain with internal range of motion. This progressed from the last time I saw her until the day. She's been trying to do some range of motion and home exercise program, but with difficulty.  04/14/2011 OV  Here in f/u of R proximal biceps rupture. Has continued to progress Hit or miss on doing rehab, but doing some of the time.   Feels OK, str improving. Flared up a couple of weeks ago when trimming hedges  03/01/2011 OV  Pleasant patient who had a right proximal biceps rupture approaching 6 weeks ago, she has some swelling, bruising, was briefly immobilized in a sling, and has been favoring this arm since then. She has had significant resolution of swelling and bruising. Only minimal bruising at this point around the elbow.  Been doing some very slight degree of range of motion exercises, but no other rehabilitation yet. Pain is much better. Christina Burton, a 56 y.o. female presents today in the office for the following:    Pleasant patient who had a right proximal biceps rupture approaching 6 weeks ago, she has some swelling, bruising, was briefly immobilized in a sling, and has been favoring this arm since then. She has had significant resolution of swelling and bruising. Only minimal bruising at this point around the elbow.  Been doing some very slight degree of range of motion exercises, but no other rehabilitation yet. Pain is much better. Here in f/u of R proximal  biceps rupture. Has continued to progress Hit or miss on doing rehab, but doing some of the time.   Feels OK, str improving. Flared up a couple of weeks ago when trimming hedges  03/01/2011 OV  Pleasant patient who had a right proximal biceps rupture approaching 6 weeks ago, she has some swelling, bruising, was  briefly immobilized in a sling, and has been favoring this arm since then. She has had significant resolution of swelling and bruising. Only minimal bruising at this point around the elbow.  Been doing some very slight degree of range of motion exercises, but no other rehabilitation yet. Pain is much better. Christina Burton, a 56 y.o. female presents today in the office for the following:    Pleasant patient who had a right proximal biceps rupture approaching 6 weeks ago, she has some swelling, bruising, was briefly immobilized in a sling, and has been favoring this arm since then. She has had significant resolution of swelling and bruising. Only minimal bruising at this point around the elbow.  Been doing some very slight degree of range of motion exercises, but no other rehabilitation yet. Pain is much better. Pleasant patient Remember well, here in followup with right-sided impingement syndrome, felt to be secondary. She is having some significant scapular dyskinesis, and she had prior biceps rupture. Her biceps functionality and strength has returned to baseline and she has no pain. Her range of motion and pain with motion abduction and internal rotation during the use of her RIGHT shoulder has gotten much better intervally.  05/26/2011 OV Pleasant patient, follow up RIGHT proximal biceps rupture he was actually doing well with regards to her biceps functionality is straight, but she has developed some secondary shoulder pain and pain with abduction, reaching across the body and pain with internal range of motion. This progressed from the last time I saw her until the day. She's been trying to do some range of motion and home exercise program, but with difficulty.  04/14/2011 OV  Here in f/u of R proximal biceps rupture. Has continued to progress Hit or miss on doing rehab, but doing some of the time.   Feels OK, str improving. Flared up a couple of weeks ago when trimming hedges  03/01/2011  OV  Pleasant patient who had a right proximal biceps rupture approaching 6 weeks ago, she has some swelling, bruising, was briefly immobilized in a sling, and has been favoring this arm since then. She has had significant resolution of swelling and bruising. Only minimal bruising at this point around the elbow.  Been doing some very slight degree of range of motion exercises, but no other rehabilitation yet. Pain is much better. Christina Burton, a 56 y.o. female presents today in the office for the following:    Pleasant patient who had a right proximal biceps rupture approaching 6 weeks ago, she has some swelling, bruising, was briefly immobilized in a sling, and has been favoring this arm since then. She has had significant resolution of swelling and bruising. Only minimal bruising at this point around the elbow.  Been doing some very slight degree of range of motion exercises, but no other rehabilitation yet. Pain is much better. Here in f/u of R proximal biceps rupture. Has continued to progress Hit or miss on doing rehab, but doing some of the time.   Feels OK, str improving. Flared up a couple of weeks ago when trimming hedges  03/01/2011 OV  Pleasant patient who had a right proximal  biceps rupture approaching 6 weeks ago, she has some swelling, bruising, was briefly immobilized in a sling, and has been favoring this arm since then. She has had significant resolution of swelling and bruising. Only minimal bruising at this point around the elbow.  Been doing some very slight degree of range of motion exercises, but no other rehabilitation yet. Pain is much better. Christina Burton, a 56 y.o. female presents today in the office for the following:    Pleasant patient who had a right proximal biceps rupture approaching 6 weeks ago, she has some swelling, bruising, was briefly immobilized in a sling, and has been favoring this arm since then. She has had significant resolution of swelling and  bruising. Only minimal bruising at this point around the elbow.  Been doing some very slight degree of range of motion exercises, but no other rehabilitation yet. Pain is much better.   Past Medical History, Surgical History, Social History, Family History, Problem List, Medications, and Allergies have been reviewed and updated if relevant.  Review of Systems:  GEN: No fevers, chills. Nontoxic. Primarily MSK c/o today. MSK: Detailed in the HPI GI: tolerating PO intake without difficulty Neuro: No numbness, parasthesias, or tingling associated. Otherwise the pertinent positives of the ROS are noted above.    Physical Examination: Filed Vitals:   11/08/11 1426  BP: 110/70    There is no height or weight on file to calculate BMI.   GEN: WDWN, NAD, Non-toxic, Alert & Oriented x 3 HEENT: Atraumatic, Normocephalic.  Ears and Nose: No external deformity. EXTR: No clubbing/cyanosis/edema NEURO: Normal gait.  PSYCH: Normally interactive. Conversant. Not depressed or anxious appearing.  Calm demeanor.   Right shoulder nontender at the a.c. joint. Range of motion is full. Flexion and extension at the elbow of, strength is preserved at 5/5. Strength testing in abduction, internal and external rotation 4++/5. Much improved  Assessment and Plan:  1. Rotator cuff tear, right     She is doing much better. She is moderately compliant with her rehabilitation program. We reviewed this again in the office.  Her strength and pain level is better than it has ever been any time I have evaluate the patient. Increase to half a patch daily on her shoulder, and do a nitroglycerin patch on her hand during the day. Her symptoms have improved on her hand, I think it is reasonable to continue

## 2011-11-10 ENCOUNTER — Encounter: Payer: Self-pay | Admitting: Internal Medicine

## 2011-11-10 ENCOUNTER — Other Ambulatory Visit: Payer: Self-pay | Admitting: Internal Medicine

## 2011-11-10 DIAGNOSIS — Z1239 Encounter for other screening for malignant neoplasm of breast: Secondary | ICD-10-CM

## 2011-11-14 ENCOUNTER — Telehealth: Payer: Self-pay | Admitting: *Deleted

## 2011-11-14 NOTE — Telephone Encounter (Signed)
Pt wanted copies of labs 10/20/11 - Dr Nicoletta Dress aware ok. Mailed to pt. Hilda Blades Maris Abascal RN 11/14/11 11AM

## 2011-12-20 ENCOUNTER — Encounter: Payer: Self-pay | Admitting: Family Medicine

## 2011-12-20 ENCOUNTER — Ambulatory Visit (INDEPENDENT_AMBULATORY_CARE_PROVIDER_SITE_OTHER): Payer: Self-pay | Admitting: Family Medicine

## 2011-12-20 VITALS — BP 112/80 | HR 84

## 2011-12-20 DIAGNOSIS — M25519 Pain in unspecified shoulder: Secondary | ICD-10-CM

## 2011-12-20 DIAGNOSIS — M75101 Unspecified rotator cuff tear or rupture of right shoulder, not specified as traumatic: Secondary | ICD-10-CM

## 2011-12-20 DIAGNOSIS — M25511 Pain in right shoulder: Secondary | ICD-10-CM

## 2011-12-20 DIAGNOSIS — S43429A Sprain of unspecified rotator cuff capsule, initial encounter: Secondary | ICD-10-CM

## 2011-12-20 NOTE — Progress Notes (Signed)
  Patient Name: Christina Burton Date of Birth: April 15, 1956 Age: 56 y.o. Medical Record Number: [id number] Gender: female Date of Encounter: 12/20/2011  History of Present Illness:  Christina Burton is a 56 y.o. very pleasant female patient who presents with the following:  F/u old rtc tear and biceps tear, str much improved. Still with some aching, pain with opening jars and more intense activity, but she is much improved compared to previous exams in months past.   Still having difficulty with rehab  Past Medical History, Surgical History, Social History, Family History, Problem List, Medications, and Allergies have been reviewed and updated if relevant.  Review of Systems:  GEN: No fevers, chills. Nontoxic. Primarily MSK c/o today. MSK: Detailed in the HPI GI: tolerating PO intake without difficulty Neuro: No numbness, parasthesias, or tingling associated. Otherwise the pertinent positives of the ROS are noted above.   Trigger finger persistant problem  Physical Examination: Filed Vitals:   12/20/11 1424  BP: 112/80  Pulse: 84    There is no height or weight on file to calculate BMI.   GEN: WDWN, NAD, Non-toxic, Alert & Oriented x 3 HEENT: Atraumatic, Normocephalic.  Ears and Nose: No external deformity. EXTR: No clubbing/cyanosis/edema NEURO: Normal gait.  PSYCH: Normally interactive. Conversant. Not depressed or anxious appearing.  Calm demeanor.   Shoulder: R Inspection: No muscle wasting or winging Ecchymosis/edema: neg  AC joint, scapula, clavicle: NT Cervical spine: NT, full ROM Spurling's: neg Abduction: full, 5/5 Flexion: full, 5/5 IR, full, lift-off: 5/5 ER at neutral: full, 5/5 AC crossover and compression: neg Neer: neg Hawkins: neg Drop Test: neg Empty Can: neg Supraspinatus insertion: NT Bicipital groove: NT Speed's: neg Yergason's: neg Sulcus sign: neg Scapular dyskinesis: elevation and forward C5-T1 intact Sensation intact Grip 5/5    Assessment and Plan: RTC tear Improving, but with persistent problems with shoulder mechanics. Dramatically improved from months ago. Reviewed rehab again, c/w ntg. Will try hot patches

## 2012-01-26 ENCOUNTER — Encounter: Payer: Self-pay | Admitting: Internal Medicine

## 2012-01-26 ENCOUNTER — Ambulatory Visit (INDEPENDENT_AMBULATORY_CARE_PROVIDER_SITE_OTHER): Payer: Self-pay | Admitting: Internal Medicine

## 2012-01-26 VITALS — BP 106/65 | HR 99 | Temp 97.0°F | Ht 63.0 in | Wt 131.4 lb

## 2012-01-26 DIAGNOSIS — I10 Essential (primary) hypertension: Secondary | ICD-10-CM

## 2012-01-26 DIAGNOSIS — S46119A Strain of muscle, fascia and tendon of long head of biceps, unspecified arm, initial encounter: Secondary | ICD-10-CM

## 2012-01-26 DIAGNOSIS — M66329 Spontaneous rupture of flexor tendons, unspecified upper arm: Secondary | ICD-10-CM

## 2012-01-26 DIAGNOSIS — F429 Obsessive-compulsive disorder, unspecified: Secondary | ICD-10-CM

## 2012-01-26 DIAGNOSIS — K5190 Ulcerative colitis, unspecified, without complications: Secondary | ICD-10-CM

## 2012-01-26 NOTE — Patient Instructions (Addendum)
1. Follow up with the clinic in 6 months 2. Please start to exercise daily.

## 2012-01-27 LAB — BASIC METABOLIC PANEL WITH GFR
BUN: 23 mg/dL (ref 6–23)
CO2: 30 mEq/L (ref 19–32)
Chloride: 102 mEq/L (ref 96–112)
GFR, Est African American: >89 mL/min
Glucose, Bld: 109 mg/dL — ABNORMAL HIGH (ref 70–99)
Potassium: 3.1 mEq/L — ABNORMAL LOW (ref 3.5–5.3)
Sodium: 142 mEq/L (ref 135–145)

## 2012-01-27 LAB — URINALYSIS, ROUTINE W REFLEX MICROSCOPIC
Bilirubin Urine: NEGATIVE
Glucose, UA: NEGATIVE mg/dL
Ketones, ur: NEGATIVE mg/dL
Specific Gravity, Urine: 1.033 — ABNORMAL HIGH (ref 1.005–1.030)
Urobilinogen, UA: 0.2 mg/dL (ref 0.0–1.0)
pH: 5.5 (ref 5.0–8.0)

## 2012-01-29 NOTE — Assessment & Plan Note (Signed)
Stable. Tolerate Paxil well.  - Patient is instructed to call the clinic for psychiatrist referral when needed.

## 2012-01-29 NOTE — Assessment & Plan Note (Signed)
Sports medicine to follow. Patient continues to feel better.

## 2012-01-29 NOTE — Progress Notes (Signed)
Subjective:    Patient ID: Christina Burton, female    DOB: Apr 13, 1956, 56 y.o.   MRN: [id number]  HPI Comments: Patient is a 56 year old woman who returns for regular followup.  1. History of a proximal biceps tendon rupture. She has been following up with Sports medicine with cortisone injection and PT therapy. She has finished her cortisone injections and currently working with PT. She reports that her condition is getting better.   2. Insomnia She reports that Melatonin 3 mg 2 tabs work for her.  3 . History of obsessive compulsive disorder. She tolerated paxil quite well.  4. Patient has a history of ulcerative colitis and she is followed by gastroenterology for this. She was diagnosed with this in approx 1996. Last Colonoscopy in November 2012 followed by GI.   Past Medical History  Diagnosis Date  . Sinusitis     chronic  . Mitral valve prolapse   . Ulcerative colitis     sees Dr. Collene Mares - normal colonoscopy in Nov 2011  . Anxiety disorder   . Obsessive compulsive disorder   . Allergic rhinitis    Current Outpatient Prescriptions  Medication Sig Dispense Refill  . albuterol (PROVENTIL,VENTOLIN) 90 MCG/ACT inhaler Inhale 2 puffs into the lungs every 6 (six) hours as needed. For shortness of breath or wheezing       . azelastine (ASTELIN) 137 MCG/SPRAY nasal spray 1 spray by Nasal route daily. Use in each nostril as directed       . beclomethasone (QVAR) 80 MCG/ACT inhaler Inhale 2 puffs into the lungs daily.        . Bepotastine Besilate (BEPREVE) 1.5 % SOLN Use as directed by your ophthalmologist, Dr. Gershon Crane       . cetirizine (ZYRTEC) 10 MG tablet Take 10 mg by mouth daily.        . hydrochlorothiazide (HYDRODIURIL) 25 MG tablet Take 1 tablet (25 mg total) by mouth daily.  30 tablet  10  . Levalbuterol Tartrate (XOPENEX HFA IN) Inhale 1-2 puffs into the lungs as needed.        . mesalamine (CANASA) 1000 MG suppository Use as directed by your gastroenterologist for  ulcerative colitis       . mesalamine (LIALDA) 1.2 G EC tablet Take 1,200 mg by mouth daily.        . mometasone (NASONEX) 50 MCG/ACT nasal spray 1 spray by Nasal route daily.        . nitroGLYCERIN (NITRODUR - DOSED IN MG/24 HR) 0.2 mg/hr Apply 1/4 of a patch to the affected area and change every 24 hours (re: tendinopathy)  30 patch  4  . PARoxetine (PAXIL) 20 MG tablet Take 2 tablets (40 mg total) by mouth daily.  60 tablet  5  . pravastatin (PRAVACHOL) 20 MG tablet Take 1 tablet (20 mg total) by mouth every evening.  30 tablet  11  . RABEprazole (ACIPHEX) 20 MG tablet Take 20 mg by mouth 2 (two) times daily.         Family History  Problem Relation Age of Onset  . Hypertension Mother   . Hyperlipidemia Mother   . Diabetes Brother   . Diabetes Maternal Grandmother    History   Social History  . Marital Status: Single    Spouse Name: N/A    Number of Children: N/A  . Years of Education: 14   Occupational History  . SECRETARY, clerical, collections    Social History Main Topics  . Smoking status:  Never Smoker   . Smokeless tobacco: Never Used  . Alcohol Use: No  . Drug Use: No  . Sexually Active: No   Other Topics Concern  . None   Social History Narrative   Financial assistance approved for 85% discount at [facility] and has [facility] card per [place]7/19/2011   Review of Systems: No headache, fever, or sore throat. No shortness of breath or dyspnea on exertion. No chest pain, chest pressure or palpitation No nausea, vomiting, or abdominal pain. No melena, diarrhea or incontinence. No muscle weakness.                   Denies depression. No appetite or weight changes.   Objective:  Physical Exam: Filed Vitals:   01/26/12 1502  BP: 106/65  Pulse: 99  Temp: 97 F (36.1 C)  TempSrc: Oral  Height: 5' 3"  (1.6 m)  Weight: 131 lb 6.4 oz (59.603 kg)  SpO2: 96%   General: NAD.  Neck: supple  Cardiovascular: RRR, No M/G/R Pulmonary/Chest: CTA Neurological: A+Ox3 Skin:  Skin is warm and dry. No rash noted.  Psychiatric: She has a normal mood and affect. Judgment and thought content normal.    Assessment & Plan:   ADDENDUM: Patient would like to have all her lab works mailed to her. Nurse to follow

## 2012-01-29 NOTE — Assessment & Plan Note (Signed)
Last colonoscopy in November, 2012 and records is in Media session.  Continue to follow up with her GI

## 2012-01-31 ENCOUNTER — Telehealth: Payer: Self-pay | Admitting: Internal Medicine

## 2012-01-31 MED ORDER — POTASSIUM CHLORIDE ER 10 MEQ PO TBCR: 40.0000 meq | EXTENDED_RELEASE_TABLET | Freq: Every day | ORAL | Status: DC

## 2012-01-31 NOTE — Telephone Encounter (Signed)
Patient is called to pick up the [facility] at [facility].

## 2012-02-06 ENCOUNTER — Other Ambulatory Visit: Payer: Self-pay | Admitting: Internal Medicine

## 2012-02-08 ENCOUNTER — Telehealth: Payer: Self-pay | Admitting: *Deleted

## 2012-02-08 NOTE — Telephone Encounter (Signed)
Lab result from 01/26/12 mailed to pt per Dr Nicoletta Dress.

## 2012-02-20 ENCOUNTER — Encounter: Payer: Self-pay | Admitting: Sports Medicine

## 2012-02-20 ENCOUNTER — Ambulatory Visit (INDEPENDENT_AMBULATORY_CARE_PROVIDER_SITE_OTHER): Payer: Self-pay | Admitting: Sports Medicine

## 2012-02-20 VITALS — BP 120/82 | HR 83 | Ht 63.0 in | Wt 134.0 lb

## 2012-02-20 DIAGNOSIS — S46119A Strain of muscle, fascia and tendon of long head of biceps, unspecified arm, initial encounter: Secondary | ICD-10-CM

## 2012-02-20 DIAGNOSIS — S46819A Strain of other muscles, fascia and tendons at shoulder and upper arm level, unspecified arm, initial encounter: Secondary | ICD-10-CM

## 2012-02-20 DIAGNOSIS — M25519 Pain in unspecified shoulder: Secondary | ICD-10-CM

## 2012-02-20 DIAGNOSIS — S43499A Other sprain of unspecified shoulder joint, initial encounter: Secondary | ICD-10-CM

## 2012-02-20 NOTE — Patient Instructions (Addendum)
Use one half of the .2 nitroglycerin patches each night on your right shoulder Continue with her home exercise program Wrap 2 Band-Aids around the first knuckle of your trigger finger at night Followup with me in 4 weeks

## 2012-02-21 NOTE — Progress Notes (Signed)
  Subjective:    Patient ID: Christina Burton, female    DOB: 1955-09-02, 56 y.o.   MRN: [id number]  HPI patient comes in today for followup on her right shoulder. She has been followed by Dr.Copeland. Per review of the records, including an ultrasound and MRI, patient has been treated for a partial rotator cuff tear as well as a proximal biceps tendon rupture. She's been treated with subacromial cortisone injections as well as home exercise program. Prior to seeing Dr. Edilia Bo, she saw Dr. Amedeo Plenty. She's also been given a prescription for topical nitroglycerin patches, but has been using these intermittently. Overall she feels like her pain is improved. Still some limitations in function especially with reaching overhead or behind her back. She gets an occasional catch in the anterior aspect of her shoulder which at times radiating down to the biceps. No associated numbness or tingling.    Review of Systems     Objective:   Physical Exam Well-developed, well-nourished. No acute distress Right shoulder: Patient demonstrates full range of motion with pain at the extremes of overhead reaching and internal rotation. No tenderness along the clavicle or a.c. joint. Slight tenderness over the bicipital groove. Positive speed's, negative Yeargenson's. Obvious Popeye deformity. Good strength with flexion. Positive empty can, positive Hawkins. Rotator cuff strength is 5 out of 5 but reproducible pain with resisted supraspinatus. She is neurovascular intact distally       Assessment & Plan:  1. Right shoulder pain secondary to rotator cuff tear and proximal biceps tendon rupture  I think most of her symptomatology is related to her proximal biceps tendon rupture. She likely has a small strand still attached in the bicipital groove. Overall, it sounds like she has made good progress to date. I've asked that she continue with her home exercises and use her nitroglycerin patches on a daily basis. She will  apply a quarter patch over the bicipital groove daily. She is cautioned about the possibility of headache. Followup with me in 4 weeks for recheck.

## 2012-03-04 ENCOUNTER — Emergency Department (HOSPITAL_COMMUNITY): Payer: Self-pay

## 2012-03-04 ENCOUNTER — Encounter (HOSPITAL_COMMUNITY): Payer: Self-pay | Admitting: *Deleted

## 2012-03-04 ENCOUNTER — Emergency Department (HOSPITAL_COMMUNITY)
Admission: EM | Admit: 2012-03-04 | Discharge: 2012-03-04 | Disposition: A | Discharge status: Home / Self Care | Payer: Self-pay | Attending: Emergency Medicine | Admitting: Emergency Medicine

## 2012-03-04 DIAGNOSIS — S1093XA Contusion of unspecified part of neck, initial encounter: Secondary | ICD-10-CM | POA: Insufficient documentation

## 2012-03-04 DIAGNOSIS — Y92009 Unspecified place in unspecified non-institutional (private) residence as the place of occurrence of the external cause: Secondary | ICD-10-CM | POA: Insufficient documentation

## 2012-03-04 DIAGNOSIS — W06XXXA Fall from bed, initial encounter: Secondary | ICD-10-CM | POA: Insufficient documentation

## 2012-03-04 DIAGNOSIS — J329 Chronic sinusitis, unspecified: Secondary | ICD-10-CM | POA: Insufficient documentation

## 2012-03-04 DIAGNOSIS — S0003XA Contusion of scalp, initial encounter: Secondary | ICD-10-CM | POA: Insufficient documentation

## 2012-03-04 DIAGNOSIS — Z79899 Other long term (current) drug therapy: Secondary | ICD-10-CM | POA: Insufficient documentation

## 2012-03-04 DIAGNOSIS — S0083XA Contusion of other part of head, initial encounter: Secondary | ICD-10-CM

## 2012-03-04 DIAGNOSIS — K5190 Ulcerative colitis, unspecified, without complications: Secondary | ICD-10-CM | POA: Insufficient documentation

## 2012-03-04 DIAGNOSIS — I059 Rheumatic mitral valve disease, unspecified: Secondary | ICD-10-CM | POA: Insufficient documentation

## 2012-03-04 DIAGNOSIS — S5010XA Contusion of unspecified forearm, initial encounter: Secondary | ICD-10-CM

## 2012-03-04 DIAGNOSIS — F411 Generalized anxiety disorder: Secondary | ICD-10-CM | POA: Insufficient documentation

## 2012-03-04 DIAGNOSIS — M542 Cervicalgia: Secondary | ICD-10-CM | POA: Insufficient documentation

## 2012-03-04 DIAGNOSIS — S0181XA Laceration without foreign body of other part of head, initial encounter: Secondary | ICD-10-CM

## 2012-03-04 DIAGNOSIS — F429 Obsessive-compulsive disorder, unspecified: Secondary | ICD-10-CM | POA: Insufficient documentation

## 2012-03-04 DIAGNOSIS — S0180XA Unspecified open wound of other part of head, initial encounter: Secondary | ICD-10-CM | POA: Insufficient documentation

## 2012-03-04 MED ORDER — ACETAMINOPHEN-CODEINE #3 300-30 MG PO TABS: 1.0000 | ORAL_TABLET | Freq: Four times a day (QID) | ORAL | Status: AC | PRN

## 2012-03-04 MED ORDER — ACETAMINOPHEN-CODEINE #3 300-30 MG PO TABS
1.0000 | ORAL_TABLET | Freq: Once | ORAL | Status: AC
  Administered 2012-03-04: 1 via ORAL
  Filled 2012-03-04: qty 1

## 2012-03-04 NOTE — ED Notes (Signed)
The patient is AOx4 and comfortable with her discharge instructions.  The patient's ride home is present.

## 2012-03-04 NOTE — ED Notes (Signed)
Pt states that she rolled out of bed and hit head on nightstand. Pt states that she had blood in bedroom. Pt has abrasion above left eye and left check and bruise to left check. Pt denies LOC, pt states bright lights seen.

## 2012-03-04 NOTE — ED Provider Notes (Signed)
History     CSN: [id number]  Arrival date & time 03/04/12  0050   First MD Initiated Contact with Patient 03/04/12 [phone]      Chief Complaint  Patient presents with  . Fall    (Consider location/radiation/quality/duration/timing/severity/associated sxs/prior treatment) HPI Comments: Patient reports earlier this evening she had not gotten asleep yet but had laid down in bed and then remembered to get something and rolled over and fell just a little bit off her bed and struck her side of her face and forehead on the dresser nightstand as well as on her right forearm. She reports a little bit of bruising and swelling to the middle of her right forearm but not significant pain and still has full range of motion. She did not black out, no seizure activity no numbness or weakness, no severe headache. She reports slight neck pain more to the right side of her neck. No numbness or weakness of her arms and legs. She did put some cold water to her face to help limit the swelling. She reports she is not on aspirin, Plavix, Coumadin or any other blood thinners.  The history is provided by the patient.    Past Medical History  Diagnosis Date  . Sinusitis     chronic  . Mitral valve prolapse   . Ulcerative colitis     sees Dr. Collene Mares - normal colonoscopy in Nov 2011  . Anxiety disorder   . Obsessive compulsive disorder   . Allergic rhinitis     Past Surgical History  Procedure Date  . Carpal tunnel release   . Hernia repair   . Trigger finger release     right thumb  . Tonsilectomy, adenoidectomy, bilateral myringotomy and tubes     Family History  Problem Relation Age of Onset  . Hypertension Mother   . Hyperlipidemia Mother   . Diabetes Brother   . Diabetes Maternal Grandmother   . Lymphoma Father   . Seizures Sister     History  Substance Use Topics  . Smoking status: Never Smoker   . Smokeless tobacco: Never Used  . Alcohol Use: No    OB History    Grav Para Term Preterm  Abortions TAB SAB Ect Mult Living                  Review of Systems  Constitutional: Negative.   HENT: Positive for neck pain. Negative for neck stiffness.   Eyes: Positive for visual disturbance. Negative for pain, discharge and redness.  Gastrointestinal: Negative for nausea and vomiting.  Musculoskeletal: Negative for back pain.  Skin: Positive for color change and wound.  Neurological: Negative for headaches.    Allergies  Amoxicillin; Articaine; Atenolol; Sulfonamide derivatives; and Trimox  Home Medications   Current Outpatient Rx  Name Route Sig Dispense Refill  . ACETAMINOPHEN 500 MG PO TABS Oral Take 500 mg by mouth every 6 (six) hours as needed. For pain    . ALBUTEROL 90 MCG/ACT IN AERS Inhalation Inhale 2 puffs into the lungs every 6 (six) hours as needed. For shortness of breath or wheezing     . VITAMIN C PO Oral Take 1 tablet by mouth daily.    . AZELASTINE HCL 137 MCG/SPRAY NA SOLN Nasal 1 spray by Nasal route daily. Use in each nostril as directed     . BECLOMETHASONE DIPROPIONATE 80 MCG/ACT IN AERS Inhalation Inhale 2 puffs into the lungs daily.      Marland Kitchen BEPOTASTINE BESILATE 1.5 %  OP SOLN Both Eyes Place 1 drop into both eyes daily as needed. Use as directed by your ophthalmologist, Dr. Gershon Crane for allergies    . CETIRIZINE HCL 10 MG PO TABS Oral Take 10 mg by mouth daily.      Marland Kitchen HYDROCHLOROTHIAZIDE 25 MG PO TABS Oral Take 1 tablet (25 mg total) by mouth daily. 30 tablet 10  . LEVALBUTEROL TARTRATE 45 MCG/ACT IN AERO Inhalation Inhale 1-2 puffs into the lungs every 4 (four) hours as needed. For shortness of breath    . MESALAMINE 1000 MG RE SUPP Rectal Place 1,000 mg rectally daily as needed. Use as directed by your gastroenterologist for ulcerative colitis    . MESALAMINE 1.2 G PO TBEC Oral Take 1,200 mg by mouth daily.      . MOMETASONE FUROATE 50 MCG/ACT NA SUSP Nasal 1 spray by Nasal route daily.      . ADULT MULTIVITAMIN W/MINERALS CH Oral Take 1 tablet by  mouth daily.    Marland Kitchen NITROGLYCERIN 0.2 MG/HR TD PT24  Apply 1/4 of a patch to the affected area and change every 24 hours (re: tendinopathy) 30 patch 4  . OMEGA-3-ACID ETHYL ESTERS 1 G PO CAPS Oral Take 1 g by mouth daily.    Marland Kitchen PAROXETINE HCL 20 MG PO TABS Oral Take 2 tablets (40 mg total) by mouth daily. 60 tablet 5  . PRAVASTATIN SODIUM 20 MG PO TABS  take 1 tablet by mouth every evening 30 tablet 11  . RABEPRAZOLE SODIUM 20 MG PO TBEC Oral Take 20 mg by mouth 2 (two) times daily.      . ACETAMINOPHEN-CODEINE #3 300-30 MG PO TABS Oral Take 1-2 tablets by mouth every 6 (six) hours as needed for pain. 15 tablet 0    BP 157/97  Pulse 66  Temp 98.6 F (37 C) (Oral)  Resp 18  SpO2 98%  LMP 03/01/2010  Physical Exam  Nursing note and vitals reviewed. Constitutional: She is oriented to person, place, and time. She appears well-developed and well-nourished.  HENT:  Head: Normocephalic. Head is with contusion and with laceration.         Patient has a linear 2 cm laceration just above her left eyebrow and barely involving her eyebrow. No significant swelling or contusion. No active bleeding. It does not appear dirty or contaminated. Skin edges are well aligned. No obvious foreign bodies are noted.  Eyes: Conjunctivae, EOM and lids are normal. Pupils are equal, round, and reactive to light.  Neck: Normal range of motion and full passive range of motion without pain. Spinous process tenderness and muscular tenderness present. No rigidity. Normal range of motion present.    Pulmonary/Chest: Effort normal. No respiratory distress.  Abdominal: Soft.  Musculoskeletal:       Arms: Neurological: She is alert and oriented to person, place, and time. No cranial nerve deficit.  Skin: Skin is warm and dry.    ED Course  Wound repair Date/Time: 03/04/2012 4:53 AM Performed by: Donzetta Matters Authorized by: Donzetta Matters Consent: Verbal consent obtained. Risks and benefits: risks, benefits and  alternatives were discussed Consent given by: patient Patient understanding: patient states understanding of the procedure being performed Patient consent: the patient's understanding of the procedure matches consent given Patient identity confirmed: verbally with patient Time out: Immediately prior to procedure a "time out" was called to verify the correct patient, procedure, equipment, support staff and site/side marked as required. Preparation: Patient was prepped and draped in the usual  sterile fashion. Local anesthesia used: no Patient sedated: no Patient tolerance: Patient tolerated the procedure well with no immediate complications. Comments: Dermabond applied to laceration above left eyebrow, 2 cm laceration, simple   (including critical care time)  Labs Reviewed - No data to display Dg Cervical Spine Complete  03/04/2012  *RADIOLOGY REPORT*  Clinical Data: The patient fell, striking face and neck.  Posterior neck pain radiating to the right shoulder.  CERVICAL SPINE - COMPLETE 4+ VIEW  Comparison: None.  Findings: Normal alignment of the cervical vertebrae and and facet joints.  Degenerative changes throughout the cervical spine with narrowed interspaces and associated endplate hypertrophic changes. No vertebral compression deformities.  No prevertebral soft tissue swelling.  Lateral masses of C1 appear symmetrical.  The odontoid process appears intact.  No focal bone lesion or bone destruction. Degenerative changes in the cervical facet joints.  IMPRESSION: Degenerative changes in the cervical spine.  No displaced fractures.  Original Report Authenticated By: Neale Burly, M.D.     1. Forehead laceration   2. Forearm contusion   3. Facial contusion       MDM   Due to slight cervical spine pain and minimal tenderness, plain film was obtained. I have low pretest suspicion for fracture. She has no symptoms of numbness or weakness in her upper extremities. Plain film shows no  fracture per radiologist. I have reviewed them myself. Laceration is repaired with Dermabond as noted above. It was irrigated thoroughly. Patient tolerated the procedure well. I will give her return instructions for head injury and otherwise she can followup with her primary care physician as needed. She has a mild contusion of the forearm which should get better on its own. No need for film of the forearm. Rest, ice, compression and elevation are recommended.        Saddie Benders. Dorna Mai, MD 03/04/12 [id number]

## 2012-03-20 ENCOUNTER — Ambulatory Visit (INDEPENDENT_AMBULATORY_CARE_PROVIDER_SITE_OTHER): Payer: Self-pay | Admitting: Sports Medicine

## 2012-03-20 VITALS — BP 129/86 | Ht 63.0 in | Wt 135.0 lb

## 2012-03-20 DIAGNOSIS — S43499A Other sprain of unspecified shoulder joint, initial encounter: Secondary | ICD-10-CM

## 2012-03-20 DIAGNOSIS — S46119A Strain of muscle, fascia and tendon of long head of biceps, unspecified arm, initial encounter: Secondary | ICD-10-CM

## 2012-03-20 NOTE — Progress Notes (Signed)
  Subjective:    Patient ID: Christina Burton, female    DOB: 09-14-1955, 57 y.o.   MRN: [id number]  HPI Patient comes in today for followup on her right shoulder. She has both a rotator cuff injury in a proximal biceps tendon rupture. Still struggling with intermittent pain which she localizes mainly to the anterior aspect of her shoulder. Occasional popping and catching. She has been using her nitroglycerin patch and doing her home exercises. Since her last visit she has been to the emergency room. She will out of the bed and struck the left side of her face on her nightstand. She had a small laceration above her left eye which was Dermabonded.     Review of Systems     Objective:   Physical Exam Well-developed, well-nourished. She has ecchymosis underneath the left eye and a small healed laceration above the left eye. No palpable deformity. No infection. Right shoulder: Full range of motion but pain with direct overhead reaching and full internal rotation. She is tender to palpation over the bicipital groove. Still has a noticeable Popeye deformity with elbow flexion. Rotator cuff strength remains 5/5 but reproducible of pain with resisted supraspinatus testing. She is neurovascularly intact distally.      Assessment & Plan:  Persistent right shoulder pain secondary to rotator cuff tear and proximal biceps tendon rupture  Patient is unable to afford anything surgical. I recommended that she move her nitroglycerin patch from the insertion of the supraspinatus more medially over the bicipital groove. Continue with her home exercise program. She's had tramadol in the past but she does not feel like her symptoms are severe enough that she would like a refill. We cannot do oral anti-inflammatories do to history of ulcerative colitis. Patient is to return to the office in 4 weeks. I will plan on repeating her ultrasound at that visit.

## 2012-04-24 ENCOUNTER — Ambulatory Visit (INDEPENDENT_AMBULATORY_CARE_PROVIDER_SITE_OTHER): Payer: Self-pay | Admitting: Sports Medicine

## 2012-04-24 VITALS — BP 114/70 | Ht 63.0 in | Wt 130.0 lb

## 2012-04-24 DIAGNOSIS — S46119A Strain of muscle, fascia and tendon of long head of biceps, unspecified arm, initial encounter: Secondary | ICD-10-CM

## 2012-04-24 DIAGNOSIS — M25519 Pain in unspecified shoulder: Secondary | ICD-10-CM

## 2012-04-24 DIAGNOSIS — S46819A Strain of other muscles, fascia and tendons at shoulder and upper arm level, unspecified arm, initial encounter: Secondary | ICD-10-CM

## 2012-04-24 DIAGNOSIS — S43499A Other sprain of unspecified shoulder joint, initial encounter: Secondary | ICD-10-CM

## 2012-04-24 NOTE — Progress Notes (Signed)
  Subjective:    Patient ID: Christina Burton, female    DOB: [birth year], 56 y.o.   MRN: [id number]  HPI Patient comes in today for followup on chronic right shoulder pain. Please see previous notes for detailed history. She still struggling with diffuse pain throughout the right shoulder particularly with activity. She has been using a nitroglycerin patch topically for the past 2 months. Tolerating it well but she's not sure whether or not it's making any difference in her pain. She gets occasional catching and popping. No recent trauma to the shoulder.    Review of Systems     Objective:   Physical Exam Well-developed, well-nourished. No acute distress. Awake alert and oriented x3  Right shoulder shows full range of motion. She is tender to palpation over the bicipital groove and has an obvious Popeye deformity. Pain with both speed and Yergason's testing. Mildly positive empty can, positive Hawkins. Rotator cuff strength is 5 out of 5 but reproducible pain with resisted supraspinatus on the right. No tenderness over the a.c. Joint. Neurovascular intact distally.  [facility]K ultrasound of the right shoulder was performed. Unfortunately images were not saved do to the images being obtained on a [employer] machine which was on loan to [place]. I did appreciate a small area of hypoechogenicity at the insertion of the infraspinatus tendon. Supraspinatus, subscapularis both appear to be intact. The biceps tendon is retracted but there does appear to be a couple of fibers still intact. Visualized posterior labrum was within normal limits. There was some swelling of subacromial bursa and some evidence of supraspinatus impingement with active shoulder abduction.       Assessment & Plan:  1. Persistent right shoulder pain secondary to proximal biceps tendon rupture with ultrasound evidence of rotator cuff tendinopathy... question labral tear  Patient will return to the office later this week for an ultrasound  guided injection into her right glenohumeral joint. She will continue with topical nitroglycerin and continue with her home exercise program.  On an unrelated note, the patient has a mildly symptomatic trigger finger. This finger has been injected twice previously with cortisone and I do not think any further cortisone injections will be of benefit. I did give her some Coban and to use as a PIP joint splint at night.

## 2012-04-24 NOTE — Patient Instructions (Addendum)
It was great to see you today.  We would like for you to come back on Thursday Sept 26 at 1030 to have your shoulder injected using ultrasound.

## 2012-04-26 ENCOUNTER — Ambulatory Visit (INDEPENDENT_AMBULATORY_CARE_PROVIDER_SITE_OTHER): Payer: Self-pay | Admitting: Sports Medicine

## 2012-04-26 VITALS — BP 100/70 | Ht 63.0 in | Wt 135.0 lb

## 2012-04-26 DIAGNOSIS — M25519 Pain in unspecified shoulder: Secondary | ICD-10-CM

## 2012-04-27 NOTE — Progress Notes (Signed)
Patient ID: Christina Burton, female   DOB: 1956-07-04, 56 y.o.   MRN: [id number] Patient comes in today for an ultrasound guided glenohumeral injection into her right shoulder. Please see office note dated 9/25 for history and physical exam.  Patient's right glenohumeral space was localized using ultrasound. Consent was obtained. Afterwards, the skin over the posterior shoulder was prepped in a sterile fashion. The ultrasound probe was sterilized injection was performed using sterilized ultrasound gel. Under ultrasound guidance, the right glenohumeral space was injected with 1 cc Depo-Medrol and 3 cc of Marcaine. This was accomplished using a 1-1/2 inch 25-gauge needle. Patient tolerated the procedure well without difficult. Ultrasound images were saved. Patient will followup with me in 3-4 weeks for recheck.

## 2012-05-02 ENCOUNTER — Ambulatory Visit: Payer: Self-pay | Admitting: Sports Medicine

## 2012-05-08 ENCOUNTER — Ambulatory Visit (INDEPENDENT_AMBULATORY_CARE_PROVIDER_SITE_OTHER): Payer: Self-pay | Admitting: *Deleted

## 2012-05-08 DIAGNOSIS — Z23 Encounter for immunization: Secondary | ICD-10-CM

## 2012-05-24 ENCOUNTER — Ambulatory Visit (INDEPENDENT_AMBULATORY_CARE_PROVIDER_SITE_OTHER): Payer: Self-pay | Admitting: Sports Medicine

## 2012-05-24 VITALS — BP 125/84 | HR 80 | Ht 63.0 in | Wt 130.0 lb

## 2012-05-24 DIAGNOSIS — S43429A Sprain of unspecified rotator cuff capsule, initial encounter: Secondary | ICD-10-CM

## 2012-05-24 DIAGNOSIS — M67919 Unspecified disorder of synovium and tendon, unspecified shoulder: Secondary | ICD-10-CM

## 2012-05-24 DIAGNOSIS — M79609 Pain in unspecified limb: Secondary | ICD-10-CM

## 2012-05-24 DIAGNOSIS — M75100 Unspecified rotator cuff tear or rupture of unspecified shoulder, not specified as traumatic: Secondary | ICD-10-CM

## 2012-05-24 DIAGNOSIS — M7580 Other shoulder lesions, unspecified shoulder: Secondary | ICD-10-CM

## 2012-05-24 MED ORDER — NITROGLYCERIN 0.2 MG/HR TD PT24: MEDICATED_PATCH | TRANSDERMAL | Status: DC

## 2012-05-25 NOTE — Progress Notes (Signed)
  Subjective:    Patient ID: Christina Burton, female    DOB: [birth year], 56 y.o.   MRN: [id number]  HPI Christina Burton comes in today for followup. Recent ultrasound-guided intra-articular injection of her right shoulder provided her with 2 weeks of pain relief but her symptoms have begun to return. She's been using one half a patch of nitroglycerin and has been doing this for several weeks. She's not sure whether it's helpful. She still getting lateral shoulder pain with overhead motion and reaching around behind her back. Associated weakness secondary to pain as well.    Review of Systems     Objective:   Physical Exam Well-developed, well-nourished. No acute distress.  Right shoulder shows full range of motion with pain at the extremes of all planes. She is still tender to palpation in the bicipital groove and has pain with O'Brien testing. Pain with Hawkins and Neers impingement tests. Positive empty can. Rotator cuff strength is 5/5 but reproducible of pain with resisted supraspinatus and external rotation. She is neurovascularly intact distally.       Assessment & Plan:  1. Chronic right shoulder pain secondary to partial rotator cuff tear/proximal biceps tendon tear  Once again, her finances limit we are able to do as far as treatment. I think she should continue with her nitroglycerin patches and we will call her in a refill. Continue with the home exercise program as well. I think she may ultimately benefit from surgical intervention, but she would like to wait until she gets insurance. I will see her back in 6 weeks for recheck.

## 2012-06-19 ENCOUNTER — Ambulatory Visit: Payer: Self-pay

## 2012-06-26 ENCOUNTER — Other Ambulatory Visit: Payer: Self-pay | Admitting: *Deleted

## 2012-06-26 DIAGNOSIS — R030 Elevated blood-pressure reading, without diagnosis of hypertension: Secondary | ICD-10-CM

## 2012-06-26 DIAGNOSIS — I10 Essential (primary) hypertension: Secondary | ICD-10-CM

## 2012-06-27 MED ORDER — HYDROCHLOROTHIAZIDE 25 MG PO TABS
25.0000 mg | ORAL_TABLET | Freq: Every day | ORAL | Status: DC
Start: 2012-06-26 — End: 2013-05-16

## 2012-07-09 ENCOUNTER — Ambulatory Visit (INDEPENDENT_AMBULATORY_CARE_PROVIDER_SITE_OTHER): Payer: No Typology Code available for payment source | Admitting: Sports Medicine

## 2012-07-09 ENCOUNTER — Ambulatory Visit (HOSPITAL_COMMUNITY)
Admission: RE | Admit: 2012-07-09 | Discharge: 2012-07-09 | Disposition: A | Discharge status: Home / Self Care | Payer: Self-pay | Source: Ambulatory Visit | Attending: Sports Medicine | Admitting: Sports Medicine

## 2012-07-09 VITALS — BP 138/93 | Ht 63.0 in | Wt 130.0 lb

## 2012-07-09 DIAGNOSIS — M75100 Unspecified rotator cuff tear or rupture of unspecified shoulder, not specified as traumatic: Secondary | ICD-10-CM

## 2012-07-09 DIAGNOSIS — M79646 Pain in unspecified finger(s): Secondary | ICD-10-CM

## 2012-07-09 DIAGNOSIS — M25519 Pain in unspecified shoulder: Secondary | ICD-10-CM

## 2012-07-09 DIAGNOSIS — S43429A Sprain of unspecified rotator cuff capsule, initial encounter: Secondary | ICD-10-CM

## 2012-07-09 DIAGNOSIS — M79609 Pain in unspecified limb: Secondary | ICD-10-CM

## 2012-07-09 DIAGNOSIS — M6530 Trigger finger, unspecified finger: Secondary | ICD-10-CM

## 2012-07-10 ENCOUNTER — Telehealth: Payer: Self-pay | Admitting: *Deleted

## 2012-07-10 NOTE — Telephone Encounter (Signed)
Message copied by Ocie Bob on Tue Jul 10, 2012 11:44 AM ------      Message from: Thurman Coyer      Created: Tue Jul 10, 2012 10:41 AM      Regarding: xrays       Please call Christina Burton and tell her that her x-rays of her shoulder looked fine. Please schedule a followup appointment with me in 4 weeks.            ----- Message -----         From: Rad Results In Interface         Sent: 07/09/2012   4:15 PM           To: Thurman Coyer, DO

## 2012-07-10 NOTE — Telephone Encounter (Signed)
Gave pt x-ray results.  She states she will call back to schedule f/u for 4 weeks.

## 2012-07-10 NOTE — Progress Notes (Signed)
  Subjective:    Patient ID: Christina Burton, female    DOB: Feb 15, 1956, 56 y.o.   MRN: [id number]  HPI Patient comes in today for followup on chronic right shoulder pain. Still struggling with shoulder pain despite topical nitroglycerin, which she has been on since July, and a home exercise program. Her main complaint continues to be catching in the shoulder which is intermittent and mainly associated with reaching away from her body. She is also complaining of some catching in the middle finger of her left hand. She's had trigger fingers in the right hand which have been injected with cortisone. She's also used a Band-Aid splint with some success on the right as well. Catching in the left middle finger started a couple of weeks ago. It is quite painful.    Review of Systems     Objective:   Physical Exam Well-developed, well-nourished. No acute distress  Right shoulder: Patient demonstrates full range of motion on today's exam. She does have pain with full internal and external rotation. Still some tenderness over the bicipital groove. She has 4+/5 strength with resisted supraspinatus and external rotation on the right in comparison to the left. There is some slight atrophy of the supraspinatus and infraspinatus muscle bellies. Pain with O'Brien's. Neurovascularly intact distally.  Left hand: Attention to the left middle finger shows active triggering with a palpable painful nodule at the A1 pulley. No soft tissue swelling, no erythema, no ecchymosis. Brisk capillary refill at the tip of the finger.  MSK ultrasound of the right shoulder. He hypoechoic area seen at the insertion of the infraspinatus on the previous scans is once again appreciated. There also appears to be a rather large hypoechoic area on the articular side of the distal supraspinatus. There are still some appreciable biceps tendon fibers in the bicipital groove.       Assessment & Plan:  1. Chronic right shoulder pain  likely secondary to rotator cuff tearing and probable concomitant labral tearing 2. Left middle finger trigger finger  Unfortunately, the patient is not making great progress in regards to her right shoulder. Nonetheless, she seems to have very little in the way of disability. Although she has not noticed any significant improvement on the nitroglycerin protocol she would like to continue with this. I don't think it would be harmful to continue a little longer. I would like to order an AP, outlet, and axillary x-ray of the right shoulder, mainly to rule out significant glenohumeral DJD and to ensure that she is not getting any significant cephalization of the humeral head.  The left middle turgor finger was injected today under ultrasound guidance. Ultrasound showed swelling in the flexor tendon. Tendon was injected just distal to the A1 pulley with 0.5 cc Depo-Medrol, 0.5 cc 0.5% Marcaine. This was done after consent was obtained. Patient tolerated this without difficulty.  Return to the office in one month for followup.  Consent obtained and verified. Time-out conducted. Noted no overlying erythema, induration, or other signs of local infection. Skin prepped in a sterile fashion. Topical analgesic spray: Ethyl chloride. Joint: left middle finger trigger finger Needle: 5/8 inch needle Completed without difficulty. Meds: 0.5cc depomedrol. 0.5cc 0.5% Marcaine  Advised to call if fevers/chills, erythema, induration, drainage, or persistent bleeding.

## 2012-07-17 ENCOUNTER — Telehealth: Payer: Self-pay | Admitting: *Deleted

## 2012-07-17 NOTE — Telephone Encounter (Signed)
Talked with pt after 5PM 07/16/12 - hit little toe on right foot - area bruised and hurts. Left message on cell # [phone] and with mother at home phone - pt aware and informed mother is aware. Appt with Dr Michail Sermon 07/18/12 9:15AM. Hilda Blades Jendayi Berling RN 07/17/12 11:30AM

## 2012-07-18 ENCOUNTER — Encounter: Payer: Self-pay | Admitting: Internal Medicine

## 2012-07-18 ENCOUNTER — Ambulatory Visit (INDEPENDENT_AMBULATORY_CARE_PROVIDER_SITE_OTHER): Payer: No Typology Code available for payment source | Admitting: Internal Medicine

## 2012-07-18 VITALS — BP 115/70 | HR 99 | Temp 96.4°F | Ht 63.0 in | Wt 135.2 lb

## 2012-07-18 DIAGNOSIS — M79674 Pain in right toe(s): Secondary | ICD-10-CM | POA: Insufficient documentation

## 2012-07-18 DIAGNOSIS — M79609 Pain in unspecified limb: Secondary | ICD-10-CM

## 2012-07-18 NOTE — Patient Instructions (Signed)
Start taking Tylenol for the toe pain as needed. Continue to use ice for the mild swelling. If you do not see an improvement within a week, please call the clinic to be re-assessed.

## 2012-07-18 NOTE — Progress Notes (Signed)
  Subjective:    Patient ID: Christina Burton, female    DOB: 06-07-1956, 56 y.o.   MRN: [id number]  HPI  Patient presents to clinic with complaints of right "pinky toe" pain after hitting it on her mother's walker twice. She reports that she has some swelling and pain which has improved since yesterday. She is able to walk without difficulty. History significant for anxiety disorder, obsessive-compulsive disorder, degenerative joint disease, and multiple previous musculoskeletal complaints.  Review of Systems  Constitutional: Negative for fever.  HENT: Negative.   Cardiovascular: Negative for leg swelling.  Musculoskeletal: Positive for joint swelling. Negative for gait problem.       5th toe right foot  Hematological: Does not bruise/bleed easily.       Objective:   Physical Exam  Constitutional: She is oriented to person, place, and time. She appears well-developed and well-nourished. No distress.  Musculoskeletal: Normal range of motion. She exhibits edema and tenderness.       Right foot: She exhibits tenderness and swelling. She exhibits normal range of motion, no deformity and no laceration.       Feet:  Neurological: She is alert and oriented to person, place, and time.  Skin: Skin is warm and dry. No rash noted. There is erythema.       Mild erythema of right 5th toe  Psychiatric: She has a normal mood and affect. Her behavior is normal. Thought content normal.          Assessment & Plan:  1. Toe pain, right: 5th metatarsal, likely contusion, less likely fracture but dislocation considered given her history of prior toe dislocation on the left foot w/o trauma -advised Tylenol and ice -return to clinic if no improvement in 1 week, can consider imaging +/- Sports Medicine referral

## 2012-08-20 ENCOUNTER — Ambulatory Visit (INDEPENDENT_AMBULATORY_CARE_PROVIDER_SITE_OTHER): Payer: No Typology Code available for payment source | Admitting: Sports Medicine

## 2012-08-20 ENCOUNTER — Encounter: Payer: Self-pay | Admitting: Sports Medicine

## 2012-08-20 VITALS — BP 118/79 | HR 88 | Ht 63.0 in | Wt 135.0 lb

## 2012-08-20 DIAGNOSIS — M75100 Unspecified rotator cuff tear or rupture of unspecified shoulder, not specified as traumatic: Secondary | ICD-10-CM

## 2012-08-20 DIAGNOSIS — S43429A Sprain of unspecified rotator cuff capsule, initial encounter: Secondary | ICD-10-CM

## 2012-08-20 NOTE — Progress Notes (Addendum)
  Subjective:    Patient ID: Christina Burton, female    DOB: 09/25/1955, 57 y.o.   MRN: [id number]  HPI Patient comes in today for followup. Still struggling with shoulder pain despite conservative treatment. Left middle trigger finger is improved after cortisone injection here. She continues to use topical nitroglycerin for rotator cuff injury. X-rays of her right shoulder done on 07/09/2012 showed some degenerative changes at the a.c. but was otherwise unremarkable. It does not appear to be any superior migration of the humeral head in relation to the glenoid.    Review of Systems     Objective:   Physical Exam Well-developed, well-nourished. No acute distress. Awake alert and oriented x3  Right shoulder: Full range of motion. There is pain with full internal rotation. Tenderness over the bicipital groove and persistent Popeye deformity. Rotator cuff strength is 5/5 but reproducible pain with resisted supraspinatus and external rotation. No atrophy. Neurovascularly intact distally.  Left hand: No active triggering of the left middle. There is still some tenderness to palpation at the A1 pulley. Brisk capillary refill with sensation intact to light-touch.       Assessment & Plan:  1. Persistent right shoulder pain secondary to rotator cuff tear/possible biceps tendon rupture 2. Improved left middle finger trigger finger  Patient has seen Dr. Amedeo Plenty in the past for her right shoulder. She is currently trying to acquire insurance. I recommended that she followup with Dr. Amedeo Plenty once she has insurance. He has failed conservative treatment to date. I've asked her to discontinue her nitroglycerin patches. If she has worsening pain she will contact me and I will consider restarting the patches for pain control. Unfortunately, sequential ultrasounds have not shown any real healing of the tears in her supraspinatus and infraspinatus tendons. Followup with me when necessary.

## 2012-08-20 NOTE — Patient Instructions (Addendum)
Thank you for coming in today  Your likely going to need surgery on the right shoulder. Once you had insurance I would suggest that he followup with Dr. Amedeo Plenty.  I recommend that you research tumeric as an herbal anti-inflammatory.  I will not schedule a followup appointment today but you are welcome to followup with me in the future if you need me.

## 2012-10-08 ENCOUNTER — Encounter: Payer: Self-pay | Admitting: Internal Medicine

## 2012-10-08 ENCOUNTER — Ambulatory Visit (INDEPENDENT_AMBULATORY_CARE_PROVIDER_SITE_OTHER): Payer: BC Managed Care – PPO | Admitting: Internal Medicine

## 2012-10-08 VITALS — BP 116/76 | HR 77 | Temp 97.1°F | Ht 63.0 in | Wt 141.2 lb

## 2012-10-08 DIAGNOSIS — M66329 Spontaneous rupture of flexor tendons, unspecified upper arm: Secondary | ICD-10-CM

## 2012-10-08 DIAGNOSIS — S46211D Strain of muscle, fascia and tendon of other parts of biceps, right arm, subsequent encounter: Secondary | ICD-10-CM

## 2012-10-08 DIAGNOSIS — IMO0002 Reserved for concepts with insufficient information to code with codable children: Secondary | ICD-10-CM

## 2012-10-08 DIAGNOSIS — W19XXXA Unspecified fall, initial encounter: Secondary | ICD-10-CM
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
## 2012-10-29 ENCOUNTER — Encounter: Payer: Self-pay | Admitting: Internal Medicine

## 2012-11-08 ENCOUNTER — Encounter: Payer: Self-pay | Admitting: Internal Medicine

## 2012-11-08 ENCOUNTER — Ambulatory Visit (INDEPENDENT_AMBULATORY_CARE_PROVIDER_SITE_OTHER): Payer: BC Managed Care – PPO | Admitting: Internal Medicine

## 2012-11-08 VITALS — BP 128/86 | HR 66 | Temp 97.0°F | Ht 63.0 in | Wt 140.7 lb

## 2012-11-08 DIAGNOSIS — F429 Obsessive-compulsive disorder, unspecified: Secondary | ICD-10-CM

## 2012-11-08 DIAGNOSIS — I10 Essential (primary) hypertension: Secondary | ICD-10-CM

## 2012-11-08 DIAGNOSIS — E785 Hyperlipidemia, unspecified: Secondary | ICD-10-CM

## 2012-11-08 LAB — CBC
HCT: 42.8 % (ref 36.0–46.0)
Hemoglobin: 14.8 g/dL (ref 12.0–15.0)
MCH: 30 pg (ref 26.0–34.0)
MCHC: 34.6 g/dL (ref 30.0–36.0)
RBC: 4.93 MIL/uL (ref 3.87–5.11)

## 2012-11-08 LAB — LIPID PANEL
HDL: 62 mg/dL (ref >39)
Total CHOL/HDL Ratio: 3.1 Ratio
VLDL: 17 mg/dL (ref 0–40)

## 2012-11-08 LAB — TSH: TSH: 1.437 u[IU]/mL (ref 0.350–4.500)

## 2012-11-08 MED ORDER — PAROXETINE HCL 20 MG PO TABS: 40.0000 mg | ORAL_TABLET | Freq: Every day | ORAL | Status: DC

## 2012-11-08 NOTE — Progress Notes (Signed)
Subjective:    Patient ID: Christina Burton, female    DOB: [birth year], 57 y.o.   MRN: [id number]  HPI Comments: Patient is a 57 year old woman who returns for regular followup.  1.  proximal biceps tendon rupture.   She has been following up with Sports medicine with cortisone injection and PT therapy. She has finished her cortisone injections and currently working with PT. Last OV at sports medicine was on 08/20/12 by Dr. Micheline Chapman who suggested for her to have surgical follow up with possible shoulder surgery. .  Patient has seen Dr. Amedeo Plenty in the past for her right shoulder. She has some insurance problem which is resolved now. She is here for follow up visit and surgical referral.   2. Annual labs - Lipid panel, CMP, TSH, CBC   Past Medical History  Diagnosis Date  . Sinusitis     chronic  . Mitral valve prolapse   . Ulcerative colitis     sees Dr. Collene Mares - normal colonoscopy in Nov 2011  . Anxiety disorder   . Obsessive compulsive disorder   . Allergic rhinitis    Current Outpatient Prescriptions  Medication Sig Dispense Refill  . acetaminophen (TYLENOL) 500 MG tablet Take 500 mg by mouth every 6 (six) hours as needed. For pain      . albuterol (PROVENTIL,VENTOLIN) 90 MCG/ACT inhaler Inhale 2 puffs into the lungs every 6 (six) hours as needed. For shortness of breath or wheezing       . Ascorbic Acid (VITAMIN C PO) Take 1 tablet by mouth daily.      Marland Kitchen azelastine (ASTELIN) 137 MCG/SPRAY nasal spray 1 spray by Nasal route daily. Use in each nostril as directed       . beclomethasone (QVAR) 80 MCG/ACT inhaler Inhale 2 puffs into the lungs daily.        . Bepotastine Besilate (BEPREVE) 1.5 % SOLN Place 1 drop into both eyes daily as needed. Use as directed by your ophthalmologist, Dr. Gershon Crane for allergies      . cetirizine (ZYRTEC) 10 MG tablet Take 10 mg by mouth daily.        . hydrochlorothiazide (HYDRODIURIL) 25 MG tablet Take 1 tablet (25 mg total) by mouth daily.  90 tablet  3   . levalbuterol (XOPENEX HFA) 45 MCG/ACT inhaler Inhale 1-2 puffs into the lungs every 4 (four) hours as needed. For shortness of breath      . mesalamine (CANASA) 1000 MG suppository Place 1,000 mg rectally daily as needed. Use as directed by your gastroenterologist for ulcerative colitis      . mesalamine (LIALDA) 1.2 G EC tablet Take 1,200 mg by mouth daily.        . mometasone (NASONEX) 50 MCG/ACT nasal spray 1 spray by Nasal route daily.        . Multiple Vitamin (MULTIVITAMIN WITH MINERALS) TABS Take 1 tablet by mouth daily.      . nitroGLYCERIN (NITRODUR - DOSED IN MG/24 HR) 0.2 mg/hr Use 1/2 patch daily to the affected area  30 patch  3  . omega-3 acid ethyl esters (LOVAZA) 1 G capsule Take 1 g by mouth daily.      Marland Kitchen PARoxetine (PAXIL) 20 MG tablet Take 2 tablets (40 mg total) by mouth daily.  60 tablet  5  . pravastatin (PRAVACHOL) 20 MG tablet take 1 tablet by mouth every evening  30 tablet  11  . RABEprazole (ACIPHEX) 20 MG tablet Take 20 mg by mouth 2 (two)  times daily.         No current facility-administered medications for this visit.   Family History  Problem Relation Age of Onset  . Hypertension Mother   . Hyperlipidemia Mother   . Diabetes Brother   . Diabetes Maternal Grandmother   . Lymphoma Father   . Seizures Sister    History   Social History  . Marital Status: Single    Spouse Name: N/A    Number of Children: N/A  . Years of Education: 14   Occupational History  . SECRETARY, clerical, collections    Social History Main Topics  . Smoking status: Never Smoker   . Smokeless tobacco: Never Used  . Alcohol Use: No  . Drug Use: No  . Sexually Active: No   Other Topics Concern  . None   Social History Narrative   Financial assistance approved for 85% discount at [facility] and has [facility] card per [employer]   02/16/2010         Review of Systems: No headache, fever, or sore throat. No shortness of breath or dyspnea on exertion. No chest pain, chest  pressure or palpitation No nausea, vomiting, or abdominal pain. No melena, diarrhea or incontinence. No muscle weakness.                   Denies depression. No appetite or weight changes.   Objective:  Physical Exam: Filed Vitals:   11/08/12 1448  BP: 128/86  Pulse: 66  Temp: 97 F (36.1 C)  TempSrc: Oral  Height: 5' 3"  (1.6 m)  Weight: 140 lb 11.2 oz (63.821 kg)  SpO2: 97%   General: NAD.  Neck: supple  Cardiovascular: RRR, No M/G/R Pulmonary/Chest: CTA Neurological: A+Ox3 Skin: Skin is warm and dry. No rash noted.  Psychiatric: She has a normal mood and affect. Judgment and thought content normal.  Right shoulder: Full range of motion. There is pain with full internal rotation. Tenderness over the bicipital groove and persistent Popeye deformity. Rotator cuff strength is 5/5 but reproducible pain with resisted supraspinatus and external rotation. No atrophy.    Assessment & Plan:

## 2012-11-08 NOTE — Patient Instructions (Addendum)
1. Will try to arrange an appt for you to see orthopedic surgeon. 2. Please call your sports medicine doctor if your shoulder pain worsen 3. Follow up in 3 months

## 2012-11-09 ENCOUNTER — Other Ambulatory Visit: Payer: Self-pay | Admitting: Internal Medicine

## 2012-11-09 LAB — COMPLETE METABOLIC PANEL WITH GFR
AST: 20 U/L (ref 0–37)
Albumin: 4.5 g/dL (ref 3.5–5.2)
Alkaline Phosphatase: 54 U/L (ref 39–117)
GFR, Est Non African American: >89 mL/min
Glucose, Bld: 81 mg/dL (ref 70–99)
Potassium: 3.3 mEq/L — ABNORMAL LOW (ref 3.5–5.3)
Sodium: 138 mEq/L (ref 135–145)
Total Bilirubin: 0.6 mg/dL (ref 0.3–1.2)
Total Protein: 6.8 g/dL (ref 6.0–8.3)

## 2012-11-09 MED ORDER — POTASSIUM CHLORIDE ER 10 MEQ PO TBCR: 40.0000 meq | EXTENDED_RELEASE_TABLET | Freq: Once | ORAL | Status: DC

## 2012-11-09 NOTE — Assessment & Plan Note (Signed)
She is at her baseline. No worsening of right shoulder pain. No weakness, numbness or tingling.   - will work on her surgical referral. - i instruct her that she should continue to follow up with her sports medicine clinic if her symptoms worsen while she is waiting for her appt with surgeon - she agrees with the plan.

## 2012-12-31 ENCOUNTER — Encounter: Payer: Self-pay | Admitting: Internal Medicine

## 2012-12-31 DIAGNOSIS — K219 Gastro-esophageal reflux disease without esophagitis: Secondary | ICD-10-CM | POA: Insufficient documentation

## 2013-01-18 LAB — HEMOCCULT SLIDES (X 3 CARDS)

## 2013-04-23 ENCOUNTER — Ambulatory Visit (INDEPENDENT_AMBULATORY_CARE_PROVIDER_SITE_OTHER): Payer: BC Managed Care – PPO | Admitting: *Deleted

## 2013-04-23 DIAGNOSIS — Z23 Encounter for immunization: Secondary | ICD-10-CM

## 2013-05-16 ENCOUNTER — Encounter: Payer: Self-pay | Admitting: Internal Medicine

## 2013-05-16 ENCOUNTER — Ambulatory Visit (INDEPENDENT_AMBULATORY_CARE_PROVIDER_SITE_OTHER): Payer: BC Managed Care – PPO | Admitting: Internal Medicine

## 2013-05-16 VITALS — BP 113/78 | HR 82 | Temp 97.9°F | Ht 63.0 in | Wt 140.1 lb

## 2013-05-16 DIAGNOSIS — I10 Essential (primary) hypertension: Secondary | ICD-10-CM

## 2013-05-16 DIAGNOSIS — Z1239 Encounter for other screening for malignant neoplasm of breast: Secondary | ICD-10-CM

## 2013-05-16 DIAGNOSIS — F429 Obsessive-compulsive disorder, unspecified: Secondary | ICD-10-CM

## 2013-05-16 DIAGNOSIS — E785 Hyperlipidemia, unspecified: Secondary | ICD-10-CM

## 2013-05-16 DIAGNOSIS — K5190 Ulcerative colitis, unspecified, without complications: Secondary | ICD-10-CM

## 2013-05-16 DIAGNOSIS — M249 Joint derangement, unspecified: Secondary | ICD-10-CM

## 2013-05-16 DIAGNOSIS — R030 Elevated blood-pressure reading, without diagnosis of hypertension: Secondary | ICD-10-CM

## 2013-05-16 DIAGNOSIS — S46211D Strain of muscle, fascia and tendon of other parts of biceps, right arm, subsequent encounter: Secondary | ICD-10-CM

## 2013-05-16 MED ORDER — HYDROCHLOROTHIAZIDE 25 MG PO TABS: 25.0000 mg | ORAL_TABLET | Freq: Every day | ORAL | Status: DC

## 2013-05-16 MED ORDER — PRAVASTATIN SODIUM 20 MG PO TABS: ORAL_TABLET | ORAL | Status: DC

## 2013-05-16 MED ORDER — PAROXETINE HCL 20 MG PO TABS: 40.0000 mg | ORAL_TABLET | Freq: Every day | ORAL | Status: DC

## 2013-05-16 NOTE — Assessment & Plan Note (Signed)
Stable. She was evaluated by Sports medicine and Orthopedic surgeon. No surgery is recs at present. She is doing exercise taught by her surgeon and reports improved symptoms and pain.  - will continue follow up with her sports medicine and surgeon as instructed.

## 2013-05-16 NOTE — Assessment & Plan Note (Signed)
She is managed by Dr. Collene Mares at present. She is scheduled to have Colonoscopy this year.   - instructed patient to continue to follow up with Dr. Collene Mares

## 2013-05-16 NOTE — Patient Instructions (Signed)
1. Follow up in 6 months.

## 2013-05-16 NOTE — Assessment & Plan Note (Signed)
Good Lipid control. Reports medical compliance.   Last LDL was 115 in April, 2014.  - will continue current therapy.

## 2013-05-16 NOTE — Assessment & Plan Note (Signed)
BP Readings from Last 3 Encounters:  05/16/13 113/78  11/08/12 128/86  10/08/12 116/76    Lab Results  Component Value Date   Angelly Spearing 138 11/08/2012   K 3.3* 11/08/2012   CREATININE 0.55 11/08/2012    Assessment: Blood pressure control: controlled Progress toward BP goal:  at goal Comments:   Plan: Medications:  continue current medications Educational resources provided:   Self management tools provided:   Other plans:   Good control of BP and medical compliance. Continue the current therapy.

## 2013-05-16 NOTE — Progress Notes (Signed)
Subjective:    Patient ID: Christina Burton, female    DOB: [birth year], 57 y.o.   MRN: [id number]  HPI Comments: Patient is a 57 year old woman with a PMH of HTN, HLD,  who returns for regular followup.  # HTN     Patient reports medical compliance and good blood pressure. She takes HCTZ and tolerates it well.     # HLD    She reports medical compliance with Pravachol and tolerates it well. LAst LDL was 114 in April 2014.   # Ulcerative Colitis    Stable. Followed by Dr. Collene Mares. She takes Cansa and Lialda and tolerates them well.   # proximal biceps tendon rupture.   She has been following up with Sports medicine with cortisone injection and PT therapy. She has finished her cortisone injections and currently working with PT. Last OV at sports medicine was on 08/20/12 by Dr. Micheline Chapman who suggested for her to have surgical follow up.   Patient has seen Dr. Amedeo Plenty does not suggest right shoulder shoulder.  Health maintenance 1. Mammogram referred 2. Pap Smear- deferred to next visit.     Past Medical History  Diagnosis Date  . Sinusitis     chronic  . Mitral valve prolapse   . Ulcerative colitis     sees Dr. Collene Mares - normal colonoscopy in Nov 2011  . Anxiety disorder   . Obsessive compulsive disorder   . Allergic rhinitis    Current Outpatient Prescriptions  Medication Sig Dispense Refill  . acetaminophen (TYLENOL) 500 MG tablet Take 500 mg by mouth every 6 (six) hours as needed. For pain      . albuterol (PROVENTIL,VENTOLIN) 90 MCG/ACT inhaler Inhale 2 puffs into the lungs every 6 (six) hours as needed. For shortness of breath or wheezing       . Ascorbic Acid (VITAMIN C PO) Take 1 tablet by mouth daily.      Marland Kitchen azelastine (ASTELIN) 137 MCG/SPRAY nasal spray 1 spray by Nasal route daily. Use in each nostril as directed       . beclomethasone (QVAR) 80 MCG/ACT inhaler Inhale 2 puffs into the lungs daily.        . Bepotastine Besilate (BEPREVE) 1.5 % SOLN Place 1 drop into both eyes  daily as needed. Use as directed by your ophthalmologist, Dr. Gershon Crane for allergies      . cetirizine (ZYRTEC) 10 MG tablet Take 10 mg by mouth daily.        . hydrochlorothiazide (HYDRODIURIL) 25 MG tablet Take 1 tablet (25 mg total) by mouth daily.  90 tablet  3  . levalbuterol (XOPENEX HFA) 45 MCG/ACT inhaler Inhale 1-2 puffs into the lungs every 4 (four) hours as needed. For shortness of breath      . mesalamine (CANASA) 1000 MG suppository Place 1,000 mg rectally daily as needed. Use as directed by your gastroenterologist for ulcerative colitis      . mesalamine (LIALDA) 1.2 G EC tablet Take 1,200 mg by mouth daily.        . mometasone (NASONEX) 50 MCG/ACT nasal spray 1 spray by Nasal route daily.        . Multiple Vitamin (MULTIVITAMIN WITH MINERALS) TABS Take 1 tablet by mouth daily.      . nitroGLYCERIN (NITRODUR - DOSED IN MG/24 HR) 0.2 mg/hr Use 1/2 patch daily to the affected area  30 patch  3  . omega-3 acid ethyl esters (LOVAZA) 1 G capsule Take 1 g by mouth daily.      Marland Kitchen  PARoxetine (PAXIL) 20 MG tablet Take 2 tablets (40 mg total) by mouth daily.  60 tablet  5  . pravastatin (PRAVACHOL) 20 MG tablet take 1 tablet by mouth every evening  30 tablet  11  . RABEprazole (ACIPHEX) 20 MG tablet Take 20 mg by mouth 2 (two) times daily.         No current facility-administered medications for this visit.   Family History  Problem Relation Age of Onset  . Hypertension Mother   . Hyperlipidemia Mother   . Diabetes Brother   . Diabetes Maternal Grandmother   . Lymphoma Father   . Seizures Sister    History   Social History  . Marital Status: Single    Spouse Name: N/A    Number of Children: N/A  . Years of Education: 14   Occupational History  . SECRETARY, clerical, collections    Social History Main Topics  . Smoking status: Never Smoker   . Smokeless tobacco: Never Used  . Alcohol Use: No  . Drug Use: No  . Sexual Activity: No   Other Topics Concern  . None   Social  History Narrative   Financial assistance approved for 85% discount at [facility] and has [facility] card per [employer]   02/16/2010         Review of Systems: No headache, fever, or sore throat. No shortness of breath or dyspnea on exertion. No chest pain, chest pressure or palpitation No nausea, vomiting, or abdominal pain. No melena, diarrhea or incontinence. No muscle weakness.                   Denies depression. No appetite or weight changes.   Objective:  Physical Exam: Filed Vitals:   05/16/13 1502  BP: 113/78  Pulse: 82  Temp: 97.9 F (36.6 C)  TempSrc: Oral  Height: 5' 3"  (1.6 m)  Weight: 140 lb 1.6 oz (63.549 kg)  SpO2: 98%   General: NAD.  Neck: supple  Cardiovascular: RRR, No M/G/R Pulmonary/Chest: CTA Neurological: A+Ox3 Skin: Skin is warm and dry. No rash noted.  Psychiatric: She has a normal mood and affect. Judgment and thought content normal.  Right shoulder: Full range of motion. There is mild pain with full internal rotation. Mild Tenderness over the bicipital groove and persistent Popeye deformity, which are improved. Rotator cuff strength is 5/5.    Assessment & Plan:

## 2013-05-20 NOTE — Progress Notes (Signed)
Case discussed with Dr. Li soon after the resident saw the patient. We reviewed the resident's history and exam and pertinent patient test results. I agree with the assessment, diagnosis, and plan of care documented in the resident's note. 

## 2013-07-03 ENCOUNTER — Encounter: Payer: Self-pay | Admitting: Internal Medicine

## 2013-11-08 ENCOUNTER — Other Ambulatory Visit: Payer: Self-pay | Admitting: Dermatology

## 2014-02-05 ENCOUNTER — Encounter: Payer: Self-pay | Admitting: Sports Medicine

## 2014-02-05 ENCOUNTER — Ambulatory Visit (INDEPENDENT_AMBULATORY_CARE_PROVIDER_SITE_OTHER): Payer: BC Managed Care – PPO | Admitting: Sports Medicine

## 2014-02-05 VITALS — BP 119/78 | Ht 63.0 in | Wt 140.0 lb

## 2014-02-05 DIAGNOSIS — S92919A Unspecified fracture of unspecified toe(s), initial encounter for closed fracture: Secondary | ICD-10-CM

## 2014-02-05 DIAGNOSIS — S92911A Unspecified fracture of right toe(s), initial encounter for closed fracture: Secondary | ICD-10-CM

## 2014-02-06 NOTE — Progress Notes (Signed)
   Subjective:    Patient ID: Christina Burton, female    DOB: 10-Jul-1956, 58 y.o.   MRN: [id number]  HPI chief complaint: Right great toe pain  Patient comes in today complaining of right great toe pain. She stubbed her great toe 2 days ago. Now has pain, swelling, and bruising at the IP joint. She has been able to walk with only a very slight limp. She denies pain more proximally in her foot.    Review of Systems     Objective:   Physical Exam Well-developed, well-nourished. No acute distress. Awake alert and oriented x3.  Right great toe: There is mild ecchymosis and slight swelling at the IP joint of the great toe. Tender to palpation across the dorsum of the joint. No tenderness more proximally at the MTP joint. No clinical angulation or malrotation. Flexor and extensor tendons are intact. Skin is intact. Patient walks with a slight limp.  Limited MSK ultrasound of the right great toe was performed. There is cortical irregularity seen at the proximal most aspect of the distal phalanx consistent with a small fracture here. This irregularity is best seen in the long view.       Assessment & Plan:  Right great toe pain secondary to distal phalanx fracture  Since the patient is ambulating with a very minimal limp, I think we can avoid immobilization in a postop shoe. We will instead buddy tape her first and second toes and she will return to the office in 3 weeks for reevaluation. We'll plan on repeating the ultrasound at that time as well. She will call with questions or concerns in the interim.

## 2014-02-26 ENCOUNTER — Ambulatory Visit (INDEPENDENT_AMBULATORY_CARE_PROVIDER_SITE_OTHER): Payer: BC Managed Care – PPO | Admitting: Sports Medicine

## 2014-02-26 ENCOUNTER — Encounter: Payer: Self-pay | Admitting: Sports Medicine

## 2014-02-26 VITALS — BP 131/81 | HR 73 | Ht 63.0 in | Wt 140.0 lb

## 2014-02-26 DIAGNOSIS — IMO0001 Reserved for inherently not codable concepts without codable children: Secondary | ICD-10-CM

## 2014-02-26 DIAGNOSIS — S92911D Unspecified fracture of right toe(s), subsequent encounter for fracture with routine healing: Secondary | ICD-10-CM

## 2014-02-26 NOTE — Progress Notes (Signed)
   Subjective:    Patient ID: Christina Burton, female    DOB: 20-Jan-1956, 58 y.o.   MRN: [id number]  HPI Patient comes in today for followup on her nondisplaced right great toe fracture. It's been 3 weeks since her injury. Pain has improved but not resolved. She has been buddy taping her first and second toes. She voices no new concerns.    Review of Systems     Objective:   Physical Exam Well-developed, well-nourished. No acute distress. Awake alert and oriented x3.  Right great toe: Previous soft tissue swelling and ecchymosis have resolved. There is minimal tenderness to palpation over the fracture site. No clinical angulation or malrotation. Flexor and extensor tendons remain intact. Brisk capillary refill. Patient walks with a slight limp.  MSK ultrasound of the right great toe was performed. Limited images in both long and short axis were obtained. There is abundant bony bridging across the fracture site at the proximalmost aspect of the distal phalanx. Findings consistent with a healing fracture.       Assessment & Plan:  Healing right great toe fracture  Patient will continue buddy taping with activity. Followup again in 3 weeks for repeat ultrasound. I anticipate discharge at that time. Call with questions or concerns in the interim.

## 2014-03-19 ENCOUNTER — Ambulatory Visit (INDEPENDENT_AMBULATORY_CARE_PROVIDER_SITE_OTHER): Payer: BC Managed Care – PPO | Admitting: Sports Medicine

## 2014-03-19 ENCOUNTER — Encounter: Payer: Self-pay | Admitting: Sports Medicine

## 2014-03-19 VITALS — BP 127/80 | Ht 63.0 in | Wt 140.0 lb

## 2014-03-19 DIAGNOSIS — IMO0001 Reserved for inherently not codable concepts without codable children: Secondary | ICD-10-CM

## 2014-03-19 DIAGNOSIS — S92911D Unspecified fracture of right toe(s), subsequent encounter for fracture with routine healing: Secondary | ICD-10-CM

## 2014-03-20 NOTE — Progress Notes (Signed)
   Subjective:    Patient ID: Christina Burton, female    DOB: 15-Sep-1955, 58 y.o.   MRN: [id number]  HPI Patient comes in today for followup on her right great toe fracture. It has been 6 weeks since her injury. She is doing well. She denies any current pain. No recurrent swelling. She has been buddy taping her first and second toes.   Review of Systems     Objective:   Physical Exam Well-developed, no acute distress  Right great toe: Full range of motion. No soft tissue swelling. No clinical angulation or malrotation. No tenderness to palpation at the IP joint. Flexor and extensor tendons remain intact. Neurovascularly intact distally. Walking without a limp.  Limited MSK ultrasound of the right great toe shows abundant callus around the fracture site consistent with a well healing fracture. Images were obtained both in long and short view.       Assessment & Plan:  6 weeks status post healing distal phalanx fracture, right great toe  Patient may discontinue buddy taping. She can continue to increase activity as tolerated. Patient has enough clinical and ultrasound evidence of fracture healing that I think that we can forego any future imaging for this fracture. She is discharged to followup when necessary.

## 2014-05-29 ENCOUNTER — Ambulatory Visit: Payer: BC Managed Care – PPO

## 2014-05-29 DIAGNOSIS — Z23 Encounter for immunization: Secondary | ICD-10-CM

## 2014-06-18 ENCOUNTER — Encounter: Payer: Self-pay | Admitting: Pulmonary Disease

## 2014-06-18 ENCOUNTER — Ambulatory Visit (INDEPENDENT_AMBULATORY_CARE_PROVIDER_SITE_OTHER): Payer: BC Managed Care – PPO | Admitting: Pulmonary Disease

## 2014-06-18 VITALS — BP 135/77 | HR 69 | Temp 97.6°F | Ht 63.0 in | Wt 142.7 lb

## 2014-06-18 DIAGNOSIS — M5430 Sciatica, unspecified side: Secondary | ICD-10-CM | POA: Insufficient documentation

## 2014-06-18 DIAGNOSIS — M5432 Sciatica, left side: Secondary | ICD-10-CM

## 2014-06-18 DIAGNOSIS — E785 Hyperlipidemia, unspecified: Secondary | ICD-10-CM

## 2014-06-18 DIAGNOSIS — K51919 Ulcerative colitis, unspecified with unspecified complications: Secondary | ICD-10-CM

## 2014-06-18 DIAGNOSIS — I10 Essential (primary) hypertension: Secondary | ICD-10-CM

## 2014-06-18 DIAGNOSIS — F429 Obsessive-compulsive disorder, unspecified: Secondary | ICD-10-CM

## 2014-06-18 DIAGNOSIS — F42 Obsessive-compulsive disorder: Secondary | ICD-10-CM | POA: Diagnosis not present

## 2014-06-18 LAB — CBC WITH DIFFERENTIAL/PLATELET
BASOS PCT: 0 % (ref 0–1)
Basophils Absolute: 0 10*3/uL (ref 0.0–0.1)
Eosinophils Absolute: 0.2 10*3/uL (ref 0.0–0.7)
Eosinophils Relative: 3 % (ref 0–5)
HEMATOCRIT: 40.5 % (ref 36.0–46.0)
HEMOGLOBIN: 14.2 g/dL (ref 12.0–15.0)
LYMPHS ABS: 2.9 10*3/uL (ref 0.7–4.0)
Lymphocytes Relative: 39 % (ref 12–46)
MCH: 29.6 pg (ref 26.0–34.0)
MCHC: 35.1 g/dL (ref 30.0–36.0)
MCV: 84.6 fL (ref 78.0–100.0)
MONOS PCT: 6 % (ref 3–12)
MPV: 10.2 fL (ref 9.4–12.4)
Monocytes Absolute: 0.5 10*3/uL (ref 0.1–1.0)
NEUTROS ABS: 3.9 10*3/uL (ref 1.7–7.7)
NEUTROS PCT: 52 % (ref 43–77)
Platelets: 247 10*3/uL (ref 150–400)
RBC: 4.79 MIL/uL (ref 3.87–5.11)
RDW: 13.8 % (ref 11.5–15.5)
WBC: 7.5 10*3/uL (ref 4.0–10.5)

## 2014-06-18 LAB — TSH: TSH: 1.398 u[IU]/mL (ref 0.350–4.500)

## 2014-06-18 LAB — LIPID PANEL
Cholesterol: 183 mg/dL (ref 0–200)
HDL: 64 mg/dL (ref >39)
LDL Cholesterol: 99 mg/dL (ref 0–99)
Total CHOL/HDL Ratio: 2.9 Ratio
Triglycerides: 99 mg/dL (ref <150)
VLDL: 20 mg/dL (ref 0–40)

## 2014-06-18 LAB — COMPLETE METABOLIC PANEL WITH GFR
ALBUMIN: 4.3 g/dL (ref 3.5–5.2)
ALT: 22 U/L (ref 0–35)
AST: 21 U/L (ref 0–37)
Alkaline Phosphatase: 51 U/L (ref 39–117)
BILIRUBIN TOTAL: 0.6 mg/dL (ref 0.2–1.2)
BUN: 13 mg/dL (ref 6–23)
CALCIUM: 9.7 mg/dL (ref 8.4–10.5)
CHLORIDE: 101 meq/L (ref 96–112)
CO2: 29 meq/L (ref 19–32)
Creat: 0.51 mg/dL (ref 0.50–1.10)
GLUCOSE: 89 mg/dL (ref 70–99)
POTASSIUM: 3.2 meq/L — AB (ref 3.5–5.3)
SODIUM: 139 meq/L (ref 135–145)
TOTAL PROTEIN: 7.2 g/dL (ref 6.0–8.3)

## 2014-06-18 MED ORDER — PRAVASTATIN SODIUM 20 MG PO TABS: ORAL_TABLET | ORAL | Status: DC

## 2014-06-18 MED ORDER — HYDROCHLOROTHIAZIDE 25 MG PO TABS: 25.0000 mg | ORAL_TABLET | Freq: Every day | ORAL | Status: DC

## 2014-06-18 MED ORDER — PAROXETINE HCL 20 MG PO TABS: 40.0000 mg | ORAL_TABLET | Freq: Every day | ORAL | Status: DC

## 2014-06-18 NOTE — Assessment & Plan Note (Signed)
BP Readings from Last 3 Encounters:  06/18/14 135/77  03/19/14 127/80  02/26/14 131/81    Lab Results  Component Value Date   NA 138 11/08/2012   K 3.3* 11/08/2012   CREATININE 0.55 11/08/2012    Assessment: Blood pressure control: controlled Progress toward BP goal:  at goal Comments: No episodes of hypotension  Plan: Medications:  continue current medications including hydrochlorothiazide 68m daily

## 2014-06-18 NOTE — Assessment & Plan Note (Signed)
Assessment: Saw Dr. Amedeo Plenty (ortho) who prescribed prednisone.  Plan: -Offered physical therapy referral. Pt deferred at this time and would like to try prednisone first -Take prednisone as directed

## 2014-06-18 NOTE — Progress Notes (Signed)
Subjective:    Patient ID: Christina Burton, female    DOB: 1955-11-18, 58 y.o.   MRN: [id number]  HPI [name] is a 58 year old woman with history of HTN, HLD, UC  Pain started 4-5 weeks ago. Has tried tylenol. Heat pad has helped. Saw Dr. Amedeo Plenty (ortho) recommended giving it more time. Was prescribed prednisone. Has not started it yet. Pain has been up and down. Has also used Biofreeze which also helps. Located in L buttock. Worse with sitting. Shoots down leg. Constant. No incontinence of stool or urine.  HTN: No episodes of dizziness, LH, tremulousness.  UC: Sees Dr. Collene Mares. Reports everything is stable. Denies diarrhea. No recent flares.  Pap smear: Sees Dr. Princess Bruins, MD. Last had pap smear February 2014 which per patient was normal.   Review of Systems  Constitutional: Negative for fever and chills.  HENT: Negative for sore throat.   Eyes: Negative for visual disturbance.  Respiratory: Negative for shortness of breath.   Cardiovascular: Negative for chest pain.  Gastrointestinal: Negative for nausea, abdominal pain and diarrhea.  Genitourinary: Negative for dysuria.  Musculoskeletal: Positive for arthralgias (chronic).  Skin: Negative for rash.  Neurological: Positive for numbness (L leg). Negative for dizziness, weakness and light-headedness.  Psychiatric/Behavioral: The patient is not nervous/anxious.     Past Medical History  Diagnosis Date  . Sinusitis     chronic  . Mitral valve prolapse   . Ulcerative colitis     sees Dr. Collene Mares - normal colonoscopy in Nov 2011  . Anxiety disorder   . Obsessive compulsive disorder   . Allergic rhinitis    Current Outpatient Prescriptions on File Prior to Visit  Medication Sig Dispense Refill  . acetaminophen (TYLENOL) 500 MG tablet Take 500 mg by mouth every 6 (six) hours as needed. For pain    . albuterol (PROVENTIL,VENTOLIN) 90 MCG/ACT inhaler Inhale 2 puffs into the lungs every 6 (six) hours as needed. For  shortness of breath or wheezing     . Ascorbic Acid (VITAMIN C PO) Take 1 tablet by mouth daily.    Marland Kitchen azelastine (ASTELIN) 137 MCG/SPRAY nasal spray 1 spray by Nasal route daily. Use in each nostril as directed     . beclomethasone (QVAR) 80 MCG/ACT inhaler Inhale 2 puffs into the lungs daily.      . Bepotastine Besilate (BEPREVE) 1.5 % SOLN Place 1 drop into both eyes daily as needed. Use as directed by your ophthalmologist, Dr. Gershon Crane for allergies    . cetirizine (ZYRTEC) 10 MG tablet Take 10 mg by mouth daily.      . hydrochlorothiazide (HYDRODIURIL) 25 MG tablet Take 1 tablet (25 mg total) by mouth daily. 90 tablet 4  . levalbuterol (XOPENEX HFA) 45 MCG/ACT inhaler Inhale 1-2 puffs into the lungs every 4 (four) hours as needed. For shortness of breath    . mesalamine (CANASA) 1000 MG suppository Place 1,000 mg rectally daily as needed. Use as directed by your gastroenterologist for ulcerative colitis    . mesalamine (LIALDA) 1.2 G EC tablet Take 1,200 mg by mouth daily.      . mometasone (NASONEX) 50 MCG/ACT nasal spray 1 spray by Nasal route daily.      . montelukast (SINGULAIR) 10 MG tablet     . Multiple Vitamin (MULTIVITAMIN WITH MINERALS) TABS Take 1 tablet by mouth daily.    . nitroGLYCERIN (NITRODUR - DOSED IN MG/24 HR) 0.2 mg/hr Use 1/2 patch daily to the affected area 30 patch  3  . omega-3 acid ethyl esters (LOVAZA) 1 G capsule Take 1 g by mouth daily.    Marland Kitchen omeprazole (PRILOSEC) 20 MG capsule     . PARoxetine (PAXIL) 20 MG tablet Take 2 tablets (40 mg total) by mouth daily. 180 tablet 4  . pravastatin (PRAVACHOL) 20 MG tablet take 1 tablet by mouth every evening 90 tablet 4  . RABEprazole (ACIPHEX) 20 MG tablet Take 20 mg by mouth 2 (two) times daily.       No current facility-administered medications on file prior to visit.   Today's Vitals   06/18/14 1420 06/18/14 1421  BP: 135/77   Pulse: 69   Temp: 97.6 F (36.4 C)   TempSrc: Oral   Height: 5' 3"  (1.6 m)   Weight: 142  lb 11.2 oz (64.728 kg)   SpO2: 97%   PainSc: 8  8   PainLoc: Buttocks    Objective:  Physical Exam  Constitutional: She is oriented to person, place, and time. She appears well-developed and well-nourished. No distress.  HENT:  Head: Normocephalic and atraumatic.  Eyes: EOM are normal.  Neck: Neck supple.  Cardiovascular: Normal rate and regular rhythm.   No murmur heard. Pulmonary/Chest: Effort normal and breath sounds normal. No respiratory distress. She has no wheezes. She has no rales.  Abdominal: Soft. There is no tenderness.  Musculoskeletal: Normal range of motion. She exhibits no edema.  Neurological: She is alert and oriented to person, place, and time. No cranial nerve deficit.  Skin: Skin is warm and dry.  Psychiatric: She has a normal mood and affect.   Assessment & Plan:  Please refer to problem based charting.

## 2014-06-18 NOTE — Patient Instructions (Signed)
Please follow up in 6-12 months. Call sooner if problems arise.  General Instructions:   Please bring your medicines with you each time you come to clinic.  Medicines may include prescription medications, over-the-counter medications, herbal remedies, eye drops, vitamins, or other pills.   Progress Toward Treatment Goals:  Treatment Goal 06/18/2014  Blood pressure at goal    Self Care Goals & Plans:  Self Care Goal 06/18/2014  Manage my medications take my medicines as prescribed; bring my medications to every visit  Monitor my health -  Eat healthy foods drink diet soda or water instead of juice or soda; eat foods that are low in salt; eat baked foods instead of fried foods  Be physically active find an activity I enjoy

## 2014-06-18 NOTE — Assessment & Plan Note (Addendum)
Assessment: No recent flares. Stable. On Lialda 2428m daily and Canasa 10025mprn. Followed by Dr. MaCollene MaresPlan: -Obtain CMP, CBC, TSH -Continue Lialda 24004maily and Canasa 1000m86mn  Addendum: CMP, CBC, TSH within normal limits. Potassium mildly low at 3.2. Will start on supplementation PO Potassium Chloride 20me16mily. Discussed with patient over phone.

## 2014-06-18 NOTE — Progress Notes (Signed)
Patient ID: Christina Burton, female   DOB: 10/23/1955, 58 y.o.   MRN: [id number] Internal Medicine Clinic Attending  I saw and evaluated the patient.  I personally confirmed the key portions of the history and exam documented by Dr. Randell Patient and I reviewed pertinent patient test results.  The assessment, diagnosis, and plan were formulated together and I agree with the documentation in the resident's note.

## 2014-06-18 NOTE — Assessment & Plan Note (Signed)
Assessment: Reports her symptoms are stable.  Plan: refilled Paxil 47m daily.

## 2014-06-18 NOTE — Assessment & Plan Note (Signed)
Assessment: on Pravachol 82m daily. Last lipid panel 10/2012  Plan: Obtain lipid panel today. Continue Pravachol 285mdaily.

## 2014-06-19 ENCOUNTER — Encounter: Payer: Self-pay | Admitting: Pulmonary Disease

## 2014-06-20 MED ORDER — POTASSIUM CHLORIDE ER 20 MEQ PO TBCR: 20.0000 meq | EXTENDED_RELEASE_TABLET | Freq: Every day | ORAL | Status: DC

## 2014-06-20 NOTE — Addendum Note (Signed)
Addended by: Jacques Earthly T on: 06/20/2014 03:47 PM   Modules accepted: Orders

## 2014-07-20 ENCOUNTER — Encounter: Payer: Self-pay | Admitting: Pulmonary Disease

## 2014-07-21 MED ORDER — POTASSIUM CHLORIDE ER 10 MEQ PO TBCR: 20.0000 meq | EXTENDED_RELEASE_TABLET | Freq: Every day | ORAL | Status: DC

## 2014-07-31 ENCOUNTER — Encounter: Payer: Self-pay | Admitting: Pulmonary Disease

## 2014-08-07 ENCOUNTER — Encounter: Payer: Self-pay | Admitting: Internal Medicine

## 2014-08-07 ENCOUNTER — Ambulatory Visit (INDEPENDENT_AMBULATORY_CARE_PROVIDER_SITE_OTHER): Payer: 59 | Admitting: Internal Medicine

## 2014-08-07 VITALS — BP 103/76 | HR 89 | Temp 97.7°F | Ht 63.0 in | Wt 138.9 lb

## 2014-08-07 DIAGNOSIS — M5432 Sciatica, left side: Secondary | ICD-10-CM

## 2014-08-07 DIAGNOSIS — J302 Other seasonal allergic rhinitis: Secondary | ICD-10-CM

## 2014-08-07 NOTE — Progress Notes (Signed)
Internal Medicine Clinic Attending  Case discussed with Dr. Trudee Kuster at the time of the visit.  We reviewed the resident's history and exam and pertinent patient test results.  I agree with the assessment, diagnosis, and plan of care documented in the resident's note.

## 2014-08-07 NOTE — Patient Instructions (Signed)
Thank you for coming to clinic today Christina Burton.  General instructions: -I placed a referral to orthopedics, so you can see Dr. Maxie Better about your sciatica. -I also referred you to physical therapy, which should help as well. -For your allergy shots, I referred your to allergy and immunology. -We will contact you with the status of the referrals as they are processed. -Please make a follow up appointment to return to clinic in 6 months.  Please bring your medicines with you each time you come.   Medicines may be  Eye drops  Herbal   Vitamins  Pills  Seeing these help [place] take care of you.

## 2014-08-07 NOTE — Progress Notes (Signed)
   Subjective:    Patient ID: Christina Burton, female    DOB: 1955/11/07, 59 y.o.   MRN: [id number]  HPI [name] is a 59 year old woman with history of HTN, HLD, and ulcerative colitis presenting for follow up of her sciatica and seasonal allergies.  She recently changes insurance programs and needs referrals to specialists who she has been seeing.  She reports having tendinitis in her right elbow 3 years ago and bicep rupture.  She has seen Dr. Amedeo Plenty of [facility] since then.  She also has chronic sciatica in her left hip for several years, and he referred her to Dr. Tonita Cong at [facility] to manage her pain.  She described the pain as burning pain in her buttock that occasionally shoots down her leg.  It has been getting worse over the last three months.  She saw Dr. Maxie Better who thought he pain may be a result of a bulging disk or nerve compression, and he would like her to have an MRI to evaluate it further.  She is also interested in pursuing physical therapy for her back.  She is currently taking gabapentin, which helps to some extent but it makes her drowsy.  She has a history of seasonal allergies, and she has been receiving allergy shots for the past 18 years.  She thinks the allergy shots have improved her symptoms significantly.  She would like a referral to allergy and immunology to continue to receive the shots.  She also takes Astelin, Zyrtex, and Nasonex to help with her allergies.  Review of Systems  Constitutional: Negative for fever, chills, activity change and appetite change.  HENT: Positive for congestion and rhinorrhea. Negative for sore throat.   Respiratory: Negative for cough, chest tightness and shortness of breath.   Cardiovascular: Negative for chest pain.  Gastrointestinal: Negative for nausea, vomiting, abdominal pain, diarrhea and constipation.  Genitourinary: Negative for dysuria and difficulty urinating.  Musculoskeletal: Positive for back  pain. Negative for myalgias and arthralgias.  Skin: Negative for rash.  Neurological: Positive for numbness. Negative for dizziness, syncope, weakness and headaches.       Objective:   Physical Exam  Constitutional: She is oriented to person, place, and time. She appears well-developed and well-nourished. No distress.  HENT:  Head: Normocephalic and atraumatic.  Mouth/Throat: Oropharyngeal exudate present.  Eyes: Conjunctivae and EOM are normal. Pupils are equal, round, and reactive to light.  Cardiovascular: Normal rate, regular rhythm and normal heart sounds.   Pulmonary/Chest: Effort normal and breath sounds normal. No respiratory distress.  Abdominal: Soft. Bowel sounds are normal. She exhibits no distension. There is no tenderness.  Musculoskeletal: Normal range of motion. She exhibits tenderness (Mild tenderness over left iliac crest.). She exhibits no edema.  Straight leg raise positive, shooting pain down left leg.  Neurological: She is alert and oriented to person, place, and time. She has normal reflexes. No cranial nerve deficit. She exhibits normal muscle tone.  Skin: Skin is warm and dry. No rash noted. No erythema.          Assessment & Plan:  Please see problem-based assessment and plan.

## 2014-08-07 NOTE — Assessment & Plan Note (Addendum)
Symptoms most consistent with sciatica.  She has not had an MRI in the past, and I agree that she would benefit from imaging.  She has been following with Dr. Tonita Cong, so I will refer her to him so he can continue his work-up.  Gabapentin helps, but it makes her drowsy.  I think physical therapy will help. -Continue gabapentin PRN. -Referred to PT. -Referred to Dr. Tonita Cong of orthopedics.

## 2014-08-07 NOTE — Assessment & Plan Note (Signed)
She has had long-standing seasonal allergies.  She has been following with Dr. Shaune Leeks of [facility] [place] [facility] who has been performing allergy shots, which have helped her significantly. She does have some nasal congestion today, which could be related to her allergies or the cold.  She alternates between Nasonex and Astelin.  She also has a rescue inhaler and takes Singulair and Qvar for mild asthma symptoms. -Continue Zyrtex, Nasonex, and Astelin. -Referral to allergy/asthma.

## 2014-08-15 ENCOUNTER — Encounter: Payer: Self-pay | Admitting: Pulmonary Disease

## 2014-08-28 ENCOUNTER — Encounter: Payer: Self-pay | Admitting: *Deleted

## 2014-09-23 ENCOUNTER — Encounter: Payer: Self-pay | Admitting: Pulmonary Disease

## 2014-09-28 ENCOUNTER — Encounter: Payer: Self-pay | Admitting: Pulmonary Disease

## 2014-09-29 ENCOUNTER — Ambulatory Visit: Payer: 59 | Attending: Internal Medicine

## 2014-09-29 DIAGNOSIS — I341 Nonrheumatic mitral (valve) prolapse: Secondary | ICD-10-CM | POA: Insufficient documentation

## 2014-09-29 DIAGNOSIS — K5190 Ulcerative colitis, unspecified, without complications: Secondary | ICD-10-CM | POA: Insufficient documentation

## 2014-09-29 DIAGNOSIS — G4700 Insomnia, unspecified: Secondary | ICD-10-CM | POA: Diagnosis not present

## 2014-09-29 DIAGNOSIS — M5432 Sciatica, left side: Secondary | ICD-10-CM

## 2014-09-29 DIAGNOSIS — I10 Essential (primary) hypertension: Secondary | ICD-10-CM | POA: Insufficient documentation

## 2014-09-29 DIAGNOSIS — R293 Abnormal posture: Secondary | ICD-10-CM

## 2014-09-29 DIAGNOSIS — M19049 Primary osteoarthritis, unspecified hand: Secondary | ICD-10-CM | POA: Diagnosis not present

## 2014-09-29 DIAGNOSIS — K219 Gastro-esophageal reflux disease without esophagitis: Secondary | ICD-10-CM | POA: Diagnosis not present

## 2014-09-29 DIAGNOSIS — M5442 Lumbago with sciatica, left side: Secondary | ICD-10-CM | POA: Diagnosis present

## 2014-09-29 DIAGNOSIS — M545 Low back pain: Secondary | ICD-10-CM

## 2014-09-29 NOTE — Therapy (Signed)
[place], [place], [address] Phone: [phone]   Fax:  [phone]  Physical Therapy Evaluation  Patient Details  Name: Christina Burton MRN: [id number] Date of Birth: 1955-10-27 Referring Provider:  Arman Filter, MD  Encounter Date: 09/29/2014      PT End of Session - 09/29/14 1531    Visit Number 1   Number of Visits 12   Date for PT Re-Evaluation 11/10/14   PT Start Time 0300   PT Stop Time 0400   PT Time Calculation (min) 60 min   Activity Tolerance Patient tolerated treatment well   Behavior During Therapy [facility] for tasks assessed/performed      Past Medical History  Diagnosis Date  . Sinusitis     chronic  . Mitral valve prolapse   . Ulcerative colitis     sees Dr. Collene Mares - normal colonoscopy in Nov 2011  . Anxiety disorder   . Obsessive compulsive disorder   . Allergic rhinitis     Past Surgical History  Procedure Laterality Date  . Carpal tunnel release    . Hernia repair    . Trigger finger release      right thumb  . Tonsilectomy, adenoidectomy, bilateral myringotomy and tubes      LMP 03/01/2010  Visit Diagnosis:  Sciatica, left - Plan: PT plan of care cert/re-cert  Left low back pain, with sciatica presence unspecified - Plan: PT plan of care cert/re-cert  Abnormal posture      Subjective Assessment - 09/29/14 1510    Symptoms She reports pain in lower back buttock and into posterior thigh.     Pertinent History Oct  and Nov she began to have pain in back with sitting. She saw Dr Tonita Cong and was medicated and was receiving PT until insurance changed .   She had only 3 visit. She was issued exercises a HEP but shoulders and elbows limit what she can do.    Limitations Sitting   How long can you sit comfortably? 45-60 min   How long can you stand comfortably? 45 min   How long can you walk comfortably? She does not walk any distance   Diagnostic tests MRI   Patient  Stated Goals She would like to alleviate pain to be able to sit 4-5 hours at computer.    Currently in Pain? Yes   Pain Score 3    Pain Location Back   Pain Orientation Left   Pain Descriptors / Indicators Discomfort;Numbness   Pain Type Chronic pain   Pain Radiating Towards Left   Pain Onset More than a month ago   Pain Frequency Constant   Aggravating Factors  Sitting   Pain Relieving Factors Medication, heat, prone lye, extension.    Effect of Pain on Daily Activities Limits sitting   Multiple Pain Sites No          [facility] PT Assessment - 09/29/14 1519    Assessment   Medical Diagnosis LT sciatica   Onset Date --  November or October 2015   Prior Therapy 3 visits in 2015   Precautions   Precautions None   Restrictions   Weight Bearing Restrictions No   Balance Screen   Has the patient fallen in the past 6 months No   Has the patient had a decrease in activity level because of a fear of falling?  No   Is the patient reluctant to leave their home because of a fear of  falling?  No   Prior Function   Level of Independence --  Back pain on and off for a year   AROM   Overall AROM Comments Hip motion normal   AROM Assessment Site Lumbar   Lumbar Flexion Normal with back pain   Lumbar Extension decreaed 75%   Lumbar - Right Side Bend decreaed 2/3   Lumbar - Left Side Bend decresed 2/3   Lumbar - Right Rotation decr 3/4   Lumbar - Left Rotation decr 3/4   Strength   Overall Strength Comments LE WNL   Palpation   Palpation Teder mostly inLT gluteal  but also tender posterior LT thigh and LT lower back . Mild tender on RT.           LT sacrum some lower in prone and LT PSS some higher than RT.                           PT Education - 09/29/14 1552    Education provided Yes   Education Details POC   Person(s) Educated Patient   Methods Explanation   Comprehension Verbalized understanding          PT Short Term Goals - 09/29/14 1546    PT SHORT  TERM GOAL #1   Title she will be independnet with inital HEp   Time 3   Period Weeks   Status New   PT SHORT TERM GOAL #2   Title She will report pain decreased 30-40%   Time 3   Period Weeks   Status New   PT SHORT TERM GOAL #3   Title she will report able to sit for 60 min with minimal discomfort   Time 3   Period Weeks   Status New           PT Long Term Goals - 09/29/14 1547    PT LONG TERM GOAL #1   Title She will be independnet with all HEP issued as of last visit   Time 6   Period Weeks   Status New   PT LONG TERM GOAL #2   Title She will report no leg symptoms and minor back pain with sitting at computer.    Time 6   Period Weeks   Status New               Plan - 09/29/14 1543    Clinical Impression Statement She is much improved with medication.Extension appears to be direction of most benefit. Tenderness in LT gluteals may contribute to pain inhip/leg.    Pt will benefit from skilled therapeutic intervention in order to improve on the following deficits Decreased range of motion;Pain;Increased muscle spasms;Decreased activity tolerance;Postural dysfunction   Rehab Potential Good   Clinical Impairments Affecting Rehab Potential other orthopedic issues   PT Frequency 2x / week  if improving at 4 weeks   PT Duration 6 weeks   PT Treatment/Interventions Moist Heat;Electrical Stimulation;Patient/family education;Therapeutic activities;Therapeutic exercise;Dry needling;Manual techniques;Ultrasound   PT Next Visit Plan Modalities, STW to gluteals , emphaiss on extenison exercies    PT Home Exercise Plan Cont extenison exercises   Consulted and Agree with Plan of Care Patient         Problem List Patient Active Problem List   Diagnosis Date Noted  . Sciatica 06/18/2014  . GERD (gastroesophageal reflux disease) 12/31/2012  . Toe pain, right 5th toe 07/18/2012  . Breast cancer screening 11/10/2011  .  Rotator cuff tear, right 11/08/2011  . Rotator  cuff tendonitis 05/26/2011  . Biceps tendon rupture, proximal 01/27/2011  . Hand contusion 01/27/2011  . Right shoulder pain 12/28/2010  . Insomnia 12/28/2010  . FOOT PAIN, LEFT 08/04/2010  . TRIGGER FINGER 06/03/2010  . Hyperlipidemia 03/17/2008  . DEGENERATIVE JOINT DISEASE, FINGERS 01/30/2008  . Hypertension 04/24/2007  . ANXIETY DISORDER 06/10/2006  . Obsessive-compulsive disorder 06/10/2006  . MITRAL VALVE PROLAPSE 06/10/2006  . SINUSITIS, CHRONIC 06/10/2006  . Allergic rhinitis 06/10/2006  . Ulcerative colitis 06/10/2006  . CARPAL TUNNEL RELEASE, HX OF 06/10/2006    Darrel Hoover PT 09/29/2014, 3:55 PM  [facility] [address] [place], [place], [address] Phone: [phone]   Fax:  [phone]

## 2014-10-02 ENCOUNTER — Ambulatory Visit: Payer: 59 | Attending: Internal Medicine | Admitting: Physical Therapy

## 2014-10-02 DIAGNOSIS — I10 Essential (primary) hypertension: Secondary | ICD-10-CM | POA: Insufficient documentation

## 2014-10-02 DIAGNOSIS — K5190 Ulcerative colitis, unspecified, without complications: Secondary | ICD-10-CM | POA: Diagnosis not present

## 2014-10-02 DIAGNOSIS — G4700 Insomnia, unspecified: Secondary | ICD-10-CM | POA: Diagnosis not present

## 2014-10-02 DIAGNOSIS — I341 Nonrheumatic mitral (valve) prolapse: Secondary | ICD-10-CM | POA: Insufficient documentation

## 2014-10-02 DIAGNOSIS — K219 Gastro-esophageal reflux disease without esophagitis: Secondary | ICD-10-CM | POA: Insufficient documentation

## 2014-10-02 DIAGNOSIS — M5442 Lumbago with sciatica, left side: Secondary | ICD-10-CM | POA: Insufficient documentation

## 2014-10-02 DIAGNOSIS — M19049 Primary osteoarthritis, unspecified hand: Secondary | ICD-10-CM | POA: Diagnosis not present

## 2014-10-02 DIAGNOSIS — M5432 Sciatica, left side: Secondary | ICD-10-CM

## 2014-10-02 DIAGNOSIS — M545 Low back pain: Secondary | ICD-10-CM

## 2014-10-02 DIAGNOSIS — R293 Abnormal posture: Secondary | ICD-10-CM

## 2014-10-02 NOTE — Therapy (Signed)
[place], [place], [address] Phone: [phone]   Fax:  [phone]  Physical Therapy Treatment  Patient Details  Name: Christina Burton MRN: [id number] Date of Birth: [birth year] Referring Provider:  Karren Cobble, MD  Encounter Date: 10/02/2014      PT End of Session - 10/02/14 1629    Visit Number 2   Number of Visits 12   Date for PT Re-Evaluation 11/10/14   PT Start Time 4827   PT Stop Time 1640   PT Time Calculation (min) 55 min   Activity Tolerance Patient tolerated treatment well   Behavior During Therapy [facility] for tasks assessed/performed      Past Medical History  Diagnosis Date  . Sinusitis     chronic  . Mitral valve prolapse   . Ulcerative colitis     sees Dr. Collene Mares - normal colonoscopy in Nov 2011  . Anxiety disorder   . Obsessive compulsive disorder   . Allergic rhinitis     Past Surgical History  Procedure Laterality Date  . Carpal tunnel release    . Hernia repair    . Trigger finger release      right thumb  . Tonsilectomy, adenoidectomy, bilateral myringotomy and tubes      LMP 03/01/2010  Visit Diagnosis:  Sciatica, left  Left low back pain, with sciatica presence unspecified  Abnormal posture      Subjective Assessment - 10/02/14 1549    Symptoms pt reports to continue to have pain in the low back with some occasional intermittent numbness to the mid L posterior thigh. She states she just finished taking her gabapentin, and has an occasional "twinge" from time to time.   Currently in Pain? Yes   Pain Score 2    Pain Location Back   Pain Orientation Left   Pain Descriptors / Indicators Aching;Burning   Pain Type Chronic pain   Pain Onset More than a month ago   Pain Frequency Constant                    [facility] Adult PT Treatment/Exercise - 10/02/14 1552    Knee/Hip Exercises: Aerobic   Stationary Bike Nu Step L3 x 8 minutes   Knee/Hip  Exercises: Supine   Hip Adduction Isometric AROM;Both;2 sets;10 reps  with yellow thera ball   Bridges AROM;Strengthening;Both;2 sets;15 reps   Knee/Hip Exercises: Sidelying   Other Sidelying Knee Exercises R sidelying reverse clam shell   Other Sidelying Knee Exercises     Manual Therapy   Manual Therapy Massage   Massage STM                 PT Education - 10/02/14 1629    Education provided Yes   Education Details stretches and HEP   Person(s) Educated Patient   Methods Explanation   Comprehension Verbalized understanding          PT Short Term Goals - 10/02/14 1634    PT SHORT TERM GOAL #1   Title she will be independnet with inital HEp   Time 3   Period Weeks   Status On-going   PT SHORT TERM GOAL #2   Title She will report pain decreased 30-40%   Time 3   Period Weeks   Status On-going   PT SHORT TERM GOAL #3   Title she will report able to sit for 60 min with minimal discomfort   Time 3   Period  Weeks   Status On-going           PT Long Term Goals - 10/02/14 1635    PT LONG TERM GOAL #1   Title She will be independnet with all HEP issued as of last visit   Time 6   Period Weeks   Status On-going   PT LONG TERM GOAL #2   Title She will report no leg symptoms and minor back pain with sitting at computer.    Time 6   Period Weeks   Status On-going               Plan - 10/02/14 1630    Clinical Impression Statement She has made progress with decreased pain since the last visit.   She has finished taking her gabapentin and hasn't noticed any changes in symptoms yet.  Palpation reveals tenderness at the left piriformis and reported some increased relief following  stretching and  therapeutic exercise. pt spoke with patient about possibly seeing if dry needling would be a benefit , and will try to get scheduled with a female PT to assist with patient comfort.    Rehab Potential Good   PT Next Visit Plan Modalities to address pain,  stretching of the glutes/piriformis, possible dry needling, strengthening of bil hip abd/ext   PT Home Exercise Plan Cont extenison exercises, and piriformis stretch.   Consulted and Agree with Plan of Care Patient        Problem List Patient Active Problem List   Diagnosis Date Noted  . Sciatica 06/18/2014  . GERD (gastroesophageal reflux disease) 12/31/2012  . Toe pain, right 5th toe 07/18/2012  . Breast cancer screening 11/10/2011  . Rotator cuff tear, right 11/08/2011  . Rotator cuff tendonitis 05/26/2011  . Biceps tendon rupture, proximal 01/27/2011  . Hand contusion 01/27/2011  . Right shoulder pain 12/28/2010  . Insomnia 12/28/2010  . FOOT PAIN, LEFT 08/04/2010  . TRIGGER FINGER 06/03/2010  . Hyperlipidemia 03/17/2008  . DEGENERATIVE JOINT DISEASE, FINGERS 01/30/2008  . Hypertension 04/24/2007  . ANXIETY DISORDER 06/10/2006  . Obsessive-compulsive disorder 06/10/2006  . MITRAL VALVE PROLAPSE 06/10/2006  . SINUSITIS, CHRONIC 06/10/2006  . Allergic rhinitis 06/10/2006  . Ulcerative colitis 06/10/2006  . CARPAL TUNNEL RELEASE, HX OF 06/10/2006   Starr Lake PT, DPT, LAT, ATC  10/02/2014  5:04 PM   [facility] [address] [place], [place], [address] Phone: [phone]   Fax:  [phone]

## 2014-10-07 ENCOUNTER — Ambulatory Visit: Payer: 59

## 2014-10-07 DIAGNOSIS — M545 Low back pain: Secondary | ICD-10-CM

## 2014-10-07 DIAGNOSIS — M5442 Lumbago with sciatica, left side: Secondary | ICD-10-CM | POA: Diagnosis not present

## 2014-10-07 NOTE — Patient Instructions (Signed)
Stretching bilat. knee to chest and lower trunk rotation. 30 sec x 2-3 RT and LT. Pilates scissors and shoulder bridge x 10-15 reps daily

## 2014-10-07 NOTE — Therapy (Signed)
[place], [place], [address] Phone: [phone]   Fax:  [phone]  Physical Therapy Treatment  Patient Details  Name: Christina Burton MRN: [id number] Date of Birth: 04-Jul-1956 Referring Provider:  Milagros Loll, MD  Encounter Date: 10/07/2014      PT End of Session - 10/07/14 1541    Visit Number 3   Number of Visits 12   Date for PT Re-Evaluation 11/10/14   PT Start Time 0300   PT Stop Time 0353   PT Time Calculation (min) 53 min   Activity Tolerance Patient tolerated treatment well   Behavior During Therapy [facility] for tasks assessed/performed      Past Medical History  Diagnosis Date  . Sinusitis     chronic  . Mitral valve prolapse   . Ulcerative colitis     sees Dr. Collene Mares - normal colonoscopy in Nov 2011  . Anxiety disorder   . Obsessive compulsive disorder   . Allergic rhinitis     Past Surgical History  Procedure Laterality Date  . Carpal tunnel release    . Hernia repair    . Trigger finger release      right thumb  . Tonsilectomy, adenoidectomy, bilateral myringotomy and tubes      LMP 03/01/2010  Visit Diagnosis:  Left low back pain, with sciatica presence unspecified      Subjective Assessment - 10/07/14 1508    Symptoms Pain up and down . I'm working on posture trying to stand straighter and use support.   Currently in Pain? Yes   Pain Score 2    Multiple Pain Sites No                    [facility] Adult PT Treatment/Exercise - 10/07/14 1508    Exercises   Exercises Lumbar   Lumbar Exercises: Stretches   Active Hamstring Stretch 1 rep;30 seconds   Double Knee to Chest Stretch 2 reps;30 seconds   Lower Trunk Rotation 1 rep;30 seconds  RT and Lt   Lumbar Exercises: Machines for Strengthening   Stationary Bike L2 5 minutes   Lumbar Exercises: Seated   Other Seated Lumbar Exercises Long sit streth x 30 sec on mat   Lumbar Exercises: Supine   Other Supine  Lumbar Exercises Arm opener RT and LT x 1 30 seconds.   Other Supine Lumbar Exercises Posterior pelvic tilt  with short hip lift to begin shoulde bridge x10   Modalities   Modalities Moist Heat   Moist Heat Therapy   Number Minutes Moist Heat 15 Minutes   Moist Heat Location --  back                PT Education - 10/07/14 1540    Education provided Yes   Education Details HEP stab and stretching trunk   Person(s) Educated Patient   Methods Explanation;Tactile cues;Handout;Verbal cues   Comprehension Returned demonstration          PT Short Term Goals - 10/02/14 1634    PT SHORT TERM GOAL #1   Title she will be independnet with inital HEp   Time 3   Period Weeks   Status On-going   PT SHORT TERM GOAL #2   Title She will report pain decreased 30-40%   Time 3   Period Weeks   Status On-going   PT SHORT TERM GOAL #3   Title she will report able to sit for 60 min  with minimal discomfort   Time 3   Period Weeks   Status On-going           PT Long Term Goals - 10/02/14 1635    PT LONG TERM GOAL #1   Title She will be independnet with all HEP issued as of last visit   Time 6   Period Weeks   Status On-going   PT LONG TERM GOAL #2   Title She will report no leg symptoms and minor back pain with sitting at computer.    Time 6   Period Weeks   Status On-going               Plan - 10/07/14 1542    Clinical Impression Statement Continues with mild pain and is able to move without restricton and [employer] with much cueing. She will need F/U with exericse   PT Next Visit Plan Progress HEP, modalities PRN, manual PRN   PT Home Exercise Plan REview HEP   Consulted and Agree with Plan of Care Patient        Problem List Patient Active Problem List   Diagnosis Date Noted  . Sciatica 06/18/2014  . GERD (gastroesophageal reflux disease) 12/31/2012  . Toe pain, right 5th toe 07/18/2012  . Breast cancer screening 11/10/2011  . Rotator cuff tear, right  11/08/2011  . Rotator cuff tendonitis 05/26/2011  . Biceps tendon rupture, proximal 01/27/2011  . Hand contusion 01/27/2011  . Right shoulder pain 12/28/2010  . Insomnia 12/28/2010  . FOOT PAIN, LEFT 08/04/2010  . TRIGGER FINGER 06/03/2010  . Hyperlipidemia 03/17/2008  . DEGENERATIVE JOINT DISEASE, FINGERS 01/30/2008  . Hypertension 04/24/2007  . ANXIETY DISORDER 06/10/2006  . Obsessive-compulsive disorder 06/10/2006  . MITRAL VALVE PROLAPSE 06/10/2006  . SINUSITIS, CHRONIC 06/10/2006  . Allergic rhinitis 06/10/2006  . Ulcerative colitis 06/10/2006  . CARPAL TUNNEL RELEASE, HX OF 06/10/2006    Darrel Hoover PT 10/07/2014, 3:44 PM  [facility] [address] [place], [place], [address] Phone: [phone]   Fax:  [phone]

## 2014-10-08 ENCOUNTER — Encounter: Payer: 59 | Admitting: Physical Therapy

## 2014-10-13 ENCOUNTER — Ambulatory Visit: Payer: 59 | Admitting: Physical Therapy

## 2014-10-13 DIAGNOSIS — R293 Abnormal posture: Secondary | ICD-10-CM

## 2014-10-13 DIAGNOSIS — M545 Low back pain: Secondary | ICD-10-CM

## 2014-10-13 DIAGNOSIS — M5432 Sciatica, left side: Secondary | ICD-10-CM

## 2014-10-13 DIAGNOSIS — M5442 Lumbago with sciatica, left side: Secondary | ICD-10-CM | POA: Diagnosis not present

## 2014-10-13 NOTE — Patient Instructions (Signed)
Hamstring Step 1   Straighten left knee. Keep knee level with other knee or on bolster. Hold 30 seconds. Relax knee by returning foot to start. Repeat 3 times.  Copyright  VHI. All rights reserved.

## 2014-10-13 NOTE — Therapy (Signed)
[place], [place], [address] Phone: [phone]   Fax:  [phone]  Physical Therapy Treatment  Patient Details  Name: Christina Burton MRN: [id number] Date of Birth: [birth year] Referring Provider:  Milagros Loll, MD  Encounter Date: 10/13/2014      PT End of Session - 10/13/14 1621    Visit Number 4   Number of Visits 12   Date for PT Re-Evaluation 11/10/14   PT Start Time 8185   PT Stop Time 1640   PT Time Calculation (min) 55 min   Activity Tolerance Patient tolerated treatment well   Behavior During Therapy [facility] for tasks assessed/performed      Past Medical History  Diagnosis Date  . Sinusitis     chronic  . Mitral valve prolapse   . Ulcerative colitis     sees Dr. Collene Mares - normal colonoscopy in Nov 2011  . Anxiety disorder   . Obsessive compulsive disorder   . Allergic rhinitis     Past Surgical History  Procedure Laterality Date  . Carpal tunnel release    . Hernia repair    . Trigger finger release      right thumb  . Tonsilectomy, adenoidectomy, bilateral myringotomy and tubes      There were no vitals filed for this visit.  Visit Diagnosis:  Left low back pain, with sciatica presence unspecified  Sciatica, left  Abnormal posture      Subjective Assessment - 10/13/14 1550    Symptoms She reports that the pain has gone down, and has been working on posture at work.   Currently in Pain? Yes   Pain Score 2    Pain Location Back   Pain Orientation Left   Pain Descriptors / Indicators Aching;Burning   Pain Type Chronic pain   Pain Frequency Intermittent                       [facility] Adult PT Treatment/Exercise - 10/13/14 1552    Lumbar Exercises: Stretches   Active Hamstring Stretch 2 reps;30 seconds   Double Knee to Chest Stretch 2 reps;30 seconds   Lower Trunk Rotation 2 reps;30 seconds   Lumbar Exercises: Aerobic   Stationary Bike --   Lumbar  Exercises: Supine   Bridge 10 reps  with abdominal draw in manuever   Other Supine Lumbar Exercises supine marching x 12  with abdominal draw in manuever   Other Supine Lumbar Exercises upper abdominal crunch   x 10    Lumbar Exercises: Sidelying   Clam 15 reps  2 x with red theraband.    Knee/Hip Exercises: Stretches   Active Hamstring Stretch 2 reps;30 seconds  using bed sheet   Piriformis Stretch 30 seconds   Knee/Hip Exercises: Aerobic   Stationary Bike L3 6 minutes   Modalities   Modalities Moist Heat   Moist Heat Therapy   Number Minutes Moist Heat 15 Minutes   Moist Heat Location Other (comment)  low back with patient lying on pad                PT Education - 10/13/14 1611    Education provided Yes   Education Details Posture, hamstring stretch   Person(s) Educated Patient   Methods Explanation   Comprehension Verbalized understanding          PT Short Term Goals - 10/02/14 1634    PT SHORT TERM GOAL #1   Title she  will be independnet with inital HEp   Time 3   Period Weeks   Status On-going   PT SHORT TERM GOAL #2   Title She will report pain decreased 30-40%   Time 3   Period Weeks   Status On-going   PT SHORT TERM GOAL #3   Title she will report able to sit for 60 min with minimal discomfort   Time 3   Period Weeks   Status On-going           PT Long Term Goals - 10/02/14 1635    PT LONG TERM GOAL #1   Title She will be independnet with all HEP issued as of last visit   Time 6   Period Weeks   Status On-going   PT LONG TERM GOAL #2   Title She will report no leg symptoms and minor back pain with sitting at computer.    Time 6   Period Weeks   Status On-going               Plan - 10/13/14 1621    Clinical Impression Statement [name] continues to demonstrate mild pain in the low back with some referral to the L gluteal area. Focused todays treatment on transverse abdominal training and postural control inc combination  with hamstring and piriformis stretching, which she reported felt good.   PT Next Visit Plan Progress with HEP, Modalities PRN and manual.         Problem List Patient Active Problem List   Diagnosis Date Noted  . Sciatica 06/18/2014  . GERD (gastroesophageal reflux disease) 12/31/2012  . Toe pain, right 5th toe 07/18/2012  . Breast cancer screening 11/10/2011  . Rotator cuff tear, right 11/08/2011  . Rotator cuff tendonitis 05/26/2011  . Biceps tendon rupture, proximal 01/27/2011  . Hand contusion 01/27/2011  . Right shoulder pain 12/28/2010  . Insomnia 12/28/2010  . FOOT PAIN, LEFT 08/04/2010  . TRIGGER FINGER 06/03/2010  . Hyperlipidemia 03/17/2008  . DEGENERATIVE JOINT DISEASE, FINGERS 01/30/2008  . Hypertension 04/24/2007  . ANXIETY DISORDER 06/10/2006  . Obsessive-compulsive disorder 06/10/2006  . MITRAL VALVE PROLAPSE 06/10/2006  . SINUSITIS, CHRONIC 06/10/2006  . Allergic rhinitis 06/10/2006  . Ulcerative colitis 06/10/2006  . CARPAL TUNNEL RELEASE, HX OF 06/10/2006   Starr Lake PT, DPT, LAT, ATC  10/13/2014  4:50 PM  [facility] [address] [place], [place], [address] Phone: [phone]   Fax:  [phone]

## 2014-10-15 ENCOUNTER — Ambulatory Visit: Payer: 59 | Admitting: Physical Therapy

## 2014-10-15 DIAGNOSIS — M5432 Sciatica, left side: Secondary | ICD-10-CM

## 2014-10-15 DIAGNOSIS — R293 Abnormal posture: Secondary | ICD-10-CM

## 2014-10-15 DIAGNOSIS — M5442 Lumbago with sciatica, left side: Secondary | ICD-10-CM | POA: Diagnosis not present

## 2014-10-15 DIAGNOSIS — M545 Low back pain: Secondary | ICD-10-CM

## 2014-10-15 NOTE — Therapy (Signed)
[place], [place], [address] Phone: [phone]   Fax:  [phone]  Physical Therapy Treatment  Patient Details  Name: Christina Burton MRN: [id number] Date of Birth: [birth year] Referring Provider:  Milagros Loll, MD  Encounter Date: 10/15/2014      PT End of Session - 10/15/14 1610    Visit Number 5   Number of Visits 12   Date for PT Re-Evaluation 11/10/14   PT Start Time 3545   PT Stop Time 1636   PT Time Calculation (min) 51 min   Activity Tolerance Patient tolerated treatment well   Behavior During Therapy [facility] for tasks assessed/performed      Past Medical History  Diagnosis Date  . Sinusitis     chronic  . Mitral valve prolapse   . Ulcerative colitis     sees Dr. Collene Mares - normal colonoscopy in Nov 2011  . Anxiety disorder   . Obsessive compulsive disorder   . Allergic rhinitis     Past Surgical History  Procedure Laterality Date  . Carpal tunnel release    . Hernia repair    . Trigger finger release      right thumb  . Tonsilectomy, adenoidectomy, bilateral myringotomy and tubes      There were no vitals filed for this visit.  Visit Diagnosis:  Left low back pain, with sciatica presence unspecified  Sciatica, left  Abnormal posture      Subjective Assessment - 10/15/14 1549    Symptoms I have been feeling good since the last visit.  She reports difficulty with tingling in the LE only when she crosses her legs while sitting.    Currently in Pain? Yes   Pain Score 1    Pain Location Back   Pain Orientation Left   Pain Descriptors / Indicators Aching   Pain Type Chronic pain   Pain Onset More than a month ago   Pain Frequency Intermittent                       [facility] Adult PT Treatment/Exercise - 10/15/14 1553    Lumbar Exercises: Stretches   Double Knee to Chest Stretch 2 reps;30 seconds   Lumbar Exercises: Aerobic   Stationary Bike Nu Step L5 x 8  min using legs only   Lumbar Exercises: Seated   Other Seated Lumbar Exercises Sciatic nerve gliding  x 10    supine with neck flex and pumping ankle    Other Seated Lumbar Exercises seated pelvic tilts x 10 with 3 sec hold  to help decrease sacral sitting   Lumbar Exercises: Supine   Bridge 10 reps   Other Supine Lumbar Exercises supine marching x 12   Other Supine Lumbar Exercises upper abdominal crunch    Lumbar Exercises: Sidelying   Clam 15 reps;Other (comment)  with red theraband   Knee/Hip Exercises: Stretches   Active Hamstring Stretch 2 reps;30 seconds   Piriformis Stretch 2 reps;30 seconds  PNF contract/relax technique 10 sec.    Knee/Hip Exercises: Standing   Other Standing Knee Exercises sit to stand x 8 with eccentric control   Modalities   Modalities Moist Heat   Moist Heat Therapy   Number Minutes Moist Heat 10 Minutes   Moist Heat Location Other (comment)  low back, with pt in supine                PT Education - 10/15/14 1746  Education provided Yes   Education Details piriformis stretching, and piriformis/sciatic nerve anatomy explanation   Person(s) Educated Patient   Methods Explanation   Comprehension Verbalized understanding          PT Short Term Goals - 10/15/14 1747    PT SHORT TERM GOAL #1   Title she will be independnet with inital HEp   Time 3   Period Weeks   Status Achieved   PT SHORT TERM GOAL #2   Title She will report pain decreased 30-40%   Time 3   Period Weeks   Status Achieved   PT SHORT TERM GOAL #3   Title she will report able to sit for 60 min with minimal discomfort   Time 3   Period Weeks   Status On-going           PT Long Term Goals - 10/02/14 1635    PT LONG TERM GOAL #1   Title She will be independnet with all HEP issued as of last visit   Time 6   Period Weeks   Status On-going   PT LONG TERM GOAL #2   Title She will report no leg symptoms and minor back pain with sitting at computer.    Time  6   Period Weeks   Status On-going               Plan - 10/15/14 1612    Clinical Impression Statement Ripley has made progress with decreased pain in the left low glute with minimal referring to the.  she reports feeling it more only when she crosses her legs, she performed the activity felt tingling and immediately stretced the piriformis and she reported relief immediatley.  PT used hip model to explain piriformis and sciatic anatomy.     PT Treatment/Interventions Moist Heat;Electrical Stimulation;Patient/family education;Therapeutic activities;Therapeutic exercise;Dry needling;Manual techniques   PT Next Visit Plan Progress with HEP, Modalities PRN and manual.    PT Home Exercise Plan REview HEP        Problem List Patient Active Problem List   Diagnosis Date Noted  . Sciatica 06/18/2014  . GERD (gastroesophageal reflux disease) 12/31/2012  . Toe pain, right 5th toe 07/18/2012  . Breast cancer screening 11/10/2011  . Rotator cuff tear, right 11/08/2011  . Rotator cuff tendonitis 05/26/2011  . Biceps tendon rupture, proximal 01/27/2011  . Hand contusion 01/27/2011  . Right shoulder pain 12/28/2010  . Insomnia 12/28/2010  . FOOT PAIN, LEFT 08/04/2010  . TRIGGER FINGER 06/03/2010  . Hyperlipidemia 03/17/2008  . DEGENERATIVE JOINT DISEASE, FINGERS 01/30/2008  . Hypertension 04/24/2007  . ANXIETY DISORDER 06/10/2006  . Obsessive-compulsive disorder 06/10/2006  . MITRAL VALVE PROLAPSE 06/10/2006  . SINUSITIS, CHRONIC 06/10/2006  . Allergic rhinitis 06/10/2006  . Ulcerative colitis 06/10/2006  . CARPAL TUNNEL RELEASE, HX OF 06/10/2006   Starr Lake PT, DPT, LAT, ATC  10/15/2014  5:51 PM   [facility] [address] [place], [place], [address] Phone: [phone]   Fax:  [phone]

## 2014-10-28 ENCOUNTER — Ambulatory Visit: Payer: 59 | Admitting: Physical Therapy

## 2014-10-28 DIAGNOSIS — R293 Abnormal posture: Secondary | ICD-10-CM

## 2014-10-28 DIAGNOSIS — M5442 Lumbago with sciatica, left side: Secondary | ICD-10-CM | POA: Diagnosis not present

## 2014-10-28 NOTE — Therapy (Signed)
[place], [place], [address] Phone: [phone]   Fax:  [phone]  Physical Therapy Treatment  Patient Details  Name: Christina Burton MRN: [id number] Date of Birth: 1955/11/14 Referring Provider:  Milagros Loll, MD  Encounter Date: 10/28/2014    Past Medical History  Diagnosis Date  . Sinusitis     chronic  . Mitral valve prolapse   . Ulcerative colitis     sees Dr. Collene Mares - normal colonoscopy in Nov 2011  . Anxiety disorder   . Obsessive compulsive disorder   . Allergic rhinitis     Past Surgical History  Procedure Laterality Date  . Carpal tunnel release    . Hernia repair    . Trigger finger release      right thumb  . Tonsilectomy, adenoidectomy, bilateral myringotomy and tubes      There were no vitals filed for this visit.  Visit Diagnosis:  Abnormal posture      Subjective Assessment - 10/28/14 1600    Symptoms Mild tingling lt gulteal.  Had a MRI Sat.  Does not know results yet.  Shooting pain is gone.     Pain Relieving Factors standing, rice bag, medication  stretching   Effect of Pain on Daily Activities moves around frequently.   Multiple Pain Sites No                       [facility] Adult PT Treatment/Exercise - 10/28/14 1557    Bed Mobility   Bed Mobility --  can demonstrate good technique, not yet a habit.   Lumbar Exercises: Aerobic   Stationary Bike Nustep   Lumbar Exercises: Seated   Other Seated Lumbar Exercises Sciatic nerve gliding  x 10    Seated with neck flex and pumping ankle    Lumbar Exercises: Supine   Bridge 10 reps   Other Supine Lumbar Exercises 5 alternating cues   Other Supine Lumbar Exercises 10   Lumbar Exercises: Sidelying   Clam 15 reps  cues   Knee/Hip Exercises: Stretches   Active Hamstring Stretch 3 reps;30 seconds   Passive Hamstring Stretch 3 reps;30 seconds   Piriformis Stretch 3 reps  30 second holds                 PT Education - 10/28/14 1804    Education provided Yes   Education Details Golfer's lift    Person(s) Educated Patient   Methods Explanation;Demonstration;Verbal cues   Comprehension Verbalized understanding;Returned demonstration          PT Short Term Goals - 10/28/14 1805    PT SHORT TERM GOAL #3   Title she will report able to sit for 60 min with minimal discomfort   Time 3   Period Weeks   Status On-going           PT Long Term Goals - 10/28/14 1806    PT LONG TERM GOAL #1   Title She will be independnet with all HEP issued as of last visit   Time 6   Period Weeks   Status On-going   PT LONG TERM GOAL #2   Title She will report no leg symptoms and minor back pain with sitting at computer.    Baseline mid gluteal tingling   Time 6   Period Weeks   Status On-going               Problem List Patient Active  Problem List   Diagnosis Date Noted  . Sciatica 06/18/2014  . GERD (gastroesophageal reflux disease) 12/31/2012  . Toe pain, right 5th toe 07/18/2012  . Breast cancer screening 11/10/2011  . Rotator cuff tear, right 11/08/2011  . Rotator cuff tendonitis 05/26/2011  . Biceps tendon rupture, proximal 01/27/2011  . Hand contusion 01/27/2011  . Right shoulder pain 12/28/2010  . Insomnia 12/28/2010  . FOOT PAIN, LEFT 08/04/2010  . TRIGGER FINGER 06/03/2010  . Hyperlipidemia 03/17/2008  . DEGENERATIVE JOINT DISEASE, FINGERS 01/30/2008  . Hypertension 04/24/2007  . ANXIETY DISORDER 06/10/2006  . Obsessive-compulsive disorder 06/10/2006  . MITRAL VALVE PROLAPSE 06/10/2006  . SINUSITIS, CHRONIC 06/10/2006  . Allergic rhinitis 06/10/2006  . Ulcerative colitis 06/10/2006  . CARPAL TUNNEL RELEASE, HX OF 06/10/2006  Melvenia Needles, PTA 10/28/2014 6:10 PM Phone: [phone] Fax: [phone]   [facility] 10/28/2014, 6:10 PM  [facility] [address] [place], [place],  [address] Phone: [phone]   Fax:  [phone]

## 2014-10-30 ENCOUNTER — Ambulatory Visit: Payer: 59 | Admitting: Physical Therapy

## 2014-10-30 DIAGNOSIS — R293 Abnormal posture: Secondary | ICD-10-CM

## 2014-10-30 DIAGNOSIS — M5442 Lumbago with sciatica, left side: Secondary | ICD-10-CM | POA: Diagnosis not present

## 2014-10-30 NOTE — Therapy (Signed)
[place], [place], [address] Phone: [phone]   Fax:  [phone]  Physical Therapy Treatment  Patient Details  Name: Christina Burton MRN: [id number] Date of Birth: 1955/11/15 Referring Provider:  Milagros Loll, MD  Encounter Date: 10/30/2014      PT End of Session - 10/30/14 1657    Visit Number 6   Number of Visits 12   Date for PT Re-Evaluation 11/10/14   PT Start Time 1500   PT Stop Time 1558   PT Time Calculation (min) 58 min   Activity Tolerance Patient tolerated treatment well;Patient limited by fatigue      Past Medical History  Diagnosis Date  . Sinusitis     chronic  . Mitral valve prolapse   . Ulcerative colitis     sees Dr. Collene Mares - normal colonoscopy in Nov 2011  . Anxiety disorder   . Obsessive compulsive disorder   . Allergic rhinitis     Past Surgical History  Procedure Laterality Date  . Carpal tunnel release    . Hernia repair    . Trigger finger release      right thumb  . Tonsilectomy, adenoidectomy, bilateral myringotomy and tubes      There were no vitals filed for this visit.  Visit Diagnosis:  Abnormal posture      Subjective Assessment - 10/30/14 1522    Symptoms Saw Md had an MRI, Has a bulging disc that is near a nerve .  Tries to use good body mechanics.  He wants her to continue PT.   Currently in Pain? Yes   Pain Score 2    Pain Location Back   Pain Orientation Left   Pain Descriptors / Indicators Numbness  tingling   Pain Type Chronic pain   Pain Radiating Towards Lt gluteal   Pain Frequency Intermittent   Aggravating Factors  sitting, doing something wrong.   Pain Relieving Factors Tylenol, good posture   Effect of Pain on Daily Activities painful on steps   Multiple Pain Sites No                       [facility] Adult PT Treatment/Exercise - 10/30/14 1525    Lumbar Exercises: Stretches   Active Hamstring Stretch 2 reps;30  seconds   Piriformis Stretch 3 reps  10 second holds   Lumbar Exercises: Standing   Functional Squats 5 reps   Lifting --  simulated, cues needed   Lumbar Exercises: Seated   Sit to Stand --  3 reps cued to stick chest out during   Lumbar Exercises: Supine   Ab Set 5 reps  cues for breathing   Clam 5 reps   Bridge 10 reps   Knee/Hip Exercises: Stretches   Active Hamstring Stretch 3 reps;30 seconds   Knee/Hip Exercises: Standing   Forward Step Up 10 reps  each, added to home exercise   Knee/Hip Exercises: Supine   Short Arc Quad Sets Both;3 sets   Short Arc Quad Sets Limitations 5,   8 LBS increases knee soreness. RT   Bridges 10 reps  legs over bolster   Modalities   Modalities Moist Heat   Moist Heat Therapy   Number Minutes Moist Heat 15 Minutes   Moist Heat Location --  back                PT Education - 10/30/14 1536    Education provided Yes  Education Details ADL, lift practice, step up   Person(s) Educated Patient   Methods Explanation;Demonstration;Handout;Verbal cues   Comprehension Verbalized understanding          PT Short Term Goals - 10/30/14 1659    PT SHORT TERM GOAL #1   Title she will be independnet with inital HEp   Time 3   Period Weeks   Status Achieved   PT SHORT TERM GOAL #2   Title She will report pain decreased 30-40%   Time 3   Period Weeks   Status Achieved   PT SHORT TERM GOAL #3   Title she will report able to sit for 60 min with minimal discomfort   Time 3   Period Weeks   Status On-going           PT Long Term Goals - 10/30/14 1700    PT LONG TERM GOAL #1   Title She will be independnet with all HEP issued as of last visit   Time 6   Period Weeks   Status On-going   PT LONG TERM GOAL #2   Title She will report no leg symptoms and minor back pain with sitting at computer.    Baseline mid gluteal tingling   Time 6   Period Weeks               Plan - 10/30/14 1658    Clinical Impression  Statement Patient needs to find exercise she enjoys for more compliance.  Needs more quad strength to be able to have good body mechanics.  Able to make progress toward home exercise goal        Problem List Patient Active Problem List   Diagnosis Date Noted  . Sciatica 06/18/2014  . GERD (gastroesophageal reflux disease) 12/31/2012  . Toe pain, right 5th toe 07/18/2012  . Breast cancer screening 11/10/2011  . Rotator cuff tear, right 11/08/2011  . Rotator cuff tendonitis 05/26/2011  . Biceps tendon rupture, proximal 01/27/2011  . Hand contusion 01/27/2011  . Right shoulder pain 12/28/2010  . Insomnia 12/28/2010  . FOOT PAIN, LEFT 08/04/2010  . TRIGGER FINGER 06/03/2010  . Hyperlipidemia 03/17/2008  . DEGENERATIVE JOINT DISEASE, FINGERS 01/30/2008  . Hypertension 04/24/2007  . ANXIETY DISORDER 06/10/2006  . Obsessive-compulsive disorder 06/10/2006  . MITRAL VALVE PROLAPSE 06/10/2006  . SINUSITIS, CHRONIC 06/10/2006  . Allergic rhinitis 06/10/2006  . Ulcerative colitis 06/10/2006  . CARPAL TUNNEL RELEASE, HX OF 06/10/2006   Melvenia Needles, PTA 10/30/2014 5:02 PM Phone: [phone] Fax: [phone]  Melvenia Needles 10/30/2014, [facility] [address] [place], [place], [address] Phone: [phone]   Fax:  [phone]

## 2014-10-30 NOTE — Patient Instructions (Addendum)
Sleeping on Back  Place pillow under knees. A pillow with cervical support and a roll around waist are also helpful. Copyright  VHI. All rights reserved.  Sleeping on Side Place pillow between knees. Use cervical support under neck and a roll around waist as needed. Copyright  VHI. All rights reserved.   Sleeping on Stomach   If this is the only desirable sleeping position, place pillow under lower legs, and under stomach or chest as needed.  Posture - Sitting   Sit upright, head facing forward. Try using a roll to support lower back. Keep shoulders relaxed, and avoid rounded back. Keep hips level with knees. Avoid crossing legs for long periods. Stand to Sit / Sit to Stand   To sit: Bend knees to lower self onto front edge of chair, then scoot back on seat. To stand: Reverse sequence by placing one foot forward, and scoot to front of seat. Use rocking motion to stand up.   Work Height and Reach  Ideal work height is no more than 2 to 4 inches below elbow level when standing, and at elbow level when sitting. Reaching should be limited to arm's length, with elbows slightly bent.  Bending  Bend at hips and knees, not back. Keep feet shoulder-width apart.    Posture - Standing   Good posture is important. Avoid slouching and forward head thrust. Maintain curve in low back and align ears over shoul- ders, hips over ankles.  Alternating Positions   Alternate tasks and change positions frequently to reduce fatigue and muscle tension. Take rest breaks. Computer Work   Position work to Programmer, multimedia. Use proper work and seat height. Keep shoulders back and down, wrists straight, and elbows at right angles. Use chair that provides full back support. Add footrest and lumbar roll as needed.  Getting Into / Out of Car  Lower self onto seat, scoot back, then bring in one leg at a time. Reverse sequence to get out.  Dressing  Lie on back to pull socks or slacks over feet, or sit  and bend leg while keeping back straight.    Housework - Sink  Place one foot on ledge of cabinet under sink when standing at sink for prolonged periods.   Pushing / Pulling  Pushing is preferable to pulling. Keep back in proper alignment, and use leg muscles to do the work.  Deep Squat   Squat and lift with both arms held against upper trunk. Tighten stomach muscles without holding breath. Use smooth movements to avoid jerking.  Avoid Twisting   Avoid twisting or bending back. Pivot around using foot movements, and bend at knees if needed when reaching for articles.  Carrying Luggage   Distribute weight evenly on both sides. Use a cart whenever possible. Do not twist trunk. Move body as a unit.   Lifting Principles .Maintain proper posture and head alignment. .Slide object as close as possible before lifting. .Move obstacles out of the way. .Test before lifting; ask for help if too heavy. .Tighten stomach muscles without holding breath. .Use smooth movements; do not jerk. .Use legs to do the work, and pivot with feet. .Distribute the work load symmetrically and close to the center of trunk. .Push instead of pull whenever possible.   Ask For Help   Ask for help and delegate to others when possible. Coordinate your movements when lifting together, and maintain the low back curve.  Log Roll   Lying on back, bend left knee and place left  arm across chest. Roll all in one movement to the right. Reverse to roll to the left. Always move as one unit. Housework - Sweeping  Use long-handled equipment to avoid stooping.   Housework - Wiping  Position yourself as close as possible to reach work surface. Avoid straining your back.  Laundry - Unloading Wash   To unload small items at bottom of washer, lift leg opposite to arm being used to reach.  [place] close to area to be raked. Use arm movements to do the work. Keep back straight and avoid  twisting.     Cart  When reaching into cart with one arm, lift opposite leg to keep back straight.   Getting Into / Out of Bed  Lower self to lie down on one side by raising legs and lowering head at the same time. Use arms to assist moving without twisting. Bend both knees to roll onto back if desired. To sit up, start from lying on side, and use same move-ments in reverse. Housework - Vacuuming  Hold the vacuum with arm held at side. Step back and forth to move it, keeping head up. Avoid twisting.   Laundry - IT consultant so that bending and twisting can be avoided.   Laundry - Unloading Dryer  Squat down to reach into clothes dryer or use a reacher.  Gardening - Weeding / Probation officer or Kneel. Knee pads may be helpful.                   Anterior Step-Up   Stand with _7-8__ inch step placed in A direction. Step onto step with right foot without pushing off with the ground foot. Finish with ground foot in touch balance on step and return _3 to 15__ times. _1-2__ sets 1-2___ times per day.  Hold onto rail for safety  http://gglj.exer.us/161   Copyright  VHI. All rights reserved.

## 2014-11-03 ENCOUNTER — Ambulatory Visit: Payer: 59 | Attending: Internal Medicine | Admitting: Physical Therapy

## 2014-11-03 DIAGNOSIS — G4700 Insomnia, unspecified: Secondary | ICD-10-CM | POA: Diagnosis not present

## 2014-11-03 DIAGNOSIS — M19049 Primary osteoarthritis, unspecified hand: Secondary | ICD-10-CM | POA: Insufficient documentation

## 2014-11-03 DIAGNOSIS — I341 Nonrheumatic mitral (valve) prolapse: Secondary | ICD-10-CM | POA: Insufficient documentation

## 2014-11-03 DIAGNOSIS — I10 Essential (primary) hypertension: Secondary | ICD-10-CM | POA: Insufficient documentation

## 2014-11-03 DIAGNOSIS — K219 Gastro-esophageal reflux disease without esophagitis: Secondary | ICD-10-CM | POA: Diagnosis not present

## 2014-11-03 DIAGNOSIS — M545 Low back pain: Secondary | ICD-10-CM

## 2014-11-03 DIAGNOSIS — R293 Abnormal posture: Secondary | ICD-10-CM

## 2014-11-03 DIAGNOSIS — M5442 Lumbago with sciatica, left side: Secondary | ICD-10-CM | POA: Diagnosis present

## 2014-11-03 DIAGNOSIS — K5190 Ulcerative colitis, unspecified, without complications: Secondary | ICD-10-CM | POA: Diagnosis not present

## 2014-11-03 NOTE — Therapy (Signed)
[place], [place], [address] Phone: [phone]   Fax:  [phone]  Physical Therapy Treatment  Patient Details  Name: Christina Burton MRN: [id number] Date of Birth: 23-Nov-1955 Referring Provider:  Milagros Loll, MD  Encounter Date: 11/03/2014      PT End of Session - 11/03/14 1758    Visit Number 7   Number of Visits 12   Date for PT Re-Evaluation 11/10/14   PT Start Time 1503   PT Stop Time 1600   PT Time Calculation (min) 57 min   Activity Tolerance Patient tolerated treatment well      Past Medical History  Diagnosis Date  . Sinusitis     chronic  . Mitral valve prolapse   . Ulcerative colitis     sees Dr. Collene Mares - normal colonoscopy in Nov 2011  . Anxiety disorder   . Obsessive compulsive disorder   . Allergic rhinitis     Past Surgical History  Procedure Laterality Date  . Carpal tunnel release    . Hernia repair    . Trigger finger release      right thumb  . Tonsilectomy, adenoidectomy, bilateral myringotomy and tubes      There were no vitals filed for this visit.  Visit Diagnosis:  Abnormal posture  Left low back pain, with sciatica presence unspecified      Subjective Assessment - 11/03/14 1511    Subjective Gtuteal 2/10   worse with sitting   Pain Location --  gluteal   Pain Orientation Left   Pain Descriptors / Indicators Numbness   Pain Frequency Constant   Aggravating Factors  sitting, poor body mechanics   Pain Relieving Factors good body bechanics, change of position.  prone  with pillow   Effect of Pain on Daily Activities Avoids full flights of stairs   Multiple Pain Sites No                       [facility] Adult PT Treatment/Exercise - 11/03/14 1530    Posture/Postural Control   Posture Comments Practiced modified reaching for things on floor, placing hand on thigh and placing elbow there if she needed to get lower. able to  correctly,   Also able to do full squat. neesed extra time to raise from this, weakness vs pain.   Lumbar Exercises: Stretches   Piriformis Stretch 3 reps   Lumbar Exercises: Machines for Strengthening   Other Lumbar Machine Exercise Nustep 6 minutes, Level 4    Lumbar Exercises: Standing   Heel Raises 10 reps  single legs   Side Lunge --  mini squat with small side steps for hip strengthening.   Lumbar Exercises: Supine   Ab Set 10 reps   Bent Knee Raise 10 reps   Bridge 15 reps   Lumbar Exercises: Sidelying   Clam 15 reps  cues   Knee/Hip Exercises: Standing   Heel Raises 10 reps  single each   Lateral Step Up 10 reps  cues   Forward Step Up 10 reps  needed cues to keep knee from locking into extension   Knee/Hip Exercises: Supine   Bridges 10 reps   Knee/Hip Exercises: Sidelying   Clams 10  cued minor for moving hip back with lift.   Moist Heat Therapy   Number Minutes Moist Heat 15 Minutes   Moist Heat Location --  back  PT Education - 11/03/14 1757    Education provided Yes   Education Details lifting techniques, squats,modifications   Person(s) Educated Patient   Methods Explanation;Demonstration;Verbal cues   Comprehension Verbalized understanding;Returned demonstration          PT Short Term Goals - 11/03/14 1801    PT SHORT TERM GOAL #3   Title she will report able to sit for 60 min with minimal discomfort   Baseline cannot  sit 60 minutes.   Time 3   Period Weeks   Status On-going           PT Long Term Goals - 11/03/14 1802    PT LONG TERM GOAL #1   Title She will be independnet with all HEP issued as of last visit   Time 6   Period Weeks   Status On-going   PT LONG TERM GOAL #2   Title She will report no leg symptoms and minor back pain with sitting at computer.    Baseline numbness, less leg pain (not mentioned today by patient)   Time 6   Period Weeks   Status On-going               Plan - 11/03/14 1759     Clinical Impression Statement education continued at her request for lifting practice going over how to do if there is nothing around to hold onto .  Patient is determined to do things with good posture.  Strengthening continued  for core and Legs which will help with her lifting/ ADL.   PT Next Visit Plan answer any lifting questions, Hip/leg/core strengthening.        Problem List Patient Active Problem List   Diagnosis Date Noted  . Sciatica 06/18/2014  . GERD (gastroesophageal reflux disease) 12/31/2012  . Toe pain, right 5th toe 07/18/2012  . Breast cancer screening 11/10/2011  . Rotator cuff tear, right 11/08/2011  . Rotator cuff tendonitis 05/26/2011  . Biceps tendon rupture, proximal 01/27/2011  . Hand contusion 01/27/2011  . Right shoulder pain 12/28/2010  . Insomnia 12/28/2010  . FOOT PAIN, LEFT 08/04/2010  . TRIGGER FINGER 06/03/2010  . Hyperlipidemia 03/17/2008  . DEGENERATIVE JOINT DISEASE, FINGERS 01/30/2008  . Hypertension 04/24/2007  . ANXIETY DISORDER 06/10/2006  . Obsessive-compulsive disorder 06/10/2006  . MITRAL VALVE PROLAPSE 06/10/2006  . SINUSITIS, CHRONIC 06/10/2006  . Allergic rhinitis 06/10/2006  . Ulcerative colitis 06/10/2006  . CARPAL TUNNEL RELEASE, HX OF 06/10/2006    HARRIS,KAREN 11/03/2014, 6:04 PM  [facility] [address] [place], [place], [address] Phone: [phone]   Fax:  [phone]  Melvenia Needles, PTA 11/03/2014 6:04 PM Phone: [phone] Fax: [phone]

## 2014-11-04 ENCOUNTER — Encounter: Payer: Self-pay | Admitting: Pulmonary Disease

## 2014-11-04 DIAGNOSIS — J309 Allergic rhinitis, unspecified: Secondary | ICD-10-CM

## 2014-11-05 ENCOUNTER — Ambulatory Visit: Payer: 59

## 2014-11-05 DIAGNOSIS — M545 Low back pain: Secondary | ICD-10-CM

## 2014-11-05 DIAGNOSIS — M5442 Lumbago with sciatica, left side: Secondary | ICD-10-CM | POA: Diagnosis not present

## 2014-11-05 NOTE — Therapy (Signed)
[place], [place], [address] Phone: [phone]   Fax:  [phone]  Physical Therapy Treatment  Patient Details  Name: Christina Burton MRN: [id number] Date of Birth: 1955-09-25 Referring Provider:  Oval Linsey, MD  Encounter Date: 11/05/2014      PT End of Session - 11/05/14 1625    Visit Number 8   Number of Visits 12   Date for PT Re-Evaluation 11/10/14   PT Start Time 0345   PT Stop Time 0444   PT Time Calculation (min) 59 min   Activity Tolerance Patient tolerated treatment well   Behavior During Therapy [facility] for tasks assessed/performed      Past Medical History  Diagnosis Date  . Sinusitis     chronic  . Mitral valve prolapse   . Ulcerative colitis     sees Dr. Collene Mares - normal colonoscopy in Nov 2011  . Anxiety disorder   . Obsessive compulsive disorder   . Allergic rhinitis     Past Surgical History  Procedure Laterality Date  . Carpal tunnel release    . Hernia repair    . Trigger finger release      right thumb  . Tonsilectomy, adenoidectomy, bilateral myringotomy and tubes      There were no vitals filed for this visit.  Visit Diagnosis:  Left low back pain, with sciatica presence unspecified      Subjective Assessment - 11/05/14 1551    Subjective LT side painful with sitting and no shooting pain now.   LAst shooting pain in a months. Sits with better posture and aware of not bend and twist.    Currently in Pain? Yes   Pain Score 2    Pain Descriptors / Indicators Aching   Multiple Pain Sites No            [facility] PT Assessment - 11/05/14 0001    Palpation   Palpation Tender mostly in LT gluteal  but also tender posterior LT thigh and LT lower back . Mild tender on RT.         Sacrum an dpelvis appear level                    [facility] Adult PT Treatment/Exercise - 11/05/14 0001    Lumbar Exercises: Supine   Bridge 10 reps;5 seconds   Bridge  Limitations done with initial pelvic tilt and cues to segment motion   Other Supine Lumbar Exercises posterior pelvic tilt x12 with tactile cues   Lumbar Exercises: Prone   Straight Leg Raise 10 reps  RT and LT   Other Prone Lumbar Exercises on elbows x30 sec, and with knee flexion x10 RT and LT, unable to pressup due to arm issues   Knee/Hip Exercises: Seated   Other Seated Knee Exercises edge of mat with hip lift with elbows on thighs with sidesteps RT and LT x    Knee/Hip Exercises: Sidelying   Hip ABduction AROM;Left;1 set;10 reps   Clams 12   cued for initial alignment   Other Sidelying Knee Exercises [place] x2 for LT hip piriformis 30 sec x 2   Moist Heat Therapy   Number Minutes Moist Heat 15 Minutes   Moist Heat Location --  back                  PT Short Term Goals - 11/03/14 1801    PT SHORT TERM GOAL #3   Title she will  report able to sit for 60 min with minimal discomfort   Baseline cannot  sit 60 minutes.   Time 3   Period Weeks   Status On-going           PT Long Term Goals - 11/03/14 1802    PT LONG TERM GOAL #1   Title She will be independnet with all HEP issued as of last visit   Time 6   Period Weeks   Status On-going   PT LONG TERM GOAL #2   Title She will report no leg symptoms and minor back pain with sitting at computer.    Baseline numbness, less leg pain (not mentioned today by patient)   Time 6   Period Weeks   Status On-going               Plan - 11/05/14 3086    PT Next Visit Plan continue stretgthening, HMP, ? STW to LT gluteals   PT Home Exercise Plan review HEP   Consulted and Agree with Plan of Care Patient        Problem List Patient Active Problem List   Diagnosis Date Noted  . Sciatica 06/18/2014  . GERD (gastroesophageal reflux disease) 12/31/2012  . Toe pain, right 5th toe 07/18/2012  . Breast cancer screening 11/10/2011  . Rotator cuff tear, right 11/08/2011  . Rotator cuff tendonitis 05/26/2011   . Biceps tendon rupture, proximal 01/27/2011  . Hand contusion 01/27/2011  . Right shoulder pain 12/28/2010  . Insomnia 12/28/2010  . FOOT PAIN, LEFT 08/04/2010  . TRIGGER FINGER 06/03/2010  . Hyperlipidemia 03/17/2008  . DEGENERATIVE JOINT DISEASE, FINGERS 01/30/2008  . Hypertension 04/24/2007  . ANXIETY DISORDER 06/10/2006  . Obsessive-compulsive disorder 06/10/2006  . MITRAL VALVE PROLAPSE 06/10/2006  . SINUSITIS, CHRONIC 06/10/2006  . Allergic rhinitis 06/10/2006  . Ulcerative colitis 06/10/2006  . CARPAL TUNNEL RELEASE, HX OF 06/10/2006    Darrel Hoover PT 11/05/2014, 4:30 PM  [place] [facility] [address] [place], [place], [address] Phone: [phone]   Fax:  [phone]

## 2014-11-09 ENCOUNTER — Encounter: Payer: Self-pay | Admitting: Pulmonary Disease

## 2014-11-10 ENCOUNTER — Ambulatory Visit: Payer: 59 | Admitting: Physical Therapy

## 2014-11-10 DIAGNOSIS — M5442 Lumbago with sciatica, left side: Secondary | ICD-10-CM | POA: Diagnosis not present

## 2014-11-10 DIAGNOSIS — M545 Low back pain: Secondary | ICD-10-CM

## 2014-11-10 NOTE — Therapy (Signed)
[place], [place], [address] Phone: [phone]   Fax:  [phone]  Physical Therapy Treatment  Patient Details  Name: Christina Burton MRN: [id number] Date of Birth: [birth year] Referring Provider:  Milagros Loll, MD  Encounter Date: 11/10/2014      PT End of Session - 11/10/14 1737    Visit Number 9   Number of Visits 12   Date for PT Re-Evaluation 11/10/14   PT Start Time 2671   PT Stop Time 2458   PT Time Calculation (min) 58 min   Activity Tolerance Patient tolerated treatment well      Past Medical History  Diagnosis Date  . Sinusitis     chronic  . Mitral valve prolapse   . Ulcerative colitis     sees Dr. Collene Mares - normal colonoscopy in Nov 2011  . Anxiety disorder   . Obsessive compulsive disorder   . Allergic rhinitis     Past Surgical History  Procedure Laterality Date  . Carpal tunnel release    . Hernia repair    . Trigger finger release      right thumb  . Tonsilectomy, adenoidectomy, bilateral myringotomy and tubes      There were no vitals filed for this visit.  Visit Diagnosis:  Left low back pain, with sciatica presence unspecified      Subjective Assessment - 11/10/14 1550    Subjective Patient is trying housework with good posture.  Can't always .  Some exercises done at home Piriformis stretch, sidelying clams.  Need more work on isolating.                       [facility] Adult PT Treatment/Exercise - 11/10/14 1605    Posture/Postural Control   Posture Comments practiced floor transfers, lifting.,practice.   Lumbar Exercises: Stretches   Piriformis Stretch 3 reps;30 seconds   Knee/Hip Exercises: Sidelying   Clams 10  cues given, explained work should be felt hip vs knee   Modalities   Modalities Moist Heat   Moist Heat Therapy   Number Minutes Moist Heat 15 Minutes   Moist Heat Location --  Back   Manual Therapy   Manual Therapy --   Gluteal.  triggerpoint, stretching                  PT Short Term Goals - 11/10/14 1554    PT SHORT TERM GOAL #1   Title she will be independnet with inital HEp   Time 3   Period Weeks   Status Achieved   PT SHORT TERM GOAL #2   Title She will report pain decreased 30-40%   Status Achieved   PT SHORT TERM GOAL #3   Title she will report able to sit for 60 min with minimal discomfort   Baseline 1 hour   Time 3   Period Weeks   Status Achieved           PT Long Term Goals - 11/10/14 1555    PT LONG TERM GOAL #1   Title She will be independnet with all HEP issued as of last visit   Time 6   Period Weeks   Status On-going   PT LONG TERM GOAL #2   Title She will report no leg symptoms and minor back pain with sitting at computer.    Baseline only has leg symptoms if she crosses her legs.   Time 6  Period Weeks   Status On-going               Plan - 11/10/14 1738    Clinical Impression Statement Plan of care up today.  Patient requests more PT.  She is making progress toward her goals.  Met her sitting goal today.   PT Next Visit Plan ERO, FOTO, Assess Soft tissue work.        Problem List Patient Active Problem List   Diagnosis Date Noted  . Sciatica 06/18/2014  . GERD (gastroesophageal reflux disease) 12/31/2012  . Toe pain, right 5th toe 07/18/2012  . Breast cancer screening 11/10/2011  . Rotator cuff tear, right 11/08/2011  . Rotator cuff tendonitis 05/26/2011  . Biceps tendon rupture, proximal 01/27/2011  . Hand contusion 01/27/2011  . Right shoulder pain 12/28/2010  . Insomnia 12/28/2010  . FOOT PAIN, LEFT 08/04/2010  . TRIGGER FINGER 06/03/2010  . Hyperlipidemia 03/17/2008  . DEGENERATIVE JOINT DISEASE, FINGERS 01/30/2008  . Hypertension 04/24/2007  . ANXIETY DISORDER 06/10/2006  . Obsessive-compulsive disorder 06/10/2006  . MITRAL VALVE PROLAPSE 06/10/2006  . SINUSITIS, CHRONIC 06/10/2006  . Allergic rhinitis 06/10/2006  .  Ulcerative colitis 06/10/2006  . CARPAL TUNNEL RELEASE, HX OF 06/10/2006  Melvenia Needles, PTA 11/10/2014 5:44 PM Phone: [phone] Fax: [phone]   [facility] 11/10/2014, 5:44 PM  [facility] [address] [place], [place], [address] Phone: [phone]   Fax:  [phone]

## 2014-11-12 ENCOUNTER — Ambulatory Visit: Payer: 59 | Admitting: Physical Therapy

## 2014-11-12 DIAGNOSIS — R293 Abnormal posture: Secondary | ICD-10-CM

## 2014-11-12 DIAGNOSIS — M5442 Lumbago with sciatica, left side: Secondary | ICD-10-CM | POA: Diagnosis not present

## 2014-11-12 DIAGNOSIS — M545 Low back pain: Secondary | ICD-10-CM

## 2014-11-12 DIAGNOSIS — M5432 Sciatica, left side: Secondary | ICD-10-CM

## 2014-11-12 NOTE — Therapy (Signed)
[place], [place], [address] Phone: [phone]   Fax:  [phone]  Physical Therapy Treatment  Patient Details  Name: Christina Burton MRN: [id number] Date of Birth: Feb 11, 1956 Referring Provider:  Milagros Loll, MD  Encounter Date: 11/12/2014      PT End of Session - 11/12/14 1716    Visit Number 10   Number of Visits 20   Date for PT Re-Evaluation 12/17/14   PT Start Time 1630   Activity Tolerance Patient tolerated treatment well   Behavior During Therapy [facility] for tasks assessed/performed      Past Medical History  Diagnosis Date  . Sinusitis     chronic  . Mitral valve prolapse   . Ulcerative colitis     sees Dr. Collene Mares - normal colonoscopy in Nov 2011  . Anxiety disorder   . Obsessive compulsive disorder   . Allergic rhinitis     Past Surgical History  Procedure Laterality Date  . Carpal tunnel release    . Hernia repair    . Trigger finger release      right thumb  . Tonsilectomy, adenoidectomy, bilateral myringotomy and tubes      There were no vitals filed for this visit.  Visit Diagnosis:  Left low back pain, with sciatica presence unspecified - Plan: PT plan of care cert/re-cert  Abnormal posture - Plan: PT plan of care cert/re-cert  Sciatica, left - Plan: PT plan of care cert/re-cert      Subjective Assessment - 11/12/14 1631    Subjective " things have been going better, I have been able to sit longer with my legs crossed."  She reports that she has been doing her HEP. She reports that working on going up and down steps.    Currently in Pain? Yes   Pain Score 1    Pain Location Hip  and low back   Pain Orientation Left   Pain Descriptors / Indicators Aching   Pain Type Chronic pain   Pain Onset More than a month ago   Pain Frequency Intermittent            [facility] PT Assessment - 11/12/14 1639    Observation/Other Assessments   Focus on Therapeutic Outcomes  (FOTO)  53% limitation   AROM   Lumbar Flexion 101 degrees  with no pain   Lumbar Extension 28 degrees   Lumbar - Right Side Bend 36 degrees   Lumbar - Left Side Bend 36 degrees   Lumbar - Right Rotation 50%   Lumbar - Left Rotation 50%   Strength   Overall Strength Comments LE WNL                   [facility] Adult PT Treatment/Exercise - 11/12/14 1653    Lumbar Exercises: Stretches   Active Hamstring Stretch 2 reps;30 seconds   Piriformis Stretch 3 reps;30 seconds   Lumbar Exercises: Supine   Bridge 10 reps;5 seconds   Other Supine Lumbar Exercises posterior pelvic tilt x12 with tactile cues   Other Supine Lumbar Exercises supine marching 2 x 10 with abdominal draw in manuver   Moist Heat Therapy   Number Minutes Moist Heat 15 Minutes   Moist Heat Location --  low back in supine   Manual Therapy   Manual Therapy Massage   Massage STM/DTM of glute max/med and piriformis   with tennis ball  PT Education - 11/12/14 1715    Education Details continued POC, reviewed HEP and postural education   Person(s) Educated Patient   Methods Explanation   Comprehension Verbalized understanding          PT Short Term Goals - 11/10/14 1554    PT SHORT TERM GOAL #1   Title she will be independnet with inital HEp   Time 3   Period Weeks   Status Achieved   PT SHORT TERM GOAL #2   Title She will report pain decreased 30-40%   Status Achieved   PT SHORT TERM GOAL #3   Title she will report able to sit for 60 min with minimal discomfort   Baseline 1 hour   Time 3   Period Weeks   Status Achieved           PT Long Term Goals - 11/12/14 1725    PT LONG TERM GOAL #1   Title She will be independnet with all HEP issued as of last visit (12/10/2014)   Time 4   Period Weeks   Status On-going   PT LONG TERM GOAL #2   Title She will report no leg symptoms and minor back pain with sitting at computer. (12/10/2014)   Time 5   Period Weeks   Status  Partially Met   PT LONG TERM GOAL #3   Title she will be able to navigate 15 steps with <3/10 pain to help with community ambulation (12/10/2014)   Time 4   Period Weeks   Status New               Plan - 11/12/14 1717    Clinical Impression Statement Wm continues to make progress with functional trunk mobility with decreased pain rated at a 1.5/10 and increased activitiy and maintaining static positions such as crossing her legs before she gets symptoms. She partially met Long term goal #2. She reports continued tenderness located at the L upper glute max/medius with intermittent referral to the posterior aspect of the L calf. pt's FOTO score dropped 8 points but this could be largely attributed to over anlaysis of the quesitons given.  She would benefit from continued physical therapy and continue with POC.    Pt will benefit from skilled therapeutic intervention in order to improve on the following deficits Decreased range of motion;Pain;Increased muscle spasms;Decreased activity tolerance;Postural dysfunction   Rehab Potential Good   PT Frequency 2x / week   PT Duration 4 weeks   PT Treatment/Interventions Moist Heat;Electrical Stimulation;Patient/family education;Therapeutic activities;Therapeutic exercise;Dry needling;Manual techniques;Neuromuscular re-education;Balance training;Cryotherapy;ADLs/Self Care Home Management   PT Next Visit Plan Assess STM/DTM of piriformis, Continue strengthening of hip musculature, begin working ascending/ descending stairs, and balance training.    PT Home Exercise Plan reviewed HEP   Consulted and Agree with Plan of Care Patient        Problem List Patient Active Problem List   Diagnosis Date Noted  . Sciatica 06/18/2014  . GERD (gastroesophageal reflux disease) 12/31/2012  . Toe pain, right 5th toe 07/18/2012  . Breast cancer screening 11/10/2011  . Rotator cuff tear, right 11/08/2011  . Rotator cuff tendonitis 05/26/2011  . Biceps  tendon rupture, proximal 01/27/2011  . Hand contusion 01/27/2011  . Right shoulder pain 12/28/2010  . Insomnia 12/28/2010  . FOOT PAIN, LEFT 08/04/2010  . TRIGGER FINGER 06/03/2010  . Hyperlipidemia 03/17/2008  . DEGENERATIVE JOINT DISEASE, FINGERS 01/30/2008  . Hypertension 04/24/2007  . ANXIETY DISORDER 06/10/2006  . Obsessive-compulsive  disorder 06/10/2006  . MITRAL VALVE PROLAPSE 06/10/2006  . SINUSITIS, CHRONIC 06/10/2006  . Allergic rhinitis 06/10/2006  . Ulcerative colitis 06/10/2006  . CARPAL TUNNEL RELEASE, HX OF 06/10/2006   Starr Lake PT, DPT, LAT, ATC  11/12/2014  5:40 PM   [place] [facility] [address], [place], [address] Phone: [phone]   Fax:  [phone]

## 2014-11-13 ENCOUNTER — Encounter: Payer: 59 | Admitting: Physical Therapy

## 2014-11-17 ENCOUNTER — Encounter: Payer: Self-pay | Admitting: Pulmonary Disease

## 2014-11-17 DIAGNOSIS — J309 Allergic rhinitis, unspecified: Secondary | ICD-10-CM

## 2014-11-25 ENCOUNTER — Ambulatory Visit: Payer: 59 | Admitting: Physical Therapy

## 2014-11-27 ENCOUNTER — Ambulatory Visit: Payer: 59 | Admitting: Physical Therapy

## 2014-11-27 DIAGNOSIS — M5442 Lumbago with sciatica, left side: Secondary | ICD-10-CM | POA: Diagnosis not present

## 2014-11-27 DIAGNOSIS — M5432 Sciatica, left side: Secondary | ICD-10-CM

## 2014-11-27 DIAGNOSIS — R293 Abnormal posture: Secondary | ICD-10-CM

## 2014-11-27 DIAGNOSIS — M545 Low back pain: Secondary | ICD-10-CM

## 2014-11-27 NOTE — Therapy (Signed)
[place], [place], [address] Phone: [phone]   Fax:  [phone]  Physical Therapy Treatment  Patient Details  Name: Christina Burton MRN: [id number] Date of Birth: 1956/02/07 Referring Provider:  Milagros Loll, MD  Encounter Date: 11/27/2014      PT End of Session - 11/27/14 1754    Visit Number 11   Number of Visits 20   Date for PT Re-Evaluation 12/17/14   PT Start Time 1503   PT Stop Time 1547   PT Time Calculation (min) 44 min   Activity Tolerance Patient tolerated treatment well      Past Medical History  Diagnosis Date  . Sinusitis     chronic  . Mitral valve prolapse   . Ulcerative colitis     sees Dr. Collene Mares - normal colonoscopy in Nov 2011  . Anxiety disorder   . Obsessive compulsive disorder   . Allergic rhinitis     Past Surgical History  Procedure Laterality Date  . Carpal tunnel release    . Hernia repair    . Trigger finger release      right thumb  . Tonsilectomy, adenoidectomy, bilateral myringotomy and tubes      There were no vitals filed for this visit.  Visit Diagnosis:  Left low back pain, with sciatica presence unspecified  Abnormal posture  Sciatica, left      Subjective Assessment - 11/27/14 1517    Subjective exercises 15-20 minutes a day.  No pain now.  Sitting legs crossed painful.  Went up stairs legs felt heavy vs pain in hips pain in Rt knee only.   Currently in Pain? No/denies   Pain Location Hip   Pain Orientation Left   Pain Descriptors / Indicators Aching  goes to sleep   Pain Frequency Intermittent   Aggravating Factors  sitting, crossing legs   Pain Relieving Factors heated rice   Effect of Pain on Daily Activities sitting limited                         [facility] Adult PT Treatment/Exercise - 11/27/14 1522    Lumbar Exercises: Stretches   Piriformis Stretch 3 reps;30 seconds  needs cues to hold longer   Lumbar  Exercises: Aerobic   Stationary Bike Nu step, 7 minutes Level 5   Lumbar Exercises: Supine   Bridge 10 reps  3-5 second hold   Other Supine Lumbar Exercises isometric hip abduction into belt, 5 second holds, 20 reps from hooklying   Lumbar Exercises: Sidelying   Clam 10 reps  2 LBS each, good technique   Knee/Hip Exercises: Prone   Straight Leg Raises Limitations 10  each   Manual Therapy   Manual Therapy --  tennis ball trigger point Lt gluteal                  PT Short Term Goals - 11/10/14 1554    PT SHORT TERM GOAL #1   Title she will be independnet with inital HEp   Time 3   Period Weeks   Status Achieved   PT SHORT TERM GOAL #2   Title She will report pain decreased 30-40%   Status Achieved   PT SHORT TERM GOAL #3   Title she will report able to sit for 60 min with minimal discomfort   Baseline 1 hour   Time 3   Period Weeks   Status Achieved  PT Long Term Goals - 11/27/14 1759    PT LONG TERM GOAL #1   Title She will be independnet with all HEP issued as of last visit (12/10/2014)   Time 4   Period Weeks   Status On-going   PT LONG TERM GOAL #2   Title She will report no leg symptoms and minor back pain with sitting at computer. (12/10/2014)   Baseline 5   Period Weeks   Status Partially Met   PT LONG TERM GOAL #3   Title she will be able to navigate 15 steps with <3/10 pain to help with community ambulation (12/10/2014)   Baseline no pain just fatigue   Time 4   Period Weeks   Status Achieved               Plan - 11/27/14 1755    Clinical Impression Statement Pain improving, still limited with sitting , crossing legs, more adherent with home exercise program exercises 15-20 minutes daily, Able to use steps today in community without pain, just fatigue.  Progress toward those goals.   PT Next Visit Plan , Continue strengthening of hip musculature, begin working ascending/ descending stairs, and balance training. Trigger point  to piriformis helpful.   Consulted and Agree with Plan of Care Patient        Problem List Patient Active Problem List   Diagnosis Date Noted  . Sciatica 06/18/2014  . GERD (gastroesophageal reflux disease) 12/31/2012  . Toe pain, right 5th toe 07/18/2012  . Breast cancer screening 11/10/2011  . Rotator cuff tear, right 11/08/2011  . Rotator cuff tendonitis 05/26/2011  . Biceps tendon rupture, proximal 01/27/2011  . Hand contusion 01/27/2011  . Right shoulder pain 12/28/2010  . Insomnia 12/28/2010  . FOOT PAIN, LEFT 08/04/2010  . TRIGGER FINGER 06/03/2010  . Hyperlipidemia 03/17/2008  . DEGENERATIVE JOINT DISEASE, FINGERS 01/30/2008  . Hypertension 04/24/2007  . ANXIETY DISORDER 06/10/2006  . Obsessive-compulsive disorder 06/10/2006  . MITRAL VALVE PROLAPSE 06/10/2006  . SINUSITIS, CHRONIC 06/10/2006  . Allergic rhinitis 06/10/2006  . Ulcerative colitis 06/10/2006  . CARPAL TUNNEL RELEASE, HX OF 06/10/2006   Melvenia Needles, PTA 11/27/2014 6:02 PM Phone: [phone] Fax: [phone]  Melvenia Needles 11/27/2014, 6:02 PM  [facility] [address] [place], [place], [address] Phone: [phone]   Fax:  [phone]

## 2014-12-01 ENCOUNTER — Ambulatory Visit (INDEPENDENT_AMBULATORY_CARE_PROVIDER_SITE_OTHER): Payer: 59 | Admitting: Pulmonary Disease

## 2014-12-01 VITALS — BP 114/73 | HR 92 | Temp 98.4°F | Wt 143.4 lb

## 2014-12-01 DIAGNOSIS — I10 Essential (primary) hypertension: Secondary | ICD-10-CM

## 2014-12-01 DIAGNOSIS — X58XXXD Exposure to other specified factors, subsequent encounter: Secondary | ICD-10-CM | POA: Diagnosis not present

## 2014-12-01 DIAGNOSIS — S46119D Strain of muscle, fascia and tendon of long head of biceps, unspecified arm, subsequent encounter: Secondary | ICD-10-CM

## 2014-12-01 DIAGNOSIS — K51919 Ulcerative colitis, unspecified with unspecified complications: Secondary | ICD-10-CM

## 2014-12-01 DIAGNOSIS — Z0000 Encounter for general adult medical examination without abnormal findings: Secondary | ICD-10-CM

## 2014-12-01 DIAGNOSIS — K5190 Ulcerative colitis, unspecified, without complications: Secondary | ICD-10-CM

## 2014-12-01 DIAGNOSIS — Z23 Encounter for immunization: Secondary | ICD-10-CM

## 2014-12-01 DIAGNOSIS — F429 Obsessive-compulsive disorder, unspecified: Secondary | ICD-10-CM

## 2014-12-01 DIAGNOSIS — S46211D Strain of muscle, fascia and tendon of other parts of biceps, right arm, subsequent encounter: Secondary | ICD-10-CM

## 2014-12-01 LAB — BASIC METABOLIC PANEL WITH GFR
BUN: 17 mg/dL (ref 6–23)
CHLORIDE: 100 meq/L (ref 96–112)
CO2: 28 meq/L (ref 19–32)
CREATININE: 0.83 mg/dL (ref 0.50–1.10)
Calcium: 9.4 mg/dL (ref 8.4–10.5)
GFR, Est Non African American: 79 mL/min
GLUCOSE: 93 mg/dL (ref 70–99)
Potassium: 3.3 mEq/L — ABNORMAL LOW (ref 3.5–5.3)
Sodium: 141 mEq/L (ref 135–145)

## 2014-12-01 MED ORDER — PAROXETINE HCL 20 MG PO TABS: 40.0000 mg | ORAL_TABLET | Freq: Every day | ORAL | Status: DC

## 2014-12-02 ENCOUNTER — Ambulatory Visit: Payer: 59 | Attending: Internal Medicine | Admitting: Physical Therapy

## 2014-12-02 ENCOUNTER — Encounter: Payer: Self-pay | Admitting: Pulmonary Disease

## 2014-12-02 ENCOUNTER — Other Ambulatory Visit: Payer: Self-pay | Admitting: Pulmonary Disease

## 2014-12-02 DIAGNOSIS — M545 Low back pain: Secondary | ICD-10-CM

## 2014-12-02 DIAGNOSIS — M5432 Sciatica, left side: Secondary | ICD-10-CM

## 2014-12-02 DIAGNOSIS — I341 Nonrheumatic mitral (valve) prolapse: Secondary | ICD-10-CM | POA: Diagnosis not present

## 2014-12-02 DIAGNOSIS — K219 Gastro-esophageal reflux disease without esophagitis: Secondary | ICD-10-CM | POA: Diagnosis not present

## 2014-12-02 DIAGNOSIS — K5190 Ulcerative colitis, unspecified, without complications: Secondary | ICD-10-CM | POA: Diagnosis not present

## 2014-12-02 DIAGNOSIS — I10 Essential (primary) hypertension: Secondary | ICD-10-CM | POA: Diagnosis not present

## 2014-12-02 DIAGNOSIS — G4700 Insomnia, unspecified: Secondary | ICD-10-CM | POA: Diagnosis not present

## 2014-12-02 DIAGNOSIS — M19049 Primary osteoarthritis, unspecified hand: Secondary | ICD-10-CM | POA: Insufficient documentation

## 2014-12-02 DIAGNOSIS — R293 Abnormal posture: Secondary | ICD-10-CM

## 2014-12-02 DIAGNOSIS — M5442 Lumbago with sciatica, left side: Secondary | ICD-10-CM | POA: Insufficient documentation

## 2014-12-02 MED ORDER — POTASSIUM CHLORIDE ER 10 MEQ PO TBCR: 30.0000 meq | EXTENDED_RELEASE_TABLET | Freq: Every day | ORAL | Status: DC

## 2014-12-02 NOTE — Assessment & Plan Note (Signed)
Referral placed for follow up with Dr. Collene Mares

## 2014-12-02 NOTE — Assessment & Plan Note (Signed)
Referral placed for follow up with Dr. Amedeo Plenty

## 2014-12-02 NOTE — Therapy (Signed)
[place], [place], [address] Phone: [phone]   Fax:  [phone]  Physical Therapy Treatment  Patient Details  Name: Christina Burton MRN: [id number] Date of Birth: 15-Oct-1955 Referring Provider:  Milagros Loll, MD  Encounter Date: 12/02/2014      PT End of Session - 12/02/14 1620    Visit Number 12   Number of Visits 20   Date for PT Re-Evaluation 12/17/14   PT Start Time 8110   PT Stop Time 1620   PT Time Calculation (min) 50 min   Activity Tolerance Patient tolerated treatment well   Behavior During Therapy [facility] for tasks assessed/performed      Past Medical History  Diagnosis Date  . Sinusitis     chronic  . Mitral valve prolapse   . Ulcerative colitis     sees Dr. Collene Mares - normal colonoscopy in Nov 2011  . Anxiety disorder   . Obsessive compulsive disorder   . Allergic rhinitis     Past Surgical History  Procedure Laterality Date  . Carpal tunnel release    . Hernia repair    . Trigger finger release      right thumb  . Tonsillectomy and adenoidectomy      There were no vitals filed for this visit.  Visit Diagnosis:  Left low back pain, with sciatica presence unspecified  Abnormal posture  Sciatica, left      Subjective Assessment - 12/02/14 1531    Subjective pt presents to day stating that she is doing better today and has continued to practice walking up and down the steps.    Currently in Pain? Yes   Pain Score 0-No pain  sitting for a little bit 2-3/10   Pain Location Hip                         [facility] Adult PT Treatment/Exercise - 12/02/14 0001    Ambulation/Gait   Stairs Yes   Stairs Assistance 7: Independent   Stair Management Technique Alternating pattern   Number of Stairs --  24 @ 4 inch, and 20 at 6 inch   Lumbar Exercises: Stretches   Passive Hamstring Stretch 2 reps;30 seconds  with strap   Single Knee to Chest Stretch 2 reps;30  seconds  requires cueing for proper form   Lower Trunk Rotation 2 reps;30 seconds   Piriformis Stretch 30 seconds;4 reps   Lumbar Exercises: Aerobic   Stationary Bike L2 x 7 min   Lumbar Exercises: Standing   Side Lunge 10 reps;1 second   Wall Slides 15 reps   Other Standing Lumbar Exercises wall sits 2 x 20    on second set she reported increased fatigue   Lumbar Exercises: Seated   Sit to Stand 20 reps  with focus on eccentric control, keeping feet on the floor   Lumbar Exercises: Supine   Bridge 10 reps  with abdominal draw in manuever   Lumbar Exercises: Sidelying   Clam 10 reps  2 reps with 3#   Knee/Hip Exercises: Standing   Lateral Step Up 2 sets;Step Height: 6";Step Height: 8";10 reps  with 6 # to mimic weight of purse and other bags                PT Education - 12/02/14 1620    Education provided Yes   Education Details added standing glute and hamstring stretch   Person(s) Educated Patient  Methods Explanation   Comprehension Verbalized understanding          PT Short Term Goals - 11/10/14 1554    PT SHORT TERM GOAL #1   Title she will be independnet with inital HEp   Time 3   Period Weeks   Status Achieved   PT SHORT TERM GOAL #2   Title She will report pain decreased 30-40%   Status Achieved   PT SHORT TERM GOAL #3   Title she will report able to sit for 60 min with minimal discomfort   Baseline 1 hour   Time 3   Period Weeks   Status Achieved           PT Long Term Goals - 11/27/14 1759    PT LONG TERM GOAL #1   Title She will be independnet with all HEP issued as of last visit (12/10/2014)   Time 4   Period Weeks   Status On-going   PT LONG TERM GOAL #2   Title She will report no leg symptoms and minor back pain with sitting at computer. (12/10/2014)   Baseline 5   Period Weeks   Status Partially Met   PT LONG TERM GOAL #3   Title she will be able to navigate 15 steps with <3/10 pain to help with community ambulation  (12/10/2014)   Baseline no pain just fatigue   Time 4   Period Weeks   Status Achieved               Plan - 12/02/14 1625    Clinical Impression Statement Naysa continues to make progress with improved function and decreased pain. She tolerated navigating steps without pain and step ups with 6 # (to mimic carrying her purse and a bag). Added standing stretchs today to progress her toward continued stretching trhoughout the day following activities.    PT Next Visit Plan , Continue strengthening of hip musculature, begin working ascending/ descending stairs, and balance training. Trigger point to piriformis helpful.   PT Home Exercise Plan standing hamstring and glute stretches   Consulted and Agree with Plan of Care Patient        Problem List Patient Active Problem List   Diagnosis Date Noted  . Sciatica 06/18/2014  . GERD (gastroesophageal reflux disease) 12/31/2012  . Toe pain, right 5th toe 07/18/2012  . Breast cancer screening 11/10/2011  . Rotator cuff tear, right 11/08/2011  . Rotator cuff tendonitis 05/26/2011  . Biceps tendon rupture, proximal 01/27/2011  . Hand contusion 01/27/2011  . Right shoulder pain 12/28/2010  . Insomnia 12/28/2010  . FOOT PAIN, LEFT 08/04/2010  . TRIGGER FINGER 06/03/2010  . Hyperlipidemia 03/17/2008  . DEGENERATIVE JOINT DISEASE, FINGERS 01/30/2008  . Hypertension 04/24/2007  . ANXIETY DISORDER 06/10/2006  . Obsessive-compulsive disorder 06/10/2006  . MITRAL VALVE PROLAPSE 06/10/2006  . SINUSITIS, CHRONIC 06/10/2006  . Allergic rhinitis 06/10/2006  . Ulcerative colitis 06/10/2006  . CARPAL TUNNEL RELEASE, HX OF 06/10/2006   Starr Lake PT, DPT, LAT, ATC  12/02/2014  4:29 PM   [place] [facility] [address] [place], [place], [address] Phone: [phone]   Fax:  [phone]

## 2014-12-02 NOTE — Progress Notes (Signed)
INTERNAL MEDICINE TEACHING ATTENDING ADDENDUM - Aldine Contes, MD: I reviewed and discussed at the time of visit with the resident Dr. Randell Patient, the patient's medical history, physical examination, diagnosis and results of pertinent tests and treatment and I agree with the patient's care as documented.

## 2014-12-02 NOTE — Patient Instructions (Signed)
   Kristoffer Leamon PT, DPT, LAT, ATC  [facility] Phone: [phone]     

## 2014-12-02 NOTE — Progress Notes (Signed)
Subjective:   Patient ID: Christina Burton, female    DOB: [birth year], 59 y.o.   MRN: [id number]  HPI Ms. Christina Burton is a 59 year old woman with history of HTN, HLD, and ulcerative colitis presenting for follow up.  She recently changed insurance programs and needs referrals to specialists she has seen before. She reports having bicep rupture that she follows with Dr. Amedeo Plenty of [facility]. She sees Dr. Collene Mares for her ulcerative colitis. She sees Dr. Ubaldo Glassing for annual skin checks. She goes to [facility] for annual gyn exams.  She reports she had a measurement of her BP at an office visit that was 295M-841L systolic. She has been measuring her blood pressure at home and it has been lower.  Review of Systems Constitutional: no fevers/chills Eyes: no vision changes Ears, nose, mouth, throat, and face: no cough Respiratory: no shortness of breath Cardiovascular: no chest pain Gastrointestinal: no nausea/vomiting, no abdominal pain, no constipation, no diarrhea Genitourinary: no dysuria, no hematuria Integument: no rash Hematologic/lymphatic: no bleeding/bruising, no edema Musculoskeletal: no arthralgias, no myalgias Neurological: no paresthesias, no weakness  Past Medical History  Diagnosis Date  . Sinusitis     chronic  . Mitral valve prolapse   . Ulcerative colitis     sees Dr. Collene Mares - normal colonoscopy in Nov 2011  . Anxiety disorder   . Obsessive compulsive disorder   . Allergic rhinitis     Current Outpatient Prescriptions on File Prior to Visit  Medication Sig Dispense Refill  . acetaminophen (TYLENOL) 500 MG tablet Take 500 mg by mouth every 6 (six) hours as needed. For pain    . albuterol (PROVENTIL,VENTOLIN) 90 MCG/ACT inhaler Inhale 2 puffs into the lungs every 6 (six) hours as needed. For shortness of breath or wheezing     . Ascorbic Acid (VITAMIN C PO) Take 1 tablet by mouth daily.    Marland Kitchen azelastine (ASTELIN) 137 MCG/SPRAY nasal spray 1 spray by  Nasal route daily. Use in each nostril as directed     . beclomethasone (QVAR) 80 MCG/ACT inhaler Inhale 2 puffs into the lungs daily.      . cetirizine (ZYRTEC) 10 MG tablet Take 10 mg by mouth daily.      Marland Kitchen gabapentin (NEURONTIN) 300 MG capsule Take 300 mg by mouth 3 (three) times daily as needed.    . hydrochlorothiazide (HYDRODIURIL) 25 MG tablet Take 1 tablet (25 mg total) by mouth daily. 90 tablet 4  . mesalamine (CANASA) 1000 MG suppository Place 1,000 mg rectally daily as needed. Use as directed by your gastroenterologist for ulcerative colitis    . mesalamine (LIALDA) 1.2 G EC tablet Take 2.4 g by mouth daily.     . mometasone (NASONEX) 50 MCG/ACT nasal spray 1 spray by Nasal route daily.      . montelukast (SINGULAIR) 10 MG tablet Take 10 mg by mouth daily as needed.     . Multiple Vitamin (MULTIVITAMIN WITH MINERALS) TABS Take 1 tablet by mouth daily.    Marland Kitchen omeprazole (PRILOSEC) 20 MG capsule Take 40 mg by mouth daily.     . potassium chloride (K-DUR) 10 MEQ tablet Take 2 tablets (20 mEq total) by mouth daily. 60 tablet 2  . pravastatin (PRAVACHOL) 20 MG tablet take 1 tablet by mouth every evening 90 tablet 4   No current facility-administered medications on file prior to visit.    Today's Vitals   12/01/14 1614  BP: 114/73  Pulse: 92  Temp: 98.4 F (  36.9 C)  TempSrc: Oral  Weight: 143 lb 6.4 oz (65.046 kg)  SpO2: 98%   Objective:  Physical Exam  Constitutional: She is oriented to person, place, and time. She appears well-developed and well-nourished.  HENT:  Head: Normocephalic and atraumatic.  Eyes: Conjunctivae and EOM are normal.  Neck: Neck supple.  Cardiovascular: Normal rate and regular rhythm.   Pulmonary/Chest: Effort normal. She has no wheezes.  Abdominal: Soft. She exhibits no distension.  Musculoskeletal: She exhibits no edema.  Neurological: She is alert and oriented to person, place, and time.  Skin: Skin is warm and dry.  Psychiatric: She has a normal  mood and affect.     Assessment & Plan:  Please refer to problem based charting.

## 2014-12-02 NOTE — Assessment & Plan Note (Addendum)
BP Readings from Last 3 Encounters:  12/01/14 114/73  08/07/14 103/76  06/18/14 135/77    Lab Results  Component Value Date   NA 141 12/01/2014   K 3.3* 12/01/2014   CREATININE 0.83 12/01/2014    Assessment: Blood pressure control: controlled Progress toward BP goal:  at goal  Plan: Medications:  continue current medications including HCTZ 18m daily BMP with Potassium 3.3. Increase KCl to 344m PO daily.

## 2014-12-02 NOTE — Addendum Note (Signed)
Addended by: Jacques Earthly T on: 12/02/2014 08:47 AM   Modules accepted: Orders

## 2014-12-04 ENCOUNTER — Ambulatory Visit: Payer: 59 | Admitting: Physical Therapy

## 2014-12-04 DIAGNOSIS — M5432 Sciatica, left side: Secondary | ICD-10-CM

## 2014-12-04 DIAGNOSIS — R293 Abnormal posture: Secondary | ICD-10-CM

## 2014-12-04 DIAGNOSIS — M545 Low back pain: Secondary | ICD-10-CM

## 2014-12-04 DIAGNOSIS — M5442 Lumbago with sciatica, left side: Secondary | ICD-10-CM | POA: Diagnosis not present

## 2014-12-04 NOTE — Therapy (Signed)
[place], [place], [address] Phone: [phone]   Fax:  [phone]  Physical Therapy Treatment  Patient Details  Name: Christina Burton MRN: [id number] Date of Birth: March 25, 1956 Referring Provider:  Milagros Loll, MD  Encounter Date: 12/04/2014      PT End of Session - 12/04/14 1627    Visit Number 13   Number of Visits 20   Date for PT Re-Evaluation 12/17/14   PT Start Time 5366   PT Stop Time 4403   PT Time Calculation (min) 65 min   Activity Tolerance Patient tolerated treatment well      Past Medical History  Diagnosis Date  . Sinusitis     chronic  . Mitral valve prolapse   . Ulcerative colitis     sees Dr. Collene Mares - normal colonoscopy in Nov 2011  . Anxiety disorder   . Obsessive compulsive disorder   . Allergic rhinitis     Past Surgical History  Procedure Laterality Date  . Carpal tunnel release    . Hernia repair    . Trigger finger release      right thumb  . Tonsillectomy and adenoidectomy      There were no vitals filed for this visit.  Visit Diagnosis:  Left low back pain, with sciatica presence unspecified  Abnormal posture  Sciatica, left      Subjective Assessment - 12/04/14 1537    Subjective Buttock aches vs sharp pain with sitting                         [facility] Adult PT Treatment/Exercise - 12/04/14 1547    Lumbar Exercises: Aerobic   Stationary Bike Nu strp 7 minutes L4   Lumbar Exercises: Standing   Wall Slides 10 reps  2 sets   Knee/Hip Exercises: Standing   Forward Step Up 10 reps;2 sets  cued to use gluteals more.   Knee/Hip Exercises: Supine   Bridges 10 reps;2 sets  legs over orange ball   Knee Flexion Limitations 10 reps 2 sets  feet on ball cued for abdominal control abdominal bracing.    Knee/Hip Exercises: Sidelying   Hip ABduction 10 reps   Knee/Hip Exercises: Prone   Straight Leg Raises Limitations 10    Modalities    Modalities Moist Heat   Moist Heat Therapy   Number Minutes Moist Heat 15 Minutes   Moist Heat Location --  Back                  PT Short Term Goals - 11/10/14 1554    PT SHORT TERM GOAL #1   Title she will be independnet with inital HEp   Time 3   Period Weeks   Status Achieved   PT SHORT TERM GOAL #2   Title She will report pain decreased 30-40%   Status Achieved   PT SHORT TERM GOAL #3   Title she will report able to sit for 60 min with minimal discomfort   Baseline 1 hour   Time 3   Period Weeks   Status Achieved           PT Long Term Goals - 12/04/14 1542    PT LONG TERM GOAL #1   Title She will be independnet with all HEP issued as of last visit (12/10/2014)   Time 4   Period Weeks   Status On-going   PT LONG TERM GOAL #2  Title She will report no leg symptoms and minor back pain with sitting at computer. (12/10/2014)   Baseline 1.5-2 hours upper thigh pain back pain a twinge   Time 5   Period Weeks   Status Partially Met   PT LONG TERM GOAL #3   Title she will be able to navigate 15 steps with <3/10 pain to help with community ambulation (12/10/2014)   Period Weeks   Status Achieved               Plan - 12/04/14 1628    Clinical Impression Statement 1/10 pain buttock during exercise that she did not report until the end of session,  See goals for progress,  sitting more comfortable.   PT Next Visit Plan , Continue strengthening of hip musculature, begin working ascending/ descending stairs, and balance training. Trigger point to piriformis helpful.   Consulted and Agree with Plan of Care Patient        Problem List Patient Active Problem List   Diagnosis Date Noted  . Sciatica 06/18/2014  . GERD (gastroesophageal reflux disease) 12/31/2012  . Toe pain, right 5th toe 07/18/2012  . Breast cancer screening 11/10/2011  . Rotator cuff tear, right 11/08/2011  . Rotator cuff tendonitis 05/26/2011  . Biceps tendon rupture, proximal  01/27/2011  . Hand contusion 01/27/2011  . Right shoulder pain 12/28/2010  . Insomnia 12/28/2010  . FOOT PAIN, LEFT 08/04/2010  . TRIGGER FINGER 06/03/2010  . Hyperlipidemia 03/17/2008  . DEGENERATIVE JOINT DISEASE, FINGERS 01/30/2008  . Hypertension 04/24/2007  . ANXIETY DISORDER 06/10/2006  . Obsessive-compulsive disorder 06/10/2006  . MITRAL VALVE PROLAPSE 06/10/2006  . SINUSITIS, CHRONIC 06/10/2006  . Allergic rhinitis 06/10/2006  . Ulcerative colitis 06/10/2006  . CARPAL TUNNEL RELEASE, HX OF 06/10/2006    Milus Fritze 12/04/2014, 4:30 PM  [facility] [address] [place], [place], [address] Phone: [phone]   Fax:  [phone]    Melvenia Needles, PTA 12/04/2014 4:30 PM Phone: [phone] Fax: [phone]

## 2014-12-08 ENCOUNTER — Ambulatory Visit: Payer: 59 | Admitting: Physical Therapy

## 2014-12-08 DIAGNOSIS — M5442 Lumbago with sciatica, left side: Secondary | ICD-10-CM | POA: Diagnosis not present

## 2014-12-08 DIAGNOSIS — M545 Low back pain: Secondary | ICD-10-CM

## 2014-12-08 DIAGNOSIS — R293 Abnormal posture: Secondary | ICD-10-CM

## 2014-12-08 NOTE — Therapy (Signed)
[place], [place], [address] Phone: [phone]   Fax:  [phone]  Physical Therapy Treatment  Patient Details  Name: Christina Burton MRN: [id number] Date of Birth: Nov 26, 1955 Referring Provider:  Milagros Loll, MD  Encounter Date: 12/08/2014      PT End of Session - 12/08/14 1640    Visit Number 14   Number of Visits 20   Date for PT Re-Evaluation 12/17/14   PT Start Time 6168   PT Stop Time 1648   PT Time Calculation (min) 61 min   Activity Tolerance Patient tolerated treatment well      Past Medical History  Diagnosis Date  . Sinusitis     chronic  . Mitral valve prolapse   . Ulcerative colitis     sees Dr. Collene Mares - normal colonoscopy in Nov 2011  . Anxiety disorder   . Obsessive compulsive disorder   . Allergic rhinitis     Past Surgical History  Procedure Laterality Date  . Carpal tunnel release    . Hernia repair    . Trigger finger release      right thumb  . Tonsillectomy and adenoidectomy      There were no vitals filed for this visit.  Visit Diagnosis:  Left low back pain, with sciatica presence unspecified  Abnormal posture      Subjective Assessment - 12/08/14 1603    Subjective Twinge LT buttock, not bad at all..  She does some exercises at home   Currently in Pain? Yes   Pain Score 1    Pain Location Buttocks   Pain Orientation Left   Pain Descriptors / Indicators --  twinge   Aggravating Factors  sitting too long ,    Pain Relieving Factors heated rice   Effect of Pain on Daily Activities avoids crossing legs.  can't sit  too long   Multiple Pain Sites No   Multiple Pain Sites No                         [facility] Adult PT Treatment/Exercise - 12/08/14 1607    Lumbar Exercises: Aerobic   Stationary Bike Nu step L4 7 minutes   Lumbar Exercises: Sidelying   Clam 10 reps  each   Knee/Hip Exercises: Supine   Bridges 10 reps  also with  clamshell cued needed.   Knee/Hip Exercises: Prone   Straight Leg Raises Limitations Multifituli press, press with knee flexion tactile with lots of cues.   Moist Heat Therapy   Number Minutes Moist Heat 15 Minutes   Moist Heat Location --  prone Back   Manual Therapy   Manual Therapy --  neuromuscular trigger point release with tennis ball Lt glut   Massage Multiple tender areas softened.                  PT Short Term Goals - 11/10/14 1554    PT SHORT TERM GOAL #1   Title she will be independnet with inital HEp   Time 3   Period Weeks   Status Achieved   PT SHORT TERM GOAL #2   Title She will report pain decreased 30-40%   Status Achieved   PT SHORT TERM GOAL #3   Title she will report able to sit for 60 min with minimal discomfort   Baseline 1 hour   Time 3   Period Weeks   Status Achieved  PT Long Term Goals - 12/08/14 1644    PT LONG TERM GOAL #1   Title She will be independnet with all HEP issued as of last visit (12/10/2014)   Time 4   Period Weeks   Status On-going   PT LONG TERM GOAL #2   Title She will report no leg symptoms and minor back pain with sitting at computer. (12/10/2014)   Time 5   Period Weeks   Status On-going   PT LONG TERM GOAL #3   Title she will be able to navigate 15 steps with <3/10 pain to help with community ambulation (12/10/2014)   Baseline no pain just fatigue   Time 4   Period Weeks   Status Achieved               Plan - 12/08/14 1642    Clinical Impression Statement No pain reported during session, focus on core strengthening, No new goals met.     PT Next Visit Plan Multifidus exercises for home if she tolerated exercises today.  Balance, steps.   Consulted and Agree with Plan of Care Patient        Problem List Patient Active Problem List   Diagnosis Date Noted  . Sciatica 06/18/2014  . GERD (gastroesophageal reflux disease) 12/31/2012  . Toe pain, right 5th toe 07/18/2012  . Breast  cancer screening 11/10/2011  . Rotator cuff tear, right 11/08/2011  . Rotator cuff tendonitis 05/26/2011  . Biceps tendon rupture, proximal 01/27/2011  . Hand contusion 01/27/2011  . Right shoulder pain 12/28/2010  . Insomnia 12/28/2010  . FOOT PAIN, LEFT 08/04/2010  . TRIGGER FINGER 06/03/2010  . Hyperlipidemia 03/17/2008  . DEGENERATIVE JOINT DISEASE, FINGERS 01/30/2008  . Hypertension 04/24/2007  . ANXIETY DISORDER 06/10/2006  . Obsessive-compulsive disorder 06/10/2006  . MITRAL VALVE PROLAPSE 06/10/2006  . SINUSITIS, CHRONIC 06/10/2006  . Allergic rhinitis 06/10/2006  . Ulcerative colitis 06/10/2006  . CARPAL TUNNEL RELEASE, HX OF 06/10/2006    Jo Booze 12/08/2014, 4:46 PM  [facility] [address] [place], [place], [address] Phone: [phone]   Fax:  [phone]  Melvenia Needles, PTA 12/08/2014 4:46 PM Phone: [phone] Fax: [phone]

## 2014-12-11 ENCOUNTER — Ambulatory Visit: Payer: 59 | Admitting: Physical Therapy

## 2014-12-11 DIAGNOSIS — R293 Abnormal posture: Secondary | ICD-10-CM

## 2014-12-11 DIAGNOSIS — M545 Low back pain: Secondary | ICD-10-CM

## 2014-12-11 DIAGNOSIS — M5432 Sciatica, left side: Secondary | ICD-10-CM

## 2014-12-11 DIAGNOSIS — M5442 Lumbago with sciatica, left side: Secondary | ICD-10-CM | POA: Diagnosis not present

## 2014-12-11 NOTE — Therapy (Signed)
[place], [place], [address] Phone: [phone]   Fax:  [phone]  Physical Therapy Treatment  Patient Details  Name: Christina Burton MRN: [id number] Date of Birth: Jun 23, 1956 Referring Provider:  Milagros Loll, MD  Encounter Date: 12/11/2014      PT End of Session - 12/11/14 1803    Visit Number 15   Number of Visits 20   Date for PT Re-Evaluation 12/17/14   PT Start Time 6962   PT Stop Time 1650   PT Time Calculation (min) 60 min   Activity Tolerance Patient tolerated treatment well      Past Medical History  Diagnosis Date  . Sinusitis     chronic  . Mitral valve prolapse   . Ulcerative colitis     sees Dr. Collene Mares - normal colonoscopy in Nov 2011  . Anxiety disorder   . Obsessive compulsive disorder   . Allergic rhinitis     Past Surgical History  Procedure Laterality Date  . Carpal tunnel release    . Hernia repair    . Trigger finger release      right thumb  . Tonsillectomy and adenoidectomy      There were no vitals filed for this visit.  Visit Diagnosis:  Left low back pain, with sciatica presence unspecified  Abnormal posture  Sciatica, left      Subjective Assessment - 12/11/14 1603    Subjective Lt Buttock tingles , Thight feels good.   Currently in Pain? Yes   Pain Score 1    Pain Location Buttocks   Pain Orientation Left   Pain Descriptors / Indicators Tingling   Pain Type Chronic pain   Pain Radiating Towards LT gluteal   Pain Frequency Intermittent   Aggravating Factors  sitting too long   Pain Relieving Factors heated rice, good posture, exercises   Effect of Pain on Daily Activities avoids crossing legs, cant' sit too long   Multiple Pain Sites No                         [facility] Adult PT Treatment/Exercise - 12/11/14 1613    Ambulation/Gait   Stairs Yes   Gait Comments 12 steps in a row no increased pain.    Lumbar Exercises:  Stretches   Passive Hamstring Stretch 2 reps;20 seconds  Patient did both at same time :)   Single Knee to Chest Stretch 3 reps  5 second holds.   Piriformis Stretch 3 reps;30 seconds   Lumbar Exercises: Aerobic   Stationary Bike Nustep L4,  7 minutes   Lumbar Exercises: Machines for Strengthening   Leg Press --  1 plate 10 reps with cues and constant supervision for safet   Lumbar Exercises: Supine   Heel Slides 20 reps  feet on green ball, tactile and verbal given   Bridge 10 reps  2 sets , feet on green ball   Lumbar Exercises: Prone   Other Prone Lumbar Exercises multifitus, 5 erps 3 sets, also with knee    Knee/Hip Exercises: Stretches   Active Hamstring Stretch 3 reps;30 seconds   Knee/Hip Exercises: Sidelying   Hip ABduction 10 reps   Hip ABduction Limitations 4+/5 Rt   Clams 2 sets 10 reps  both sides   Knee/Hip Exercises: Prone   Straight Leg Raises Limitations MULtifitus   Moist Heat Therapy   Number Minutes Moist Heat 15 Minutes   Moist Heat Location --  back                  PT Short Term Goals - 11/10/14 1554    PT SHORT TERM GOAL #1   Title she will be independnet with inital HEp   Time 3   Period Weeks   Status Achieved   PT SHORT TERM GOAL #2   Title She will report pain decreased 30-40%   Status Achieved   PT SHORT TERM GOAL #3   Title she will report able to sit for 60 min with minimal discomfort   Baseline 1 hour   Time 3   Period Weeks   Status Achieved           PT Long Term Goals - 12/08/14 1644    PT LONG TERM GOAL #1   Title She will be independnet with all HEP issued as of last visit (12/10/2014)   Time 4   Period Weeks   Status On-going   PT LONG TERM GOAL #2   Title She will report no leg symptoms and minor back pain with sitting at computer. (12/10/2014)   Time 5   Period Weeks   Status On-going   PT LONG TERM GOAL #3   Title she will be able to navigate 15 steps with <3/10 pain to help with community ambulation  (12/10/2014)   Baseline no pain just fatigue   Time 4   Period Weeks   Status Achieved               Plan - 12/11/14 1804    Clinical Impression Statement Pain continues to centralize.  Now out of thigh.  Core. hips continue to benifit from exercises   PT Next Visit Plan Multifidus exercises for home if she tolerated exercises today.  Balance, steps.   Consulted and Agree with Plan of Care Patient        Problem List Patient Active Problem List   Diagnosis Date Noted  . Sciatica 06/18/2014  . GERD (gastroesophageal reflux disease) 12/31/2012  . Toe pain, right 5th toe 07/18/2012  . Breast cancer screening 11/10/2011  . Rotator cuff tear, right 11/08/2011  . Rotator cuff tendonitis 05/26/2011  . Biceps tendon rupture, proximal 01/27/2011  . Hand contusion 01/27/2011  . Right shoulder pain 12/28/2010  . Insomnia 12/28/2010  . FOOT PAIN, LEFT 08/04/2010  . TRIGGER FINGER 06/03/2010  . Hyperlipidemia 03/17/2008  . DEGENERATIVE JOINT DISEASE, FINGERS 01/30/2008  . Hypertension 04/24/2007  . ANXIETY DISORDER 06/10/2006  . Obsessive-compulsive disorder 06/10/2006  . MITRAL VALVE PROLAPSE 06/10/2006  . SINUSITIS, CHRONIC 06/10/2006  . Allergic rhinitis 06/10/2006  . Ulcerative colitis 06/10/2006  . CARPAL TUNNEL RELEASE, HX OF 06/10/2006    HARRIS,KAREN 12/11/2014, 6:06 PM  [facility] [address] [place], [place], [address] Phone: [phone]   Fax:  [phone]  Melvenia Needles, PTA 12/11/2014 6:06 PM Phone: [phone] Fax: [phone]

## 2014-12-16 ENCOUNTER — Ambulatory Visit: Payer: 59 | Admitting: Physical Therapy

## 2014-12-16 DIAGNOSIS — M545 Low back pain: Secondary | ICD-10-CM

## 2014-12-16 DIAGNOSIS — M5432 Sciatica, left side: Secondary | ICD-10-CM

## 2014-12-16 DIAGNOSIS — M5442 Lumbago with sciatica, left side: Secondary | ICD-10-CM | POA: Diagnosis not present

## 2014-12-16 DIAGNOSIS — R293 Abnormal posture: Secondary | ICD-10-CM

## 2014-12-16 NOTE — Addendum Note (Signed)
Addended by: Hulan Fray on: 12/16/2014 10:08 AM   Modules accepted: Orders

## 2014-12-16 NOTE — Therapy (Signed)
[place], [place], [address] Phone: [phone]   Fax:  [phone]  Physical Therapy Treatment  Patient Details  Name: Christina Burton MRN: [id number] Date of Birth: [birth year] Referring Provider:  Milagros Loll, MD  Encounter Date: 12/16/2014      PT End of Session - 12/16/14 1756    Visit Number 16   Number of Visits 20   Date for PT Re-Evaluation 12/17/14   PT Start Time 6834   PT Stop Time 1630   PT Time Calculation (min) 50 min   Activity Tolerance Patient tolerated treatment well   Behavior During Therapy [facility] for tasks assessed/performed      Past Medical History  Diagnosis Date  . Sinusitis     chronic  . Mitral valve prolapse   . Ulcerative colitis     sees Dr. Collene Mares - normal colonoscopy in Nov 2011  . Anxiety disorder   . Obsessive compulsive disorder   . Allergic rhinitis     Past Surgical History  Procedure Laterality Date  . Carpal tunnel release    . Hernia repair    . Trigger finger release      right thumb  . Tonsillectomy and adenoidectomy      There were no vitals filed for this visit.  Visit Diagnosis:  Left low back pain, with sciatica presence unspecified  Abnormal posture  Sciatica, left      Subjective Assessment - 12/16/14 1546    Subjective Lt buttock.  No thigh pain. 2/10 intermittant a little graB in buttock.   Currently in Pain? Yes   Pain Score 2    Pain Location Buttocks   Pain Orientation Left  only   Pain Descriptors / Indicators --  tingles, grabs   Pain Type Chronic pain   Pain Radiating Towards Lt gluteal   Pain Frequency Intermittent   Aggravating Factors  sitting too long   Pain Relieving Factors heat, stretches, good posture   Effect of Pain on Daily Activities can't sit too long, avoids crossing legs   Multiple Pain Sites No                         [facility] Adult PT Treatment/Exercise - 12/16/14 1545    High  Level Balance   High Level Balance Activities --  static, dynamic narrowed base head moves eyes open/closed    High Level Balance Comments close SBA for safety, climb ladder felt funny 4th rung.    Lumbar Exercises: Stretches   Double Knee to Chest Stretch --  10 reps with ball, cued for core stabilization   Lumbar Exercises: Aerobic   Stationary Bike Nustep L4,  7 minutes   Lumbar Exercises: Supine   Bridge 10 reps  2 sets , legs over orange ball.   Lumbar Exercises: Prone   Other Prone Lumbar Exercises multifitus, 5 erps 3 sets, also with knee   ,3 sets of 5 reps with knee flexion, with SLR 5 reps , monit   Knee/Hip Exercises: Standing   Other Standing Knee Exercises ladder climb 4 rungs   Knee/Hip Exercises: Supine   Bridges 10 reps;2 sets   Knee/Hip Exercises: Prone   Straight Leg Raises Limitations multifitus 5 reps 3 sets he added knee flexion tactile, verbal cues. less fatigued    Manual Therapy   Manual Therapy --  trigger point release with tennis ball to lt gluteal   Soft tissue mobilization  tissue softened                  PT Short Term Goals - 11/10/14 1554    PT SHORT TERM GOAL #1   Title she will be independnet with inital HEp   Time 3   Period Weeks   Status Achieved   PT SHORT TERM GOAL #2   Title She will report pain decreased 30-40%   Status Achieved   PT SHORT TERM GOAL #3   Title she will report able to sit for 60 min with minimal discomfort   Baseline 1 hour   Time 3   Period Weeks   Status Achieved           PT Long Term Goals - 12/08/14 1644    PT LONG TERM GOAL #1   Title She will be independnet with all HEP issued as of last visit (12/10/2014)   Time 4   Period Weeks   Status On-going   PT LONG TERM GOAL #2   Title She will report no leg symptoms and minor back pain with sitting at computer. (12/10/2014)   Time 5   Period Weeks   Status On-going   PT LONG TERM GOAL #3   Title she will be able to navigate 15 steps with <3/10  pain to help with community ambulation (12/10/2014)   Baseline no pain just fatigue   Time 4   Period Weeks   Status Achieved               Plan - 12/16/14 1757    Clinical Impression Statement pain about the same as last visit, Balance will improvewith challanges.  She is doing her home exercises and is adjusting to  the information of the  need of continued llifelong exercise.   PT Next Visit Plan balance. core, try quadriped.   Consulted and Agree with Plan of Care Patient        Problem List Patient Active Problem List   Diagnosis Date Noted  . Sciatica 06/18/2014  . GERD (gastroesophageal reflux disease) 12/31/2012  . Toe pain, right 5th toe 07/18/2012  . Breast cancer screening 11/10/2011  . Rotator cuff tear, right 11/08/2011  . Rotator cuff tendonitis 05/26/2011  . Biceps tendon rupture, proximal 01/27/2011  . Hand contusion 01/27/2011  . Right shoulder pain 12/28/2010  . Insomnia 12/28/2010  . FOOT PAIN, LEFT 08/04/2010  . TRIGGER FINGER 06/03/2010  . Hyperlipidemia 03/17/2008  . DEGENERATIVE JOINT DISEASE, FINGERS 01/30/2008  . Hypertension 04/24/2007  . ANXIETY DISORDER 06/10/2006  . Obsessive-compulsive disorder 06/10/2006  . MITRAL VALVE PROLAPSE 06/10/2006  . SINUSITIS, CHRONIC 06/10/2006  . Allergic rhinitis 06/10/2006  . Ulcerative colitis 06/10/2006  . CARPAL TUNNEL RELEASE, HX OF 06/10/2006    HARRIS,KAREN 12/16/2014, 6:02 PM  [facility] [address] [place], [place], [address] Phone: [phone]   Fax:  [phone] Melvenia Needles, PTA 12/16/2014 6:02 PM Phone: [phone] Fax: [phone]

## 2014-12-18 ENCOUNTER — Ambulatory Visit: Payer: 59 | Admitting: Physical Therapy

## 2014-12-18 DIAGNOSIS — M545 Low back pain: Secondary | ICD-10-CM

## 2014-12-18 DIAGNOSIS — M5442 Lumbago with sciatica, left side: Secondary | ICD-10-CM | POA: Diagnosis not present

## 2014-12-18 DIAGNOSIS — M5432 Sciatica, left side: Secondary | ICD-10-CM

## 2014-12-18 DIAGNOSIS — R293 Abnormal posture: Secondary | ICD-10-CM

## 2014-12-18 NOTE — Therapy (Signed)
[place], [place], [address] Phone: [phone]   Fax:  [phone]  Physical Therapy Treatment / re-evaluation  Patient Details  Name: Christina Burton MRN: [id number] Date of Birth: 06-01-1956 Referring Provider:  Milagros Loll, MD  Encounter Date: 12/18/2014      PT End of Session - 12/18/14 1749    Activity Tolerance Patient tolerated treatment well   Behavior During Therapy [facility] for tasks assessed/performed      Past Medical History  Diagnosis Date  . Sinusitis     chronic  . Mitral valve prolapse   . Ulcerative colitis     sees Dr. Collene Mares - normal colonoscopy in Nov 2011  . Anxiety disorder   . Obsessive compulsive disorder   . Allergic rhinitis     Past Surgical History  Procedure Laterality Date  . Carpal tunnel release    . Hernia repair    . Trigger finger release      right thumb  . Tonsillectomy and adenoidectomy      There were no vitals filed for this visit.  Visit Diagnosis:  Left low back pain, with sciatica presence unspecified - Plan: PT plan of care cert/re-cert  Abnormal posture - Plan: PT plan of care cert/re-cert  Sciatica, left - Plan: PT plan of care cert/re-cert      Subjective Assessment - 12/18/14 1554    Subjective "overall everyting is going well, however the butt is still a little numb and I can't cross my legs for longer than a little bit"   Currently in Pain? Yes   Pain Location Buttocks   Pain Orientation Left   Pain Descriptors / Indicators Aching   Pain Type Chronic pain   Pain Frequency Constant   Aggravating Factors  steps, sitting for too long   Pain Relieving Factors heat and stretching.            [facility] PT Assessment - 12/18/14 0001    Observation/Other Assessments   Focus on Therapeutic Outcomes (FOTO)  45% limitation   AROM   Lumbar Flexion 120   Lumbar Extension 36   Lumbar - Right Side Bend 48   Lumbar - Left Side Bend 38   Lumbar - Right Rotation 75%   Lumbar - Left Rotation 75%                     [facility] Adult PT Treatment/Exercise - 12/18/14 0001    Lumbar Exercises: Stretches   Passive Hamstring Stretch 2 reps;20 seconds   Single Knee to Chest Stretch 3 reps   Double Knee to Chest Stretch 2 reps;30 seconds   Lumbar Exercises: Supine   Bent Knee Raise 10 reps   Bridge 10 reps   Manual Therapy   Manual Therapy Myofascial release   Myofascial Release DTM of pirioformis/ gluteal area.                PT Education - 12/18/14 1749    Education provided Yes   Education Details updated POC   Person(s) Educated Patient   Methods Explanation   Comprehension Verbalized understanding          PT Short Term Goals - 12/18/14 1608    PT SHORT TERM GOAL #1   Title she will be independnet with inital HEp   Time 3   Period Weeks   Status Achieved   PT SHORT TERM GOAL #2   Title She will report pain decreased  30-40%   Time 3   Period Weeks   Status Achieved   PT SHORT TERM GOAL #3   Title she will report able to sit for 60 min with minimal discomfort   Baseline 1 hour   Time 3   Period Weeks   Status Achieved           PT Long Term Goals - 12/18/14 1608    PT LONG TERM GOAL #1   Title She will be independnet with all HEP issued as of last visit (12/10/2014)   Time 4   Period Weeks   Status Achieved   PT LONG TERM GOAL #2   Title She will report no leg symptoms and minor back pain with sitting at computer. (12/10/2014)   Baseline 1.5-2 hours upper thigh pain back pain a twinge   Time 5   Period Weeks   Status Partially Met   PT LONG TERM GOAL #3   Title she will be able to navigate 15 steps with <3/10 pain to help with community ambulation (12/10/2014)   Baseline no pain just fatigue   Time 4   Period Weeks   Status Achieved               Plan - 12/18/14 1749    Clinical Impression Statement Vicie has improved her trunk mobiity in all planes however  continues to demonstrate pain in the L gluteal area with intermittent referral of symptoms to the left lower leg. she achieved LTG 1 and partially met LTG 2. She has demonstrated mild improvement in her FOTO score however remarks that since she deosn't perform the tasks with proper form it is incredibly difficult for her. Plan to progress her to 1 x a week to work on transferring the pt to independent home exercise.    Pt will benefit from skilled therapeutic intervention in order to improve on the following deficits Decreased range of motion;Pain;Increased muscle spasms;Decreased activity tolerance;Postural dysfunction   Rehab Potential Good   PT Frequency 1x / week   PT Duration --  5 weeks   PT Treatment/Interventions Moist Heat;Electrical Stimulation;Patient/family education;Therapeutic activities;Therapeutic exercise;Dry needling;Manual techniques;Neuromuscular re-education;Balance training;Cryotherapy;ADLs/Self Care Home Management   PT Next Visit Plan balance. core, try quadriped.   PT Home Exercise Plan HEP review   Consulted and Agree with Plan of Care Patient        Problem List Patient Active Problem List   Diagnosis Date Noted  . Sciatica 06/18/2014  . GERD (gastroesophageal reflux disease) 12/31/2012  . Toe pain, right 5th toe 07/18/2012  . Breast cancer screening 11/10/2011  . Rotator cuff tear, right 11/08/2011  . Rotator cuff tendonitis 05/26/2011  . Biceps tendon rupture, proximal 01/27/2011  . Hand contusion 01/27/2011  . Right shoulder pain 12/28/2010  . Insomnia 12/28/2010  . FOOT PAIN, LEFT 08/04/2010  . TRIGGER FINGER 06/03/2010  . Hyperlipidemia 03/17/2008  . DEGENERATIVE JOINT DISEASE, FINGERS 01/30/2008  . Hypertension 04/24/2007  . ANXIETY DISORDER 06/10/2006  . Obsessive-compulsive disorder 06/10/2006  . MITRAL VALVE PROLAPSE 06/10/2006  . SINUSITIS, CHRONIC 06/10/2006  . Allergic rhinitis 06/10/2006  . Ulcerative colitis 06/10/2006  . CARPAL TUNNEL  RELEASE, HX OF 06/10/2006   Starr Lake PT, DPT, LAT, ATC  12/18/2014  6:09 PM    [facility] [address] [place], [place], [address] Phone: [phone]   Fax:  [phone]

## 2015-01-05 ENCOUNTER — Ambulatory Visit: Payer: 59 | Attending: Internal Medicine | Admitting: Physical Therapy

## 2015-01-05 DIAGNOSIS — R293 Abnormal posture: Secondary | ICD-10-CM

## 2015-01-05 DIAGNOSIS — M5432 Sciatica, left side: Secondary | ICD-10-CM | POA: Insufficient documentation

## 2015-01-05 DIAGNOSIS — M545 Low back pain: Secondary | ICD-10-CM | POA: Diagnosis present

## 2015-01-05 NOTE — Therapy (Signed)
[place], [place], [address] Phone: [phone]   Fax:  [phone]  Physical Therapy Treatment  Patient Details  Name: Christina Burton MRN: [id number] Date of Birth: [birth year] Referring Provider:  Milagros Loll, MD  Encounter Date: 01/05/2015      PT End of Session - 01/05/15 1645    Visit Number 18   Number of Visits 22   Date for PT Re-Evaluation 01/22/15   PT Start Time 6244   PT Stop Time 1650   PT Time Calculation (min) 62 min   Activity Tolerance Patient tolerated treatment well      Past Medical History  Diagnosis Date  . Sinusitis     chronic  . Mitral valve prolapse   . Ulcerative colitis     sees Dr. Collene Mares - normal colonoscopy in Nov 2011  . Anxiety disorder   . Obsessive compulsive disorder   . Allergic rhinitis     Past Surgical History  Procedure Laterality Date  . Carpal tunnel release    . Hernia repair    . Trigger finger release      right thumb  . Tonsillectomy and adenoidectomy      There were no vitals filed for this visit.  Visit Diagnosis:  Left low back pain, with sciatica presence unspecified  Abnormal posture      Subjective Assessment - 01/05/15 1559    Subjective Feels something moves in mid back Lt not painful. lasts a minute.  It will quit after rubbing it. Able to sit 1.5 to 2 hours now.   Currently in Pain? No/denies   Pain Score --  2/10 at most   Pain Location Buttocks   Pain Orientation Left   Pain Descriptors / Indicators Throbbing   Pain Type Chronic pain   Pain Radiating Towards Lt gluteal   Aggravating Factors  Sitting too long, Going up steps   Pain Relieving Factors stretches.   Multiple Pain Sites No                         [facility] Adult PT Treatment/Exercise - 01/05/15 1606    High Level Balance   High Level Balance Comments on compliant and noncompliant surface, narrowed and wider base of support , static and  dynamic activities.    SBA, cues intermittantly, mirror used  for instruction.   Lumbar Exercises: Aerobic   Stationary Bike Nustep Level 5 ,7 minutes   Lumbar Exercises: Standing   Wall Slides 10 reps  10 second holds   Knee/Hip Exercises: Standing   Side Lunges --  1 lap around mat, small squat    Forward Step Up 10 reps  1 hand , 0 hands, 2 sets.   Wall Squat 10 reps;10 seconds  cued to press heels during with feet flat   Rocker Board 1 minute   Rocker Board Limitations heel and toe presses, close SBA 1 LOB posterior PTA helped to correct   Knee/Hip Exercises: Supine   Bridges 10 reps  cued to lower 1 bone at a time.   Knee/Hip Exercises: Prone   Other Prone Exercises Leaning over counter, bent knee leg lifts, AA initially, 10 reps each   Moist Heat Therapy   Number Minutes Moist Heat 15 Minutes  extra layers to prevent overheating.   Moist Heat Location --  Back  PT Short Term Goals - 12/18/14 1608    PT SHORT TERM GOAL #1   Title she will be independnet with inital HEp   Time 3   Period Weeks   Status Achieved   PT SHORT TERM GOAL #2   Title She will report pain decreased 30-40%   Time 3   Period Weeks   Status Achieved   PT SHORT TERM GOAL #3   Title she will report able to sit for 60 min with minimal discomfort   Baseline 1 hour   Time 3   Period Weeks   Status Achieved           PT Long Term Goals - 01/05/15 1649    PT LONG TERM GOAL #1   Title She will be independnet with all HEP issued as of last visit (12/10/2014)   Time 4   Period Weeks   Status Achieved   PT LONG TERM GOAL #2   Title She will report no leg symptoms and minor back pain with sitting at computer. (12/10/2014)   Baseline 1.5-2 hours upper thigh pain back pain a twinge   Time 5   Period Weeks   Status Partially Met   PT LONG TERM GOAL #3   Title she will be able to navigate 15 steps with <3/10 pain to help with community ambulation (12/10/2014)   Time 4    Period Weeks   Status Achieved               Plan - 01/05/15 1647    Clinical Impression Statement 2-3/10 pain Lt gluteal post exercise.  She is making progress with sitting   PT Next Visit Plan balance. core, try quadriped.   Consulted and Agree with Plan of Care Patient        Problem List Patient Active Problem List   Diagnosis Date Noted  . Sciatica 06/18/2014  . GERD (gastroesophageal reflux disease) 12/31/2012  . Toe pain, right 5th toe 07/18/2012  . Breast cancer screening 11/10/2011  . Rotator cuff tear, right 11/08/2011  . Rotator cuff tendonitis 05/26/2011  . Biceps tendon rupture, proximal 01/27/2011  . Hand contusion 01/27/2011  . Right shoulder pain 12/28/2010  . Insomnia 12/28/2010  . FOOT PAIN, LEFT 08/04/2010  . TRIGGER FINGER 06/03/2010  . Hyperlipidemia 03/17/2008  . DEGENERATIVE JOINT DISEASE, FINGERS 01/30/2008  . Hypertension 04/24/2007  . ANXIETY DISORDER 06/10/2006  . Obsessive-compulsive disorder 06/10/2006  . MITRAL VALVE PROLAPSE 06/10/2006  . SINUSITIS, CHRONIC 06/10/2006  . Allergic rhinitis 06/10/2006  . Ulcerative colitis 06/10/2006  . CARPAL TUNNEL RELEASE, HX OF 06/10/2006    Rylei Masella 01/05/2015, 4:51 PM  [facility] [address] [place], [place], [address] Phone: [phone]   Fax:  [phone]  Melvenia Needles, PTA 01/05/2015 4:51 PM Phone: [phone] Fax: [phone]

## 2015-01-12 ENCOUNTER — Ambulatory Visit: Payer: 59 | Admitting: Physical Therapy

## 2015-01-12 DIAGNOSIS — M545 Low back pain: Secondary | ICD-10-CM | POA: Diagnosis not present

## 2015-01-12 DIAGNOSIS — M5432 Sciatica, left side: Secondary | ICD-10-CM

## 2015-01-12 DIAGNOSIS — R293 Abnormal posture: Secondary | ICD-10-CM

## 2015-01-12 NOTE — Therapy (Signed)
[place], [place], [address] Phone: [phone]   Fax:  [phone]  Physical Therapy Treatment  Patient Details  Name: Christina Burton MRN: [id number] Date of Birth: 08-18-1955 Referring Provider:  Milagros Loll, MD  Encounter Date: 01/12/2015      PT End of Session - 01/12/15 1802    Visit Number 19   Number of Visits 22   Date for PT Re-Evaluation 01/22/15   PT Start Time 7902   PT Stop Time 4097   PT Time Calculation (min) 60 min   Activity Tolerance Patient tolerated treatment well      Past Medical History  Diagnosis Date  . Sinusitis     chronic  . Mitral valve prolapse   . Ulcerative colitis     sees Dr. Collene Mares - normal colonoscopy in Nov 2011  . Anxiety disorder   . Obsessive compulsive disorder   . Allergic rhinitis     Past Surgical History  Procedure Laterality Date  . Carpal tunnel release    . Hernia repair    . Trigger finger release      right thumb  . Tonsillectomy and adenoidectomy      There were no vitals filed for this visit.  Visit Diagnosis:  Abnormal posture  Left low back pain, with sciatica presence unspecified  Sciatica, left      Subjective Assessment - 01/12/15 1558    Subjective Pain intermittant.  Numbness constant Lt leg, gluteal.  Worse sitting 1.5 to 2 hours.  Has been doing wall sits at home. as well as oters exercises .  Arms sore from holding a child.                         [facility] Adult PT Treatment/Exercise - 01/12/15 1609    Lumbar Exercises: Aerobic   Stationary Bike Nustep 7 minutes L5   Lumbar Exercises: Machines for Strengthening   Leg Press 2 sets 10 reps  SBA for safety   Lumbar Exercises: Standing   Wall Slides 10 reps;5 seconds   Lumbar Exercises: Prone   Other Prone Lumbar Exercises multifitus,with knee flexion and with straight leg lift monitored for fatigue and activation. up to 10 reps each, cues needed    Knee/Hip Exercises: Standing   Forward Step Up 20 reps  7 inch step each   Wall Squat 10 reps;10 seconds  cued to press heels during with feet flat   Knee/Hip Exercises: Supine   Bridges 10 reps  legs on orange ball, 1 set 5 reps feet on mat.   Knee/Hip Exercises: Sidelying   Clams --  10 reps 2 sets cues for breathing   Moist Heat Therapy   Number Minutes Moist Heat 15 Minutes  with extra layers   Moist Heat Location --  back                  PT Short Term Goals - 12/18/14 1608    PT SHORT TERM GOAL #1   Title she will be independnet with inital HEp   Time 3   Period Weeks   Status Achieved   PT SHORT TERM GOAL #2   Title She will report pain decreased 30-40%   Time 3   Period Weeks   Status Achieved   PT SHORT TERM GOAL #3   Title she will report able to sit for 60 min with minimal discomfort   Baseline 1  hour   Time 3   Period Weeks   Status Achieved           PT Long Term Goals - 01/05/15 1649    PT LONG TERM GOAL #1   Title She will be independnet with all HEP issued as of last visit (12/10/2014)   Time 4   Period Weeks   Status Achieved   PT LONG TERM GOAL #2   Title She will report no leg symptoms and minor back pain with sitting at computer. (12/10/2014)   Baseline 1.5-2 hours upper thigh pain back pain a twinge   Time 5   Period Weeks   Status Partially Met   PT LONG TERM GOAL #3   Title she will be able to navigate 15 steps with <3/10 pain to help with community ambulation (12/10/2014)   Time 4   Period Weeks   Status Achieved               Plan - 01/12/15 1804    Clinical Impression Statement --  twinge of gluteal pain post session   PT Next Visit Plan balance. core, try quadriped.  FOTO         Problem List Patient Active Problem List   Diagnosis Date Noted  . Sciatica 06/18/2014  . GERD (gastroesophageal reflux disease) 12/31/2012  . Toe pain, right 5th toe 07/18/2012  . Breast cancer screening 11/10/2011  .  Rotator cuff tear, right 11/08/2011  . Rotator cuff tendonitis 05/26/2011  . Biceps tendon rupture, proximal 01/27/2011  . Hand contusion 01/27/2011  . Right shoulder pain 12/28/2010  . Insomnia 12/28/2010  . FOOT PAIN, LEFT 08/04/2010  . TRIGGER FINGER 06/03/2010  . Hyperlipidemia 03/17/2008  . DEGENERATIVE JOINT DISEASE, FINGERS 01/30/2008  . Hypertension 04/24/2007  . ANXIETY DISORDER 06/10/2006  . Obsessive-compulsive disorder 06/10/2006  . MITRAL VALVE PROLAPSE 06/10/2006  . SINUSITIS, CHRONIC 06/10/2006  . Allergic rhinitis 06/10/2006  . Ulcerative colitis 06/10/2006  . CARPAL TUNNEL RELEASE, HX OF 06/10/2006    Kimblery Diop 01/12/2015, 6:06 PM  [facility] [address] [place], [place], [address] Phone: [phone]   Fax:  [phone]    Melvenia Needles, PTA 01/12/2015 6:06 PM Phone: [phone] Fax: [phone]

## 2015-01-19 ENCOUNTER — Ambulatory Visit: Payer: 59 | Admitting: Physical Therapy

## 2015-01-19 DIAGNOSIS — R293 Abnormal posture: Secondary | ICD-10-CM

## 2015-01-19 DIAGNOSIS — M545 Low back pain: Secondary | ICD-10-CM

## 2015-01-20 NOTE — Therapy (Signed)
[place], [place], [address] Phone: [phone]   Fax:  [phone]  Physical Therapy Treatment  Patient Details  Name: Christina Burton MRN: [id number] Date of Birth: [birth year] Referring Provider:  Milagros Loll, MD  Encounter Date: 01/19/2015      PT End of Session - 01/20/15 1723    PT Start Time 1545   PT Stop Time 1645   PT Time Calculation (min) 60 min   Activity Tolerance Patient tolerated treatment well   Behavior During Therapy [facility] for tasks assessed/performed      Past Medical History  Diagnosis Date  . Sinusitis     chronic  . Mitral valve prolapse   . Ulcerative colitis     sees Dr. Collene Mares - normal colonoscopy in Nov 2011  . Anxiety disorder   . Obsessive compulsive disorder   . Allergic rhinitis     Past Surgical History  Procedure Laterality Date  . Carpal tunnel release    . Hernia repair    . Trigger finger release      right thumb  . Tonsillectomy and adenoidectomy      There were no vitals filed for this visit.  Visit Diagnosis:  Abnormal posture  Left low back pain, with sciatica presence unspecified      Subjective Assessment - 01/19/15 1552    Subjective Tries to get up every hour.  1/10 pain mid gluteal   Currently in Pain? Yes   Pain Score 1    Pain Location Buttocks   Pain Orientation Left   Pain Descriptors / Indicators Throbbing;Numbness   Pain Type Chronic pain   Pain Radiating Towards Lt gluteal   Aggravating Factors  siutting too long   Pain Relieving Factors stretches   Multiple Pain Sites No                         [facility] Adult PT Treatment/Exercise - 01/19/15 1555    Lumbar Exercises: Stretches   Passive Hamstring Stretch 3 reps;20 seconds  pump of foot   Piriformis Stretch 3 reps;30 seconds  each   Lumbar Exercises: Aerobic   Stationary Bike Nustep  L5 6 minutes   Lumbar Exercises: Standing   Wall Slides 10 reps   progressed to 10 reps   Other Standing Lumbar Exercises Single leg hold with moving opposite   Other Standing Lumbar Exercises squat walk 1 lap around mat   Lumbar Exercises: Quadruped   [place] --  5 reps   Single Arm Raise 5 reps  each, cues   Straight Leg Raise 5 reps   Straight Leg Raises Limitations smaller motions with control   Opposite Arm/Leg Raise 5 reps  shakey   Knee/Hip Exercises: Standing   Forward Step Up 20 reps  cues   Wall Squat 10 reps;10 seconds  cued for feet out from wall more   SLS with Vectors various balance challanges, narrowed base, static and dynamic SBA, patient able to self correct   Moist Heat Therapy   Number Minutes Moist Heat 15 Minutes   Moist Heat Location --  back                  PT Short Term Goals - 12/18/14 1608    PT SHORT TERM GOAL #1   Title she will be independnet with inital HEp   Time 3   Period Weeks   Status Achieved   PT  SHORT TERM GOAL #2   Title She will report pain decreased 30-40%   Time 3   Period Weeks   Status Achieved   PT SHORT TERM GOAL #3   Title she will report able to sit for 60 min with minimal discomfort   Baseline 1 hour   Time 3   Period Weeks   Status Achieved           PT Long Term Goals - 01/05/15 1649    PT LONG TERM GOAL #1   Title She will be independnet with all HEP issued as of last visit (12/10/2014)   Time 4   Period Weeks   Status Achieved   PT LONG TERM GOAL #2   Title She will report no leg symptoms and minor back pain with sitting at computer. (12/10/2014)   Baseline 1.5-2 hours upper thigh pain back pain a twinge   Time 5   Period Weeks   Status Partially Met   PT LONG TERM GOAL #3   Title she will be able to navigate 15 steps with <3/10 pain to help with community ambulation (12/10/2014)   Time 4   Period Weeks   Status Achieved               Plan - 01/20/15 1723    Clinical Impression Statement Pain increased low back with standing hip  strengthening.  4/10 pain, better with heat.  Balance and strengthening continue.  No new goals met.  She will continue 1 X a week until POC ends to maximize her potential.   PT Next Visit Plan balance. core, try quadriped.      FOTO score 41% limitation    Problem List Patient Active Problem List   Diagnosis Date Noted  . Sciatica 06/18/2014  . GERD (gastroesophageal reflux disease) 12/31/2012  . Toe pain, right 5th toe 07/18/2012  . Breast cancer screening 11/10/2011  . Rotator cuff tear, right 11/08/2011  . Rotator cuff tendonitis 05/26/2011  . Biceps tendon rupture, proximal 01/27/2011  . Hand contusion 01/27/2011  . Right shoulder pain 12/28/2010  . Insomnia 12/28/2010  . FOOT PAIN, LEFT 08/04/2010  . TRIGGER FINGER 06/03/2010  . Hyperlipidemia 03/17/2008  . DEGENERATIVE JOINT DISEASE, FINGERS 01/30/2008  . Hypertension 04/24/2007  . ANXIETY DISORDER 06/10/2006  . Obsessive-compulsive disorder 06/10/2006  . MITRAL VALVE PROLAPSE 06/10/2006  . SINUSITIS, CHRONIC 06/10/2006  . Allergic rhinitis 06/10/2006  . Ulcerative colitis 06/10/2006  . CARPAL TUNNEL RELEASE, HX OF 06/10/2006    Saren Corkern 01/20/2015, 5:29 PM  [facility] [address] [place], [place], [address] Phone: [phone]   Fax:  [phone]     Melvenia Needles, PTA 01/20/2015 5:29 PM Phone: [phone] Fax: [phone]

## 2015-01-26 ENCOUNTER — Ambulatory Visit: Payer: 59 | Admitting: Physical Therapy

## 2015-01-26 DIAGNOSIS — R293 Abnormal posture: Secondary | ICD-10-CM

## 2015-01-26 DIAGNOSIS — M5432 Sciatica, left side: Secondary | ICD-10-CM

## 2015-01-26 DIAGNOSIS — M545 Low back pain: Secondary | ICD-10-CM | POA: Diagnosis not present

## 2015-01-26 NOTE — Patient Instructions (Signed)
   Katlynne Mckercher PT, DPT, LAT, ATC  [facility] Phone: [phone]     

## 2015-01-26 NOTE — Therapy (Signed)
[place], [place], [address] Phone: [phone]   Fax:  [phone]  Physical Therapy Treatment  Patient Details  Name: Christina Burton MRN: [id number] Date of Birth: November 06, 1955 Referring Provider:  Milagros Loll, MD  Encounter Date: 01/26/2015      PT End of Session - 01/26/15 1539    Visit Number 23   Number of Visits 22   Date for PT Re-Evaluation 01/22/15   PT Start Time 1500   PT Stop Time 1545   PT Time Calculation (min) 45 min   Activity Tolerance Patient tolerated treatment well   Behavior During Therapy Christina Burton for tasks assessed/performed      Past Medical History  Diagnosis Date  . Sinusitis     chronic  . Mitral valve prolapse   . Ulcerative colitis     sees Dr. Collene Burton - normal colonoscopy in Nov 2011  . Anxiety disorder   . Obsessive compulsive disorder   . Allergic rhinitis     Past Surgical History  Procedure Laterality Date  . Carpal tunnel release    . Hernia repair    . Trigger finger release      right thumb  . Tonsillectomy and adenoidectomy      There were no vitals filed for this visit.  Visit Diagnosis:  Abnormal posture  Left low back pain, with sciatica presence unspecified  Sciatica, left      Subjective Assessment - 01/26/15 1505    Subjective "I have been working on my exercises and wall sits"    Currently in Pain? Yes   Pain Score 1    Pain Location Hip   Pain Orientation Left   Pain Onset More than a month ago   Pain Frequency Constant            [facility] PT Assessment - 01/26/15 0001    Observation/Other Assessments   Focus on Therapeutic Outcomes (FOTO)  41% limitation   AROM   Lumbar Flexion 118   Lumbar Extension 28   Lumbar - Right Side Bend 38   Lumbar - Left Side Bend 36   Lumbar - Right Rotation 75%   Lumbar - Left Rotation 75%   Strength   Overall Strength Comments LE WNL                     [facility] Adult PT  Treatment/Exercise - 01/26/15 0001    High Level Balance   High Level Balance Activities Backward walking;Tandem walking   High Level Balance Comments --  x 10 ft   Lumbar Exercises: Stretches   Passive Hamstring Stretch 3 reps;20 seconds   Piriformis Stretch 3 reps;30 seconds   Lumbar Exercises: Supine   Bridge 10 reps  with clam shell with green theraband.    Lumbar Exercises: Quadruped   Single Arm Raise 5 reps                PT Education - 01/26/15 1542    Education provided Yes   Education Details added advanced HEP, HEP review   Person(s) Educated Patient   Methods Explanation   Comprehension Verbalized understanding          PT Short Term Goals - 01/26/15 1513    PT SHORT TERM GOAL #1   Title she will be independnet with inital HEp   Time 3   Period Weeks   Status Achieved   PT SHORT TERM GOAL #2  Title She will report pain decreased 30-40%   Time 3   Period Weeks   Status Achieved   PT SHORT TERM GOAL #3   Title she will report able to sit for 60 min with minimal discomfort   Baseline 1 hour   Time 3   Period Weeks   Status Achieved           PT Long Term Goals - 01/26/15 1514    PT LONG TERM GOAL #1   Title She will be independnet with all HEP issued as of last visit (12/10/2014)   Time 4   Period Weeks   Status Achieved   PT LONG TERM GOAL #2   Title She will report no leg symptoms and minor back pain with sitting at computer. (12/10/2014)   Baseline 1.5-2 hours upper thigh pain back pain a twinge   Time 5   Period Weeks   Status Achieved   PT LONG TERM GOAL #3   Title she will be able to navigate 15 steps with <3/10 pain to help with community ambulation (12/10/2014)   Baseline no pain just fatigue   Time 4   Period Weeks   Status Achieved               Plan - 01/26/15 1540    Clinical Impression Statement Wonder has made great progress with decreased pain and improved AROM and strength, as well as balance. She met all  goals this visit. She is able to maintain her current level of function independentl and no longer needs skilled physical therapy today and will be discharged, and pt agreed with decision.    PT Next Visit Plan discharged from PT   Consulted and Agree with Plan of Care Patient        Problem List Patient Active Problem List   Diagnosis Date Noted  . Sciatica 06/18/2014  . GERD (gastroesophageal reflux disease) 12/31/2012  . Toe pain, right 5th toe 07/18/2012  . Breast cancer screening 11/10/2011  . Rotator cuff tear, right 11/08/2011  . Rotator cuff tendonitis 05/26/2011  . Biceps tendon rupture, proximal 01/27/2011  . Hand contusion 01/27/2011  . Right shoulder pain 12/28/2010  . Insomnia 12/28/2010  . FOOT PAIN, LEFT 08/04/2010  . TRIGGER FINGER 06/03/2010  . Hyperlipidemia 03/17/2008  . DEGENERATIVE JOINT DISEASE, FINGERS 01/30/2008  . Hypertension 04/24/2007  . ANXIETY DISORDER 06/10/2006  . Obsessive-compulsive disorder 06/10/2006  . MITRAL VALVE PROLAPSE 06/10/2006  . SINUSITIS, CHRONIC 06/10/2006  . Allergic rhinitis 06/10/2006  . Ulcerative colitis 06/10/2006  . CARPAL TUNNEL RELEASE, HX OF 06/10/2006                             PHYSICAL THERAPY DISCHARGE SUMMARY  Visits from Start of Care: 23  Current functional level related to goals / functional outcomes: FOTO 41%   Remaining deficits: Mild tightness after sitting for long periods of time.    Education / Equipment: HEP handout  Plan: Patient agrees to discharge.  Patient goals were met. Patient is being discharged due to meeting the stated rehab goals.  ?????       Christina Burton PT, DPT, LAT, ATC  01/26/2015  5:35 PM      [facility] [address] [place], [place], [address] Phone: [phone]   Fax:  [phone]

## 2015-03-23 ENCOUNTER — Telehealth: Payer: Self-pay | Admitting: *Deleted

## 2015-03-23 NOTE — Telephone Encounter (Signed)
PATIENT CALLED REQUESTING REFERRAL FOR ALLERGY SHOTS DUE TO HER INSURANCE. IN BASKET TO DR Randell Patient FOR THE REFERRAL.

## 2015-03-30 ENCOUNTER — Other Ambulatory Visit: Payer: Self-pay | Admitting: Pulmonary Disease

## 2015-03-30 DIAGNOSIS — J302 Other seasonal allergic rhinitis: Secondary | ICD-10-CM

## 2015-04-06 DIAGNOSIS — J4530 Mild persistent asthma, uncomplicated: Secondary | ICD-10-CM | POA: Insufficient documentation

## 2015-04-06 DIAGNOSIS — J4520 Mild intermittent asthma, uncomplicated: Secondary | ICD-10-CM

## 2015-05-01 ENCOUNTER — Ambulatory Visit (INDEPENDENT_AMBULATORY_CARE_PROVIDER_SITE_OTHER): Payer: 59 | Admitting: Neurology

## 2015-05-01 DIAGNOSIS — J309 Allergic rhinitis, unspecified: Secondary | ICD-10-CM | POA: Diagnosis not present

## 2015-05-01 NOTE — Progress Notes (Deleted)
[id number]

## 2015-05-07 ENCOUNTER — Ambulatory Visit: Payer: Self-pay | Admitting: Neurology

## 2015-05-07 DIAGNOSIS — J309 Allergic rhinitis, unspecified: Secondary | ICD-10-CM

## 2015-05-11 ENCOUNTER — Ambulatory Visit (INDEPENDENT_AMBULATORY_CARE_PROVIDER_SITE_OTHER): Payer: 59 | Admitting: Neurology

## 2015-05-11 DIAGNOSIS — J309 Allergic rhinitis, unspecified: Secondary | ICD-10-CM | POA: Diagnosis not present

## 2015-05-12 ENCOUNTER — Encounter: Payer: Self-pay | Admitting: Pulmonary Disease

## 2015-05-18 ENCOUNTER — Ambulatory Visit (INDEPENDENT_AMBULATORY_CARE_PROVIDER_SITE_OTHER): Payer: 59 | Admitting: Neurology

## 2015-05-18 DIAGNOSIS — J309 Allergic rhinitis, unspecified: Secondary | ICD-10-CM

## 2015-05-22 ENCOUNTER — Ambulatory Visit (HOSPITAL_COMMUNITY)
Admission: RE | Admit: 2015-05-22 | Discharge: 2015-05-22 | Disposition: A | Discharge status: Home / Self Care | Payer: 59 | Source: Ambulatory Visit | Attending: Internal Medicine | Admitting: Internal Medicine

## 2015-05-22 ENCOUNTER — Ambulatory Visit (INDEPENDENT_AMBULATORY_CARE_PROVIDER_SITE_OTHER): Payer: 59 | Admitting: Internal Medicine

## 2015-05-22 ENCOUNTER — Encounter: Payer: Self-pay | Admitting: Internal Medicine

## 2015-05-22 VITALS — BP 117/72 | HR 76 | Temp 97.7°F | Ht 63.0 in | Wt 146.7 lb

## 2015-05-22 DIAGNOSIS — M25512 Pain in left shoulder: Secondary | ICD-10-CM

## 2015-05-22 DIAGNOSIS — Y92009 Unspecified place in unspecified non-institutional (private) residence as the place of occurrence of the external cause: Principal | ICD-10-CM

## 2015-05-22 DIAGNOSIS — W19XXXA Unspecified fall, initial encounter: Secondary | ICD-10-CM

## 2015-05-22 DIAGNOSIS — M25522 Pain in left elbow: Secondary | ICD-10-CM | POA: Diagnosis not present

## 2015-05-22 DIAGNOSIS — M25511 Pain in right shoulder: Secondary | ICD-10-CM

## 2015-05-22 NOTE — Patient Instructions (Signed)
-   Will get x-rays of your right shoulder and left shoulder/elbow - Continue Tylenol twice daily for pain control - Can try hot compresses as well  General Instructions:   Please bring your medicines with you each time you come to clinic.  Medicines may include prescription medications, over-the-counter medications, herbal remedies, eye drops, vitamins, or other pills.   Progress Toward Treatment Goals:  Treatment Goal 12/01/2014  Blood pressure at goal    Self Care Goals & Plans:  Self Care Goal 05/22/2015  Manage my medications take my medicines as prescribed; bring my medications to every visit  Monitor my health -  Eat healthy foods drink diet soda or water instead of juice or soda; eat more vegetables; eat foods that are low in salt; eat baked foods instead of fried foods; eat fruit for snacks and desserts  Be physically active -    No flowsheet data found.   Care Management & Community Referrals:  No flowsheet data found.

## 2015-05-24 DIAGNOSIS — W19XXXA Unspecified fall, initial encounter: Secondary | ICD-10-CM | POA: Insufficient documentation

## 2015-05-24 DIAGNOSIS — Y92009 Unspecified place in unspecified non-institutional (private) residence as the place of occurrence of the external cause: Secondary | ICD-10-CM

## 2015-05-24 NOTE — Assessment & Plan Note (Signed)
I do not suspect that she has a fracture in any extremity, but she does have some tenderness on exam in her shoulders and L elbow. Will obtain x-ray of bilateral shoulders and left elbow.  - f/u x-rays>> no evidence of fracture - Recommended to continue Tylenol BID and use warm compresses for her pain

## 2015-05-24 NOTE — Progress Notes (Signed)
   Subjective:    Patient ID: Christina Burton, female    DOB: 1956/04/28, 59 y.o.   MRN: [id number]  HPI Christina Burton is a 59yo woman with PMHx of HTN, ulcerative colitis, and GERD who presents today after having a fall at home.  Patient reports on Saturday (10/15) she tripped over her vacuum cleaner and fell forward onto her left arm and right shoulder. She denies losing consciousness or hitting her head. She reports pain in her right shoulder, left shoulder, left elbow, and left knee. She notes the pain is mild and worse with movement. She has been taking Tylenol twice daily which alleviates her pain. She is worried that she may have broken a bone.   Review of Systems General: Denies fever, chills, night sweats, changes in weight, changes in appetite HEENT: Denies headaches, ear pain, changes in vision, rhinorrhea, sore throat CV: Denies CP, palpitations, SOB, orthopnea Pulm: Denies SOB, cough, wheezing GI: Denies abdominal pain, nausea, vomiting, diarrhea, constipation, melena, hematochezia GU: Denies dysuria, hematuria, frequency Msk: Denies muscle cramps Neuro: Denies weakness, numbness, tingling Skin: Denies rashes Psych: Denies depression, hallucinations    Objective:   Physical Exam General: alert, sitting up, NAD HEENT: [place]/AT, EOMI, sclera anicteric, mucus membranes moist CV: RRR, no m/g/r Pulm: CTA bilaterally, breaths non-labored Ext: There are two small skin abrasions on her left elbow. There is a small bruise present on her right shoulder and anterior left knee. She has minimal tenderness to palpation of her bilateral shoulders and left elbow. No tenderness to palpation of lower extremities. Her ROM is not limited in any extremity.  Neuro: alert and oriented x 3. Strength 5/5 in upper and lower extremities bilaterally.     Assessment & Plan:  Please refer to A&P documentation.

## 2015-05-25 NOTE — Progress Notes (Signed)
Internal Medicine Clinic Attending  Case discussed with Dr. Rivet soon after the resident saw the patient.  We reviewed the resident's history and exam and pertinent patient test results.  I agree with the assessment, diagnosis, and plan of care documented in the resident's note.  

## 2015-06-16 ENCOUNTER — Ambulatory Visit (INDEPENDENT_AMBULATORY_CARE_PROVIDER_SITE_OTHER): Payer: 59

## 2015-06-16 DIAGNOSIS — J309 Allergic rhinitis, unspecified: Secondary | ICD-10-CM

## 2015-06-19 ENCOUNTER — Other Ambulatory Visit: Payer: Self-pay | Admitting: Pulmonary Disease

## 2015-07-04 DIAGNOSIS — J3089 Other allergic rhinitis: Secondary | ICD-10-CM | POA: Diagnosis not present

## 2015-07-05 DIAGNOSIS — J301 Allergic rhinitis due to pollen: Secondary | ICD-10-CM | POA: Diagnosis not present

## 2015-07-09 ENCOUNTER — Ambulatory Visit (INDEPENDENT_AMBULATORY_CARE_PROVIDER_SITE_OTHER): Payer: 59 | Admitting: Pulmonary Disease

## 2015-07-09 ENCOUNTER — Encounter: Payer: Self-pay | Admitting: Pulmonary Disease

## 2015-07-09 VITALS — BP 131/72 | HR 72 | Temp 98.0°F | Ht 63.0 in | Wt 145.6 lb

## 2015-07-09 DIAGNOSIS — J309 Allergic rhinitis, unspecified: Secondary | ICD-10-CM | POA: Diagnosis not present

## 2015-07-09 DIAGNOSIS — I10 Essential (primary) hypertension: Secondary | ICD-10-CM

## 2015-07-09 DIAGNOSIS — M5432 Sciatica, left side: Secondary | ICD-10-CM

## 2015-07-09 NOTE — Patient Instructions (Signed)
General Instructions:   Thank you for bringing your medicines today. This helps [place] keep you safe from mistakes.   Progress Toward Treatment Goals:  Treatment Goal 07/09/2015  Blood pressure at goal  Prevent falls improved    Self Care Goals & Plans:  Self Care Goal 07/09/2015  Manage my medications bring my medications to every visit  Monitor my health keep track of my weight; keep track of my blood pressure  Eat healthy foods drink diet soda or water instead of juice or soda; eat more vegetables; eat foods that are low in salt; eat baked foods instead of fried foods; eat fruit for snacks and desserts  Be physically active find an activity I enjoy

## 2015-07-09 NOTE — Assessment & Plan Note (Signed)
BP Readings from Last 3 Encounters:  07/09/15 131/72  05/22/15 117/72  12/30/14 110/80    Lab Results  Component Value Date   NA 141 12/01/2014   K 3.3* 12/01/2014   CREATININE 0.83 12/01/2014    Assessment: Blood pressure control: controlled Progress toward BP goal:  at goal  Plan: Medications:  Continue HCTZ 18m daily. -Follow up in 6 months. Will recheck BMP at that time.

## 2015-07-09 NOTE — Progress Notes (Signed)
Subjective:    Patient ID: Christina Burton, female    DOB: 1955-11-29, 59 y.o.   MRN: [id number]  HPI Ms. Christina Burton is a 59 year old woman with history of HTN, ulcerative colitis, and GERD presenting for follow up of her hypertension.  She has no complaints today. She reports she is doing well overall.  Review of Systems Constitutional: no fevers/chills Eyes: no vision changes Ears, nose, mouth, throat, and face: no cough Respiratory: no shortness of breath Cardiovascular: no chest pain Gastrointestinal: no nausea/vomiting, no abdominal pain, no constipation, no diarrhea Genitourinary: no dysuria, no hematuria Integument: no rash Hematologic/lymphatic: no bleeding/bruising, no edema Musculoskeletal: no arthralgias, no myalgias Neurological: no paresthesias, no weakness  Past Medical History  Diagnosis Date  . Sinusitis     chronic  . Mitral valve prolapse   . Ulcerative colitis     sees Dr. Collene Mares - normal colonoscopy in Nov 2011  . Anxiety disorder   . Obsessive compulsive disorder   . Allergic rhinitis     Current Outpatient Prescriptions on File Prior to Visit  Medication Sig Dispense Refill  . acetaminophen (TYLENOL) 500 MG tablet Take 500 mg by mouth every 6 (six) hours as needed. For pain    . albuterol (PROVENTIL,VENTOLIN) 90 MCG/ACT inhaler Inhale 2 puffs into the lungs every 6 (six) hours as needed. For shortness of breath or wheezing     . Ascorbic Acid (VITAMIN C PO) Take 1 tablet by mouth daily.    Marland Kitchen azelastine (ASTELIN) 137 MCG/SPRAY nasal spray 1 spray by Nasal route daily. Use in each nostril as directed     . beclomethasone (QVAR) 80 MCG/ACT inhaler Inhale 2 puffs into the lungs daily.      . cetirizine (ZYRTEC) 10 MG tablet Take 10 mg by mouth daily.      Marland Kitchen gabapentin (NEURONTIN) 300 MG capsule Take 300 mg by mouth 3 (three) times daily as needed.    . hydrochlorothiazide (HYDRODIURIL) 25 MG tablet TAKE 1 TABLET BY MOUTH DAILY 90 tablet 4  .  mesalamine (CANASA) 1000 MG suppository Place 1,000 mg rectally daily as needed. Use as directed by your gastroenterologist for ulcerative colitis    . mesalamine (LIALDA) 1.2 G EC tablet Take 2.4 g by mouth daily.     . mometasone (NASONEX) 50 MCG/ACT nasal spray 1 spray by Nasal route daily.      . montelukast (SINGULAIR) 10 MG tablet Take 10 mg by mouth daily as needed.     . Multiple Vitamin (MULTIVITAMIN WITH MINERALS) TABS Take 1 tablet by mouth daily.    Marland Kitchen omeprazole (PRILOSEC) 20 MG capsule Take 40 mg by mouth daily.     Marland Kitchen PARoxetine (PAXIL) 20 MG tablet Take 2 tablets (40 mg total) by mouth daily. 180 tablet 4  . potassium chloride (K-DUR) 10 MEQ tablet Take 3 tablets (30 mEq total) by mouth daily. 90 tablet 2  . pravastatin (PRAVACHOL) 20 MG tablet TAKE 1 TABLET BY MOUTH EVERY MORNING 90 tablet 4   No current facility-administered medications on file prior to visit.    Today's Vitals   07/09/15 1324 07/09/15 1326  BP: 131/72   Pulse: 72   Temp: 98 F (36.7 C)   TempSrc: Oral   Height: 5' 3"  (1.6 m)   Weight: 145 lb 9.6 oz (66.044 kg)   SpO2: 100%   PainSc: 6  6   PainLoc: Back    Objective:   Physical Exam  Constitutional: She is oriented  to person, place, and time. She appears well-developed and well-nourished.  HENT:  Head: Normocephalic and atraumatic.  Eyes: Conjunctivae and EOM are normal.  Neck: Neck supple.  Cardiovascular: Normal rate and regular rhythm.   Pulmonary/Chest: Effort normal. She has no wheezes.  Abdominal: Soft. She exhibits no distension.  Musculoskeletal: She exhibits no edema.  Neurological: She is alert and oriented to person, place, and time.  Skin: Skin is warm and dry.  Psychiatric: She has a normal mood and affect.    Assessment & Plan:  Please refer to problem based charting.

## 2015-07-12 NOTE — Assessment & Plan Note (Signed)
Followed by allergist. She may need new referral orders placed, will follow up on whether these need to be ordered.

## 2015-07-12 NOTE — Assessment & Plan Note (Signed)
Followed by Orthopedics. She may need new referral orders placed, will follow up on whether these need to be ordered.

## 2015-07-15 ENCOUNTER — Ambulatory Visit (INDEPENDENT_AMBULATORY_CARE_PROVIDER_SITE_OTHER): Payer: 59

## 2015-07-15 DIAGNOSIS — J309 Allergic rhinitis, unspecified: Secondary | ICD-10-CM

## 2015-07-15 NOTE — Progress Notes (Signed)
Internal Medicine Clinic Attending  Case discussed with Dr. Krall at the time of the visit.  We reviewed the resident's history and exam and pertinent patient test results.  I agree with the assessment, diagnosis, and plan of care documented in the resident's note.  

## 2015-08-14 ENCOUNTER — Ambulatory Visit (INDEPENDENT_AMBULATORY_CARE_PROVIDER_SITE_OTHER): Payer: BLUE CROSS/BLUE SHIELD | Admitting: *Deleted

## 2015-08-14 DIAGNOSIS — J309 Allergic rhinitis, unspecified: Secondary | ICD-10-CM

## 2015-08-28 ENCOUNTER — Ambulatory Visit
Admission: RE | Admit: 2015-08-28 | Discharge: 2015-08-28 | Disposition: A | Discharge status: Home / Self Care | Payer: BLUE CROSS/BLUE SHIELD | Source: Ambulatory Visit | Attending: Family Medicine | Admitting: Family Medicine

## 2015-08-28 ENCOUNTER — Encounter: Payer: Self-pay | Admitting: Family Medicine

## 2015-08-28 ENCOUNTER — Ambulatory Visit (INDEPENDENT_AMBULATORY_CARE_PROVIDER_SITE_OTHER): Payer: BLUE CROSS/BLUE SHIELD | Admitting: Family Medicine

## 2015-08-28 VITALS — BP 134/88 | HR 81 | Ht 63.0 in | Wt 143.0 lb

## 2015-08-28 DIAGNOSIS — S93402A Sprain of unspecified ligament of left ankle, initial encounter: Secondary | ICD-10-CM | POA: Diagnosis not present

## 2015-08-28 DIAGNOSIS — M25572 Pain in left ankle and joints of left foot: Secondary | ICD-10-CM | POA: Diagnosis not present

## 2015-08-28 NOTE — Patient Instructions (Signed)
Wear your ankle brace or compression sleeve during exercises and any other activity where you are on your feet for several hours. Do not wear when you are asleep Continue to ice and elevate as needed.  We will call you with the xray results   Follow up as needed.

## 2015-08-30 NOTE — Progress Notes (Signed)
Christina Burton - 60 y.o. 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  Patient denies any previous hx of ankle injury/surgery.  States car entered the parking lot quickly and she stepped on the curb, inverting her ankle.  She did not fall. No previous medical attention.  She has been elevating it and icing, both of which help.  Mild swelling and bruising.  Most of her work is sitting so she has not needed accommodations.  She has not taken any medications.  She was able to walk on it shortly after the injury and continues to walk on it with mild pain.   ROS:     As above in HPI &  No fevers, chills, night sweats, weight loss. No chornic LE edema.  NO chest pain or shortness of breath.   HISTORY: Past Medical, Surgical, Social, and Family History Reviewed & Updated per EMR.  Pertinent Historical Findings include: HTN, GERD, DJD, RC tendinitis, Carpal Tunnel release hx, IBS, never smoker  OBJECTIVE: BP 134/88 mmHg  Pulse 81  Ht 5' 3"  (1.6 m)  Wt 143 lb (64.864 kg)  BMI 25.34 kg/m2  LMP 03/01/2010  Physical Exam  Calm, NAD Non-labored breathing  Ankle: left Mild dorsal midfoot and lateral ankle swelling.  Range of motion is full in all directions. Strength is 4+/5 in all directions, with the most limitation with eversion. Stable lateral and medial ligaments. + squeeze & kleiger (mildly) Talar dome nontender; Tender in multiple locations including the 5th MT, navicular, b/l malleoli (P>A).  Able to walk 4 steps. Able to stand on toes of left foot.   Ankle: right No visible erythema or swelling. Range of motion is full in all directions. Strength is 5/5 in all directions. Stable lateral and medial ligaments; squeeze test and kleiger test unremarkable; Talar dome nontender; No pain at base of 5th MT; No  tenderness over cuboid; No tenderness over N spot or navicular prominence No tenderness on posterior aspects of lateral and medial malleolus No sign of peroneal tendon subluxations; Negative tarsal tunnel tinel's Able to walk 4 steps.   MEDICATIONS, LABS & OTHER ORDERS: Previous Medications   ACETAMINOPHEN (TYLENOL) 500 MG TABLET    Take 500 mg by mouth every 6 (six) hours as needed. For pain   ALBUTEROL (PROVENTIL,VENTOLIN) 90 MCG/ACT INHALER    Inhale 2 puffs into the lungs every 6 (six) hours as needed. For shortness of breath or wheezing    ASCORBIC ACID (VITAMIN C PO)    Take 1 tablet by mouth daily.   AZELASTINE (ASTELIN) 137 MCG/SPRAY NASAL SPRAY    1 spray by Nasal route daily. Use in each nostril as directed    BECLOMETHASONE (QVAR) 80 MCG/ACT INHALER    Inhale 2 puffs into the lungs daily.     CETIRIZINE (ZYRTEC) 10 MG TABLET    Take 10 mg by mouth daily.     GABAPENTIN (NEURONTIN) 300 MG CAPSULE    Take 300 mg by mouth 3 (three) times daily as needed.   HYDROCHLOROTHIAZIDE (HYDRODIURIL) 25 MG TABLET    TAKE 1 TABLET BY MOUTH DAILY   MESALAMINE (CANASA) 1000 MG SUPPOSITORY    Place 1,000 mg rectally daily as needed. Use as directed by your gastroenterologist for ulcerative colitis   MESALAMINE (Brilliant)  1.2 G EC TABLET    Take 2.4 g by mouth daily.    MOMETASONE (NASONEX) 50 MCG/ACT NASAL SPRAY    1 spray by Nasal route daily.     MONTELUKAST (SINGULAIR) 10 MG TABLET    Take 10 mg by mouth daily as needed.    MULTIPLE VITAMIN (MULTIVITAMIN WITH MINERALS) TABS    Take 1 tablet by mouth daily.   OMEPRAZOLE (PRILOSEC) 20 MG CAPSULE    Take 40 mg by mouth daily.    PAROXETINE (PAXIL) 20 MG TABLET    Take 2 tablets (40 mg total) by mouth daily.   PRAVASTATIN (PRAVACHOL) 20 MG TABLET    TAKE 1 TABLET BY MOUTH EVERY MORNING   Modified Medications   No medications on file   New Prescriptions   No medications on file   Discontinued Medications   No medications on file   Orders  Placed This Encounter  Procedures  . DG Ankle Complete Left   ASSESSMENT & PLAN: Left ankle pain, likely lateral ankle sprain: Able to walk and stand on left foot only.  Tender in numerous locations with rather significant swelling and bruising.  Will get 3 view ankle and foot. Provided with ASO for PRN use.  OTC medications PRN. Given exercise handout.  F/u PRN unless XRs show a fracture.  Call with any questions.

## 2015-08-31 ENCOUNTER — Encounter: Payer: Self-pay | Admitting: Family Medicine

## 2015-08-31 ENCOUNTER — Telehealth: Payer: Self-pay | Admitting: Family Medicine

## 2015-08-31 DIAGNOSIS — S93409A Sprain of unspecified ligament of unspecified ankle, initial encounter: Secondary | ICD-10-CM | POA: Insufficient documentation

## 2015-08-31 NOTE — Telephone Encounter (Signed)
Spoke  With her via telephone regarding her ankle x-ray. Radiology read it as a small avulsion fracture off the talar neck. I looked at the film and I'm not sure that's acute; Not sure the cortex looked irregular. Either way, it wouldn't change our treatment plan. I explained this to her. She has gotten an over-the-counter brace and that seems to be helping. I urged her to go ahead and do the exercises as we had discussed. She'll follow-up when necessary. I'll send her a copy the report in the mail

## 2015-09-11 ENCOUNTER — Ambulatory Visit (INDEPENDENT_AMBULATORY_CARE_PROVIDER_SITE_OTHER): Payer: BLUE CROSS/BLUE SHIELD | Admitting: *Deleted

## 2015-09-11 DIAGNOSIS — J309 Allergic rhinitis, unspecified: Secondary | ICD-10-CM

## 2015-10-07 ENCOUNTER — Ambulatory Visit (INDEPENDENT_AMBULATORY_CARE_PROVIDER_SITE_OTHER): Payer: BLUE CROSS/BLUE SHIELD

## 2015-10-07 DIAGNOSIS — J309 Allergic rhinitis, unspecified: Secondary | ICD-10-CM | POA: Diagnosis not present

## 2015-11-03 DIAGNOSIS — M5137 Other intervertebral disc degeneration, lumbosacral region: Secondary | ICD-10-CM | POA: Diagnosis not present

## 2015-11-03 DIAGNOSIS — M4316 Spondylolisthesis, lumbar region: Secondary | ICD-10-CM | POA: Diagnosis not present

## 2015-11-06 ENCOUNTER — Ambulatory Visit (INDEPENDENT_AMBULATORY_CARE_PROVIDER_SITE_OTHER): Payer: BLUE CROSS/BLUE SHIELD | Admitting: *Deleted

## 2015-11-06 DIAGNOSIS — J309 Allergic rhinitis, unspecified: Secondary | ICD-10-CM | POA: Diagnosis not present

## 2015-11-30 ENCOUNTER — Ambulatory Visit (INDEPENDENT_AMBULATORY_CARE_PROVIDER_SITE_OTHER): Payer: BLUE CROSS/BLUE SHIELD

## 2015-11-30 DIAGNOSIS — J309 Allergic rhinitis, unspecified: Secondary | ICD-10-CM | POA: Diagnosis not present

## 2015-12-08 ENCOUNTER — Ambulatory Visit (INDEPENDENT_AMBULATORY_CARE_PROVIDER_SITE_OTHER): Payer: BLUE CROSS/BLUE SHIELD | Admitting: *Deleted

## 2015-12-08 DIAGNOSIS — J309 Allergic rhinitis, unspecified: Secondary | ICD-10-CM

## 2015-12-16 ENCOUNTER — Ambulatory Visit (INDEPENDENT_AMBULATORY_CARE_PROVIDER_SITE_OTHER): Payer: BLUE CROSS/BLUE SHIELD

## 2015-12-16 DIAGNOSIS — J309 Allergic rhinitis, unspecified: Secondary | ICD-10-CM

## 2015-12-18 ENCOUNTER — Encounter: Payer: Self-pay | Admitting: *Deleted

## 2015-12-22 ENCOUNTER — Ambulatory Visit (INDEPENDENT_AMBULATORY_CARE_PROVIDER_SITE_OTHER): Payer: BLUE CROSS/BLUE SHIELD | Admitting: Allergy and Immunology

## 2015-12-22 ENCOUNTER — Encounter: Payer: Self-pay | Admitting: Allergy and Immunology

## 2015-12-22 VITALS — HR 76 | Resp 18 | Ht 62.32 in | Wt 143.4 lb

## 2015-12-22 DIAGNOSIS — J387 Other diseases of larynx: Secondary | ICD-10-CM | POA: Diagnosis not present

## 2015-12-22 DIAGNOSIS — K219 Gastro-esophageal reflux disease without esophagitis: Secondary | ICD-10-CM

## 2015-12-22 DIAGNOSIS — H1010 Acute atopic conjunctivitis, unspecified eye: Secondary | ICD-10-CM | POA: Diagnosis not present

## 2015-12-22 DIAGNOSIS — J4530 Mild persistent asthma, uncomplicated: Secondary | ICD-10-CM

## 2015-12-22 DIAGNOSIS — G4720 Circadian rhythm sleep disorder, unspecified type: Secondary | ICD-10-CM

## 2015-12-22 DIAGNOSIS — G478 Other sleep disorders: Secondary | ICD-10-CM

## 2015-12-22 DIAGNOSIS — J309 Allergic rhinitis, unspecified: Secondary | ICD-10-CM

## 2015-12-22 MED ORDER — OMEPRAZOLE 20 MG PO CPDR: DELAYED_RELEASE_CAPSULE | ORAL | Status: DC

## 2015-12-22 MED ORDER — CYPROHEPTADINE HCL 4 MG PO TABS: ORAL_TABLET | ORAL | Status: DC

## 2015-12-22 MED ORDER — ALBUTEROL SULFATE HFA 108 (90 BASE) MCG/ACT IN AERS: INHALATION_SPRAY | RESPIRATORY_TRACT | Status: DC

## 2015-12-22 MED ORDER — BECLOMETHASONE DIPROPIONATE 80 MCG/ACT IN AERS: INHALATION_SPRAY | RESPIRATORY_TRACT | Status: DC

## 2015-12-22 MED ORDER — AZELASTINE HCL 0.1 % NA SOLN: NASAL | Status: DC

## 2015-12-22 MED ORDER — MOMETASONE FUROATE 50 MCG/ACT NA SUSP: NASAL | Status: DC

## 2015-12-22 NOTE — Patient Instructions (Addendum)
  1. Continue Qvar 80 2 inhalations twice a day  2. Continue Nasonex one spray each nostril two times per day  3. Continue montelukast 10 mg one tablet once a day  4. Continue immunotherapy and EpiPen  5. Continue omeprazole 20 mg twice a day  6. Start Cyproheptadine 68m one half to one tablet at bedtime  7. Continue nasal Astelin, Zyrtec, Proventil HFA, and Zaditor if needed  8. Obtain fall flu vaccine  9. Return to clinic in 1 year or earlier if problem

## 2015-12-22 NOTE — Progress Notes (Signed)
Follow-up Note  Referring Provider: Milagros Loll, MD Primary Provider: Jacques Earthly, MD Date of Office Visit: 12/22/2015  Subjective:   Christina Burton (DOB: [birth year]) is a 60 y.o. female who returns to the [facility] on 12/22/2015 in re-evaluation of the following:  HPI: [name] returns to this clinic in evaluation of her asthma and allergic rhinoconjunctivitis treated with immunotherapy and LPR. She's not been seen in his clinic since May 2016.  She's had an excellent year regarding her asthma without any exacerbations requiring a systemic steroid and a minimal requirement for short acting bronchodilator and no diminished exercise capacity as a result of this disease date. She does consistently use her Qvar.  She has not been having a tremendous amount of problems with her nose as long she continues to use some Nasonex. She is not required an antibiotic for a episode of sinusitis.  She continues on immunotherapy which appears to be going quite well. She's had no adverse effects secondary to this administration.  Her reflux is under very good control as long as she continues to use her omeprazole twice a day.  One new issue that [name] would like me to address is her sleep dysfunction. She has a long history of sleep dysfunction with inability to maintain sleep for which she's taking melatonin which is not working.    Medication List           acetaminophen 500 MG tablet  Commonly known as:  TYLENOL  Take 500 mg by mouth every 6 (six) hours as needed. For pain     albuterol 90 MCG/ACT inhaler  Commonly known as:  PROVENTIL,VENTOLIN  Inhale 2 puffs into the lungs every 6 (six) hours as needed. For shortness of breath or wheezing     ASTELIN 0.1 % nasal spray  Generic drug:  azelastine  1 spray by Nasal route daily. Use in each nostril as directed     beclomethasone 80 MCG/ACT inhaler  Commonly known as:  QVAR  Inhale 2 puffs into the lungs  daily.     CANASA 1000 MG suppository  Generic drug:  mesalamine  Place 1,000 mg rectally daily as needed. Use as directed by your gastroenterologist for ulcerative colitis     mesalamine 1.2 g EC tablet  Commonly known as:  LIALDA  Take 2.4 g by mouth daily.     cetirizine 10 MG tablet  Commonly known as:  ZYRTEC  Take 10 mg by mouth daily.     gabapentin 300 MG capsule  Commonly known as:  NEURONTIN  Take 300 mg by mouth 3 (three) times daily as needed.     hydrochlorothiazide 25 MG tablet  Commonly known as:  HYDRODIURIL  TAKE 1 TABLET BY MOUTH DAILY     mometasone 50 MCG/ACT nasal spray  Commonly known as:  NASONEX  1 spray by Nasal route daily.     montelukast 10 MG tablet  Commonly known as:  SINGULAIR  Take 10 mg by mouth daily as needed. Reported on 12/22/2015     multivitamin with minerals Tabs tablet  Take 1 tablet by mouth daily.     omeprazole 20 MG capsule  Commonly known as:  PRILOSEC  Take 20 mg by mouth 2 (two) times daily.     PARoxetine 20 MG tablet  Commonly known as:  PAXIL  Take 2 tablets (40 mg total) by mouth daily.     pravastatin 20 MG tablet  Commonly known as:  PRAVACHOL  TAKE 1 TABLET BY MOUTH EVERY MORNING     VITAMIN C PO  Take 1 tablet by mouth daily.        Past Medical History  Diagnosis Date  . Sinusitis     chronic  . Mitral valve prolapse   . Ulcerative colitis     sees Dr. Collene Mares - normal colonoscopy in Nov 2011  . Anxiety disorder   . Obsessive compulsive disorder   . Allergic rhinitis     Past Surgical History  Procedure Laterality Date  . Carpal tunnel release    . Hernia repair    . Trigger finger release      right thumb  . Tonsillectomy and adenoidectomy      Allergies  Allergen Reactions  . Amoxicillin Hives  . Articaine     Caused numbness that too long - lasted about 6 months when used by dentist  . Atenolol Hives  . Sulfonamide Derivatives     REACTION: Unknown reaction  . Trimox [Amoxicillin  Trihydrate]     Review of systems negative except as noted in HPI / PMHx or noted below:  Review of Systems  Constitutional: Negative.   HENT: Negative.   Eyes: Negative.   Respiratory: Negative.   Cardiovascular: Negative.   Gastrointestinal: Negative.   Genitourinary: Negative.   Musculoskeletal: Negative.   Skin: Negative.   Neurological: Negative.   Endo/Heme/Allergies: Negative.   Psychiatric/Behavioral: Negative.      Objective:   Filed Vitals:   12/22/15 1820  Pulse: 76  Resp: 18   Height: 5' 2.32" (158.3 cm)  Weight: 143 lb 6.4 oz (65.046 kg)   Physical Exam  Constitutional: She is well-developed, well-nourished, and in no distress.  HENT:  Head: Normocephalic.  Right Ear: Tympanic membrane, external ear and ear canal normal.  Left Ear: Tympanic membrane, external ear and ear canal normal.  Nose: Nose normal. No mucosal edema or rhinorrhea.  Mouth/Throat: Uvula is midline, oropharynx is clear and moist and mucous membranes are normal. No oropharyngeal exudate.  Eyes: Conjunctivae are normal.  Neck: Trachea normal. No tracheal tenderness present. No tracheal deviation present. No thyromegaly present.  Cardiovascular: Normal rate, regular rhythm, S1 normal, S2 normal and normal heart sounds.   No murmur heard. Pulmonary/Chest: Breath sounds normal. No stridor. No respiratory distress. She has no wheezes. She has no rales.  Musculoskeletal: She exhibits no edema.  Lymphadenopathy:       Head (right side): No tonsillar adenopathy present.       Head (left side): No tonsillar adenopathy present.    She has no cervical adenopathy.  Neurological: She is alert. Gait normal.  Skin: No rash noted. She is not diaphoretic. No erythema. Nails show no clubbing.  Psychiatric: Mood and affect normal.    Diagnostics:    Spirometry was performed and demonstrated an FEV1 of 2.34 at 102 % of predicted.  The patient had an Asthma Control Test with the following results:   .    Assessment and Plan:   1. Asthma, well controlled, mild persistent   2. Allergic rhinoconjunctivitis   3. LPRD (laryngopharyngeal reflux disease)   4. Dysfunction of sleep stage or arousal     1. Continue Qvar 80 2 inhalations twice a day  2. Continue Nasonex one spray each nostril two times per day  3. Continue montelukast 10 mg one tablet once a day  4. Continue immunotherapy and EpiPen  5. Continue omeprazole 20 mg twice a day  6.  Start Cyproheptadine 36m one half to one tablet at bedtime  7. Continue nasal Astelin, Zyrtec, Proventil HFA, and Zaditor if needed  8. Obtain fall flu vaccine  9. Return to clinic in 1 year or earlier if problem  NDarciaappears to be doing quite well at this point in time and we'll continue her on anti-inflammatory medications for both her upper and lower airways as noted above and is well addressed the issue of her reflux-induced respiratory disease with a proton pump inhibitor. An issue that will be addressed that is somewhat new is her sleep dysfunction. I'll start her on cyproheptadine at nighttime and she can report back to me about her response over the course of the next several weeks. If she does well I'll see her back in this clinic in 1 year or earlier if there is a problem.  EAllena Katz MD [place]

## 2015-12-31 ENCOUNTER — Ambulatory Visit (INDEPENDENT_AMBULATORY_CARE_PROVIDER_SITE_OTHER): Payer: BLUE CROSS/BLUE SHIELD

## 2015-12-31 DIAGNOSIS — J309 Allergic rhinitis, unspecified: Secondary | ICD-10-CM | POA: Diagnosis not present

## 2016-01-05 DIAGNOSIS — J3089 Other allergic rhinitis: Secondary | ICD-10-CM | POA: Diagnosis not present

## 2016-01-06 DIAGNOSIS — J301 Allergic rhinitis due to pollen: Secondary | ICD-10-CM | POA: Diagnosis not present

## 2016-01-12 DIAGNOSIS — R140 Abdominal distension (gaseous): Secondary | ICD-10-CM | POA: Diagnosis not present

## 2016-01-12 DIAGNOSIS — K219 Gastro-esophageal reflux disease without esophagitis: Secondary | ICD-10-CM | POA: Diagnosis not present

## 2016-01-12 DIAGNOSIS — K5190 Ulcerative colitis, unspecified, without complications: Secondary | ICD-10-CM | POA: Diagnosis not present

## 2016-01-27 ENCOUNTER — Ambulatory Visit (INDEPENDENT_AMBULATORY_CARE_PROVIDER_SITE_OTHER): Payer: BLUE CROSS/BLUE SHIELD

## 2016-01-27 DIAGNOSIS — J309 Allergic rhinitis, unspecified: Secondary | ICD-10-CM

## 2016-02-24 ENCOUNTER — Ambulatory Visit (INDEPENDENT_AMBULATORY_CARE_PROVIDER_SITE_OTHER): Payer: BLUE CROSS/BLUE SHIELD | Admitting: *Deleted

## 2016-02-24 DIAGNOSIS — J309 Allergic rhinitis, unspecified: Secondary | ICD-10-CM

## 2016-03-10 DIAGNOSIS — L821 Other seborrheic keratosis: Secondary | ICD-10-CM | POA: Diagnosis not present

## 2016-03-10 DIAGNOSIS — L738 Other specified follicular disorders: Secondary | ICD-10-CM | POA: Diagnosis not present

## 2016-03-10 DIAGNOSIS — D2261 Melanocytic nevi of right upper limb, including shoulder: Secondary | ICD-10-CM | POA: Diagnosis not present

## 2016-03-10 DIAGNOSIS — I788 Other diseases of capillaries: Secondary | ICD-10-CM | POA: Diagnosis not present

## 2016-03-21 ENCOUNTER — Ambulatory Visit (INDEPENDENT_AMBULATORY_CARE_PROVIDER_SITE_OTHER): Payer: BLUE CROSS/BLUE SHIELD

## 2016-03-21 DIAGNOSIS — J309 Allergic rhinitis, unspecified: Secondary | ICD-10-CM | POA: Diagnosis not present

## 2016-03-25 DIAGNOSIS — Z1151 Encounter for screening for human papillomavirus (HPV): Secondary | ICD-10-CM | POA: Diagnosis not present

## 2016-03-25 DIAGNOSIS — Z6825 Body mass index (BMI) 25.0-25.9, adult: Secondary | ICD-10-CM | POA: Diagnosis not present

## 2016-03-25 DIAGNOSIS — Z01419 Encounter for gynecological examination (general) (routine) without abnormal findings: Secondary | ICD-10-CM | POA: Diagnosis not present

## 2016-04-18 ENCOUNTER — Ambulatory Visit (INDEPENDENT_AMBULATORY_CARE_PROVIDER_SITE_OTHER): Payer: BLUE CROSS/BLUE SHIELD

## 2016-04-18 DIAGNOSIS — J309 Allergic rhinitis, unspecified: Secondary | ICD-10-CM | POA: Diagnosis not present

## 2016-05-10 ENCOUNTER — Telehealth: Payer: Self-pay

## 2016-05-16 ENCOUNTER — Ambulatory Visit (INDEPENDENT_AMBULATORY_CARE_PROVIDER_SITE_OTHER): Payer: BLUE CROSS/BLUE SHIELD | Admitting: *Deleted

## 2016-05-16 DIAGNOSIS — H1010 Acute atopic conjunctivitis, unspecified eye: Secondary | ICD-10-CM | POA: Diagnosis not present

## 2016-05-16 DIAGNOSIS — J309 Allergic rhinitis, unspecified: Secondary | ICD-10-CM

## 2016-05-26 DIAGNOSIS — M5137 Other intervertebral disc degeneration, lumbosacral region: Secondary | ICD-10-CM | POA: Diagnosis not present

## 2016-05-26 DIAGNOSIS — M4316 Spondylolisthesis, lumbar region: Secondary | ICD-10-CM | POA: Diagnosis not present

## 2016-06-03 NOTE — Telephone Encounter (Signed)
Unable to reach.

## 2016-06-13 ENCOUNTER — Ambulatory Visit (INDEPENDENT_AMBULATORY_CARE_PROVIDER_SITE_OTHER): Payer: BLUE CROSS/BLUE SHIELD | Admitting: *Deleted

## 2016-06-13 DIAGNOSIS — J309 Allergic rhinitis, unspecified: Secondary | ICD-10-CM

## 2016-06-16 DIAGNOSIS — M4316 Spondylolisthesis, lumbar region: Secondary | ICD-10-CM | POA: Diagnosis not present

## 2016-06-16 DIAGNOSIS — M545 Low back pain: Secondary | ICD-10-CM | POA: Diagnosis not present

## 2016-06-16 DIAGNOSIS — M5137 Other intervertebral disc degeneration, lumbosacral region: Secondary | ICD-10-CM | POA: Diagnosis not present

## 2016-06-18 DIAGNOSIS — Z23 Encounter for immunization: Secondary | ICD-10-CM | POA: Diagnosis not present

## 2016-06-20 ENCOUNTER — Other Ambulatory Visit: Payer: Self-pay | Admitting: Allergy and Immunology

## 2016-06-20 DIAGNOSIS — Z1231 Encounter for screening mammogram for malignant neoplasm of breast: Secondary | ICD-10-CM | POA: Diagnosis not present

## 2016-06-21 ENCOUNTER — Ambulatory Visit (INDEPENDENT_AMBULATORY_CARE_PROVIDER_SITE_OTHER): Payer: BLUE CROSS/BLUE SHIELD | Admitting: *Deleted

## 2016-06-21 DIAGNOSIS — J309 Allergic rhinitis, unspecified: Secondary | ICD-10-CM

## 2016-06-28 ENCOUNTER — Ambulatory Visit (INDEPENDENT_AMBULATORY_CARE_PROVIDER_SITE_OTHER): Payer: BLUE CROSS/BLUE SHIELD | Admitting: *Deleted

## 2016-06-28 DIAGNOSIS — J309 Allergic rhinitis, unspecified: Secondary | ICD-10-CM | POA: Diagnosis not present

## 2016-07-04 ENCOUNTER — Ambulatory Visit (INDEPENDENT_AMBULATORY_CARE_PROVIDER_SITE_OTHER): Payer: BLUE CROSS/BLUE SHIELD | Admitting: *Deleted

## 2016-07-04 DIAGNOSIS — J309 Allergic rhinitis, unspecified: Secondary | ICD-10-CM | POA: Diagnosis not present

## 2016-07-11 ENCOUNTER — Ambulatory Visit (INDEPENDENT_AMBULATORY_CARE_PROVIDER_SITE_OTHER): Payer: BLUE CROSS/BLUE SHIELD | Admitting: *Deleted

## 2016-07-11 DIAGNOSIS — J309 Allergic rhinitis, unspecified: Secondary | ICD-10-CM | POA: Diagnosis not present

## 2016-07-18 ENCOUNTER — Other Ambulatory Visit: Payer: Self-pay | Admitting: Pulmonary Disease

## 2016-07-18 NOTE — Telephone Encounter (Signed)
Last seen 1 year ago-scheduled with pcp on 07/21/2017

## 2016-07-19 DIAGNOSIS — M4316 Spondylolisthesis, lumbar region: Secondary | ICD-10-CM | POA: Diagnosis not present

## 2016-07-20 ENCOUNTER — Telehealth: Payer: Self-pay | Admitting: Pulmonary Disease

## 2016-07-20 NOTE — Telephone Encounter (Signed)
APT. REMINDER CALL, LMTCB °

## 2016-07-21 ENCOUNTER — Encounter: Payer: Self-pay | Admitting: Pulmonary Disease

## 2016-07-21 ENCOUNTER — Ambulatory Visit (INDEPENDENT_AMBULATORY_CARE_PROVIDER_SITE_OTHER): Payer: BLUE CROSS/BLUE SHIELD | Admitting: Pulmonary Disease

## 2016-07-21 VITALS — BP 108/64 | HR 76 | Temp 97.8°F | Ht 63.0 in | Wt 143.1 lb

## 2016-07-21 DIAGNOSIS — R413 Other amnesia: Secondary | ICD-10-CM | POA: Diagnosis not present

## 2016-07-21 DIAGNOSIS — F422 Mixed obsessional thoughts and acts: Secondary | ICD-10-CM

## 2016-07-21 DIAGNOSIS — F429 Obsessive-compulsive disorder, unspecified: Secondary | ICD-10-CM | POA: Diagnosis not present

## 2016-07-21 DIAGNOSIS — E876 Hypokalemia: Secondary | ICD-10-CM | POA: Diagnosis not present

## 2016-07-21 DIAGNOSIS — Z79899 Other long term (current) drug therapy: Secondary | ICD-10-CM

## 2016-07-21 DIAGNOSIS — I10 Essential (primary) hypertension: Secondary | ICD-10-CM | POA: Diagnosis not present

## 2016-07-21 MED ORDER — PRAVASTATIN SODIUM 20 MG PO TABS: 20.0000 mg | ORAL_TABLET | Freq: Every morning | ORAL | 4 refills | Status: DC

## 2016-07-21 MED ORDER — PAROXETINE HCL 20 MG PO TABS: 40.0000 mg | ORAL_TABLET | Freq: Every day | ORAL | 4 refills | Status: DC

## 2016-07-21 MED ORDER — HYDROCHLOROTHIAZIDE 25 MG PO TABS: 25.0000 mg | ORAL_TABLET | Freq: Every day | ORAL | 4 refills | Status: DC

## 2016-07-21 NOTE — Patient Instructions (Signed)
Follow up in 6 months 

## 2016-07-21 NOTE — Progress Notes (Signed)
   CC: Hypertension follow up  HPI:  [name] is a 60 y.o. woman with hypertension, allergic rhinitis, mild intermittent asthma, UC, GERD, sciatica, OCD presenting for follow up of hypertension.  She is doing well and has no complaints. She has had some memory problems and would like to have her vitamin B12 checked.   Past Medical History:  Diagnosis Date  . Allergic rhinitis   . Anxiety disorder   . Mitral valve prolapse   . Obsessive compulsive disorder   . Sinusitis    chronic  . Ulcerative colitis    sees Dr. Collene Mares - normal colonoscopy in Nov 2011    Review of Systems:   No fevers or chills No nausea/vomiting No paresthesias  Physical Exam:  Vitals:   07/21/16 1541  BP: 108/64  Pulse: 76  Temp: 97.8 F (36.6 C)  TempSrc: Oral  SpO2: 96%  Weight: 143 lb 1.6 oz (64.9 kg)  Height: 5' 3"  (1.6 m)   General Apperance: NAD HEENT: Normocephalic, atraumatic, anicteric sclera Neck: Supple, trachea midline Lungs: Clear to auscultation bilaterally. No wheezes, rhonchi or rales.  Heart: Regular rate and rhythm, no murmur/rub/gallop Abdomen: Soft, nontender, nondistended, no rebound/guarding Extremities: Warm and well perfused, no edema Skin: No rashes or lesions Neurologic: Alert and interactive. No gross deficits.  Assessment & Plan:   See Encounters Tab for problem based charting.  Patient discussed with Dr. Daryll Drown

## 2016-07-22 ENCOUNTER — Encounter: Payer: Self-pay | Admitting: Pulmonary Disease

## 2016-07-22 DIAGNOSIS — R413 Other amnesia: Secondary | ICD-10-CM | POA: Insufficient documentation

## 2016-07-22 MED ORDER — HYDROCHLOROTHIAZIDE 25 MG PO TABS: 12.5000 mg | ORAL_TABLET | Freq: Every day | ORAL | 4 refills | Status: DC

## 2016-07-22 NOTE — Assessment & Plan Note (Signed)
Refilled Paxil

## 2016-07-22 NOTE — Assessment & Plan Note (Signed)
Assessment: BP 108/64, controlled. Potassium 3.1 and renal function normal.  Plan: Change HCTZ to 12.69m daily as this may be contributing to her hypokalemia

## 2016-07-22 NOTE — Assessment & Plan Note (Signed)
She lives independently and still works at [employer]. Checked vitamin B12 which was within normal limits. CBC normal. TSH pending.  Check mini cog at next visit

## 2016-07-23 LAB — CBC
HEMATOCRIT: 40.9 % (ref 34.0–46.6)
HEMOGLOBIN: 14.3 g/dL (ref 11.1–15.9)
MCH: 29.7 pg (ref 26.6–33.0)
MCHC: 35 g/dL (ref 31.5–35.7)
MCV: 85 fL (ref 79–97)
Platelets: 230 10*3/uL (ref 150–379)
RBC: 4.81 x10E6/uL (ref 3.77–5.28)
RDW: 13.8 % (ref 12.3–15.4)
WBC: 9.2 10*3/uL (ref 3.4–10.8)

## 2016-07-23 LAB — BMP8+ANION GAP
ANION GAP: 16 mmol/L (ref 10.0–18.0)
BUN/Creatinine Ratio: 38 — ABNORMAL HIGH (ref 9–23)
BUN: 24 mg/dL (ref 6–24)
CALCIUM: 9.5 mg/dL (ref 8.7–10.2)
CO2: 29 mmol/L (ref 18–29)
CREATININE: 0.63 mg/dL (ref 0.57–1.00)
Chloride: 99 mmol/L (ref 96–106)
GFR calc Af Amer: 114 mL/min/{1.73_m2} (ref >59)
GFR, EST NON AFRICAN AMERICAN: 99 mL/min/{1.73_m2} (ref >59)
Glucose: 100 mg/dL — ABNORMAL HIGH (ref 65–99)
Potassium: 3.1 mmol/L — ABNORMAL LOW (ref 3.5–5.2)
Sodium: 144 mmol/L (ref 134–144)

## 2016-07-23 LAB — TSH: TSH: 4.3 u[IU]/mL (ref 0.450–4.500)

## 2016-07-23 LAB — VITAMIN B12: VITAMIN B 12: 827 pg/mL (ref 232–1245)

## 2016-07-27 ENCOUNTER — Ambulatory Visit (INDEPENDENT_AMBULATORY_CARE_PROVIDER_SITE_OTHER): Payer: BLUE CROSS/BLUE SHIELD

## 2016-07-27 DIAGNOSIS — J309 Allergic rhinitis, unspecified: Secondary | ICD-10-CM | POA: Diagnosis not present

## 2016-07-27 NOTE — Progress Notes (Signed)
Internal Medicine Clinic Attending  Case discussed with Dr. Krall soon after the resident saw the patient.  We reviewed the resident's history and exam and pertinent patient test results.  I agree with the assessment, diagnosis, and plan of care documented in the resident's note. 

## 2016-08-04 DIAGNOSIS — M4316 Spondylolisthesis, lumbar region: Secondary | ICD-10-CM | POA: Diagnosis not present

## 2016-08-04 DIAGNOSIS — M5416 Radiculopathy, lumbar region: Secondary | ICD-10-CM | POA: Diagnosis not present

## 2016-08-04 DIAGNOSIS — M5137 Other intervertebral disc degeneration, lumbosacral region: Secondary | ICD-10-CM | POA: Diagnosis not present

## 2016-08-11 NOTE — Addendum Note (Signed)
Addended by: Felipa Emory on: 08/11/2016 02:34 PM   Modules accepted: Orders

## 2016-08-12 DIAGNOSIS — M5416 Radiculopathy, lumbar region: Secondary | ICD-10-CM | POA: Diagnosis not present

## 2016-08-23 ENCOUNTER — Ambulatory Visit (INDEPENDENT_AMBULATORY_CARE_PROVIDER_SITE_OTHER): Payer: Self-pay | Admitting: *Deleted

## 2016-08-23 DIAGNOSIS — J309 Allergic rhinitis, unspecified: Secondary | ICD-10-CM

## 2016-08-23 DIAGNOSIS — M5137 Other intervertebral disc degeneration, lumbosacral region: Secondary | ICD-10-CM | POA: Diagnosis not present

## 2016-08-23 DIAGNOSIS — M4316 Spondylolisthesis, lumbar region: Secondary | ICD-10-CM | POA: Diagnosis not present

## 2016-08-23 DIAGNOSIS — M5416 Radiculopathy, lumbar region: Secondary | ICD-10-CM | POA: Diagnosis not present

## 2016-09-07 DIAGNOSIS — M4316 Spondylolisthesis, lumbar region: Secondary | ICD-10-CM | POA: Diagnosis not present

## 2016-09-20 ENCOUNTER — Ambulatory Visit: Payer: Self-pay | Admitting: *Deleted

## 2016-09-20 ENCOUNTER — Ambulatory Visit (INDEPENDENT_AMBULATORY_CARE_PROVIDER_SITE_OTHER): Payer: BLUE CROSS/BLUE SHIELD | Admitting: Allergy and Immunology

## 2016-09-20 ENCOUNTER — Encounter: Payer: Self-pay | Admitting: Allergy and Immunology

## 2016-09-20 VITALS — BP 112/70 | HR 76 | Temp 98.2°F | Resp 18 | Ht 62.75 in | Wt 141.2 lb

## 2016-09-20 DIAGNOSIS — J4530 Mild persistent asthma, uncomplicated: Secondary | ICD-10-CM | POA: Diagnosis not present

## 2016-09-20 DIAGNOSIS — K219 Gastro-esophageal reflux disease without esophagitis: Secondary | ICD-10-CM | POA: Diagnosis not present

## 2016-09-20 DIAGNOSIS — J40 Bronchitis, not specified as acute or chronic: Secondary | ICD-10-CM | POA: Diagnosis not present

## 2016-09-20 DIAGNOSIS — J309 Allergic rhinitis, unspecified: Secondary | ICD-10-CM

## 2016-09-20 NOTE — Assessment & Plan Note (Signed)
Well-controlled.  For now, continue Qvar 80 g, one inhalation daily.  During respiratory tract infections or asthma flares, increase Qvar 80 g to 2 inhalations 2 times per day until symptoms have returned to baseline.  To maximize pulmonary deposition, a spacer has been provided along with instructions for its proper administration with an HFA inhaler.  Subjective and objective measures of pulmonary function will be followed and the treatment plan will be adjusted accordingly.

## 2016-09-20 NOTE — Assessment & Plan Note (Signed)
   Continue appropriate allergen avoidance measures and aeroallergen immunotherapy as prescribed and as tolerated.  Azelastine nasal spray, 2 sprays per nostril twice daily.  Nasal saline lavage (NeilMed) has been recommended prior to medicated nasal sprays and as needed along with instructions for proper administration.  For thick post nasal drainage, add guaifenesin [phone] mg (Mucinex)  twice daily as needed with adequate hydration as discussed.

## 2016-09-20 NOTE — Progress Notes (Signed)
Follow-up Note  RE: OTILIA KAREEM MRN: [id number] DOB: January 13, 1956 Date of Office Visit: 09/20/2016  Primary care provider: Jacques Earthly, MD Referring provider: Milagros Loll, MD  History of present illness: Mirha Brucato is a 61 y.o. female with persistent asthma, allergic rhinoconjunctivitis, and laryngopharyngeal reflux.  She was last seen in this clinic in May 2017.  She reports that in the interval since her previous visit her asthma has been well controlled with Qvar 80 g, 1-2 inhalations daily.  She does not use a spacer device with her HFA inhalers.  She has only required albuterol rescue once every 1 or 2 months over the past few months and does not experience nocturnal awakenings due to lower respiratory symptoms.  Over the past week, she has been experiencing some increased rhinorrhea, sneezing, postnasal drainage, and mild sinus pressure.  Denies fevers, chills, or discolored mucus production.   Assessment and plan: Mild persistent asthma Well-controlled.  For now, continue Qvar 80 g, one inhalation daily.  During respiratory tract infections or asthma flares, increase Qvar 80 g to 2 inhalations 2 times per day until symptoms have returned to baseline.  To maximize pulmonary deposition, a spacer has been provided along with instructions for its proper administration with an HFA inhaler.  Subjective and objective measures of pulmonary function will be followed and the treatment plan will be adjusted accordingly.  Allergic rhinitis  Continue appropriate allergen avoidance measures and aeroallergen immunotherapy as prescribed and as tolerated.  Azelastine nasal spray, 2 sprays per nostril twice daily.  Nasal saline lavage (NeilMed) has been recommended prior to medicated nasal sprays and as needed along with instructions for proper administration.  For thick post nasal drainage, add guaifenesin [phone] mg (Mucinex)  twice daily as needed with adequate  hydration as discussed.  GERD (gastroesophageal reflux disease)  Continue appropriate reflux lifestyle modifications and omeprazole 20 mg daily.   Diagnostics: Spirometry:  Normal with an FEV1 of 92% predicted.  Please see scanned spirometry results for details.    Physical examination: Blood pressure 112/70, pulse 76, temperature 98.2 F (36.8 C), temperature source Oral, resp. rate 18, height 5' 2.75" (1.594 m), weight 141 lb 3.2 oz (64 kg), last menstrual period 03/01/2010, SpO2 96 %.  General: Alert, interactive, in no acute distress. HEENT: TMs pearly gray, turbinates moderately edematous with clear discharge, post-pharynx moderately erythematous. Neck: Supple without lymphadenopathy. Lungs: Clear to auscultation without wheezing, rhonchi or rales. CV: Normal S1, S2 without murmurs. Skin: Warm and dry, without lesions or rashes.  The following portions of the patient's history were reviewed and updated as appropriate: allergies, current medications, past family history, past medical history, past social history, past surgical history and problem list.  Allergies as of 09/20/2016      Reactions   Amoxicillin Hives   Articaine    Caused numbness that too long - lasted about 6 months when used by dentist   Atenolol Hives   Sulfonamide Derivatives    REACTION: Unknown reaction   Trimox [amoxicillin Trihydrate]       Medication List       Accurate as of 09/20/16  8:34 PM. Always use your most recent med list.          acetaminophen 500 MG tablet Commonly known as:  TYLENOL Take 500 mg by mouth every 6 (six) hours as needed. For pain   albuterol 108 (90 Base) MCG/ACT inhaler Commonly known as:  PROVENTIL HFA;VENTOLIN HFA INHALE TWO PUFFS EVERY 4-6 HOURS IF  NEEDED FOR COUGH OR WHEEZE   albuterol 90 MCG/ACT inhaler Commonly known as:  PROVENTIL,VENTOLIN Inhale 2 puffs into the lungs every 6 (six) hours as needed. For shortness of breath or wheezing   azelastine 0.1 %  nasal spray Commonly known as:  ASTELIN USE ONE SPRAY IN EACH NOSTRIL TWICE DAILY IF NEEDED   beclomethasone 80 MCG/ACT inhaler Commonly known as:  QVAR INHALE TWO PUFFS TWICE DAILY TO PREVENT COUGH OR WHEEZE   CALCIUM PO Take by mouth daily.   CANASA 1000 MG suppository Generic drug:  mesalamine Place 1,000 mg rectally daily as needed. Use as directed by your gastroenterologist for ulcerative colitis   mesalamine 1.2 g EC tablet Commonly known as:  LIALDA Take 2.4 g by mouth daily.   cetirizine 10 MG tablet Commonly known as:  ZYRTEC Take 10 mg by mouth daily.   cyproheptadine 4 MG tablet Commonly known as:  PERIACTIN TAKE ONE-HALF TO ONE TABLET AT BEDTIME   gabapentin 300 MG capsule Commonly known as:  NEURONTIN Take 300 mg by mouth 3 (three) times daily as needed.   hydrochlorothiazide 25 MG tablet Commonly known as:  HYDRODIURIL Take 0.5 tablets (12.5 mg total) by mouth daily.   mometasone 50 MCG/ACT nasal spray Commonly known as:  NASONEX USE ONE SPRAY IN EACH NOSTRIL TWICE DAILY   montelukast 10 MG tablet Commonly known as:  SINGULAIR Take 10 mg by mouth daily as needed. Reported on 12/22/2015   multivitamin with minerals Tabs tablet Take 1 tablet by mouth daily.   omeprazole 20 MG capsule Commonly known as:  PRILOSEC take 1 capsule by mouth twice a day   PARoxetine 20 MG tablet Commonly known as:  PAXIL Take 2 tablets (40 mg total) by mouth daily.   pravastatin 20 MG tablet Commonly known as:  PRAVACHOL Take 1 tablet (20 mg total) by mouth every morning.   VITAMIN C PO Take 1 tablet by mouth daily.   VITAMIN D PO Take by mouth daily.       Allergies  Allergen Reactions  . Amoxicillin Hives  . Articaine     Caused numbness that too long - lasted about 6 months when used by dentist  . Atenolol Hives  . Sulfonamide Derivatives     REACTION: Unknown reaction  . Trimox [Amoxicillin Trihydrate]    Review of systems: Review of systems  negative except as noted in HPI / PMHx or noted below: Constitutional: Negative.  HENT: Negative.   Eyes: Negative.  Respiratory: Negative.   Cardiovascular: Negative.  Gastrointestinal: Negative.  Genitourinary: Negative.  Musculoskeletal: Negative.  Neurological: Negative.  Endo/Heme/Allergies: Negative.  Cutaneous: Negative.  Past Medical History:  Diagnosis Date  . Allergic rhinitis   . Anxiety disorder   . Hypertension 04/24/2007   Qualifier: Diagnosis of  Problem Stop Reason:  By: Jerilee Hoh MD, Olam Idler    . Mitral valve prolapse   . Obsessive compulsive disorder   . Sinusitis    chronic  . Ulcerative colitis    sees Dr. Collene Mares - normal colonoscopy in Nov 2011    Family History  Problem Relation Age of Onset  . Hypertension Mother   . Hyperlipidemia Mother   . Asthma Mother   . Eczema Mother   . Diabetes Brother   . Asthma Brother   . Lymphoma Father   . Seizures Sister   . Asthma Sister   . Diabetes Maternal Grandmother   . Allergic rhinitis Neg Hx   . Angioedema Neg Hx   .  Immunodeficiency Neg Hx     Social History   Social History  . Marital status: Single    Spouse name: N/A  . Number of children: N/A  . Years of education: 20   Occupational History  . SECRETARY, clerical, Psychologist, occupational   Social History Main Topics  . Smoking status: Never Smoker  . Smokeless tobacco: Never Used  . Alcohol use No  . Drug use: No  . Sexual activity: No   Other Topics Concern  . Not on file   Social History Narrative   Financial assistance approved for 85% discount at [facility] and has [facility] card per Bonna Gains   02/16/2010          I appreciate the opportunity to take part in Younique's care. Please do not hesitate to contact me with questions.  Sincerely,   R. Edgar Frisk, MD

## 2016-09-20 NOTE — Assessment & Plan Note (Signed)
   Continue appropriate reflux lifestyle modifications and omeprazole 20 mg daily.

## 2016-09-20 NOTE — Patient Instructions (Addendum)
Mild persistent asthma Well-controlled.  For now, continue Qvar 80 g, one inhalation daily.  During respiratory tract infections or asthma flares, increase Qvar 80 g to 2 inhalations 2 times per day until symptoms have returned to baseline.  To maximize pulmonary deposition, a spacer has been provided along with instructions for its proper administration with an HFA inhaler.  Subjective and objective measures of pulmonary function will be followed and the treatment plan will be adjusted accordingly.  Allergic rhinitis  Continue appropriate allergen avoidance measures and aeroallergen immunotherapy as prescribed and as tolerated.  Azelastine nasal spray, 2 sprays per nostril twice daily.  Nasal saline lavage (NeilMed) has been recommended prior to medicated nasal sprays and as needed along with instructions for proper administration.  For thick post nasal drainage, add guaifenesin [phone] mg (Mucinex)  twice daily as needed with adequate hydration as discussed.  GERD (gastroesophageal reflux disease)  Continue appropriate reflux lifestyle modifications and omeprazole 20 mg daily.   Return in about 5 months (around 02/17/2017), or if symptoms worsen or fail to improve.

## 2016-09-23 DIAGNOSIS — M4316 Spondylolisthesis, lumbar region: Secondary | ICD-10-CM | POA: Diagnosis not present

## 2016-10-06 DIAGNOSIS — J3089 Other allergic rhinitis: Secondary | ICD-10-CM | POA: Diagnosis not present

## 2016-10-07 DIAGNOSIS — J301 Allergic rhinitis due to pollen: Secondary | ICD-10-CM | POA: Diagnosis not present

## 2016-10-17 ENCOUNTER — Ambulatory Visit (INDEPENDENT_AMBULATORY_CARE_PROVIDER_SITE_OTHER): Payer: BLUE CROSS/BLUE SHIELD

## 2016-10-17 DIAGNOSIS — J309 Allergic rhinitis, unspecified: Secondary | ICD-10-CM | POA: Diagnosis not present

## 2016-11-15 ENCOUNTER — Ambulatory Visit (INDEPENDENT_AMBULATORY_CARE_PROVIDER_SITE_OTHER): Payer: BLUE CROSS/BLUE SHIELD | Admitting: *Deleted

## 2016-11-15 DIAGNOSIS — J309 Allergic rhinitis, unspecified: Secondary | ICD-10-CM

## 2016-12-13 ENCOUNTER — Ambulatory Visit (INDEPENDENT_AMBULATORY_CARE_PROVIDER_SITE_OTHER): Payer: BLUE CROSS/BLUE SHIELD | Admitting: *Deleted

## 2016-12-13 DIAGNOSIS — J309 Allergic rhinitis, unspecified: Secondary | ICD-10-CM | POA: Diagnosis not present

## 2017-01-10 ENCOUNTER — Ambulatory Visit (INDEPENDENT_AMBULATORY_CARE_PROVIDER_SITE_OTHER): Payer: BLUE CROSS/BLUE SHIELD | Admitting: *Deleted

## 2017-01-10 DIAGNOSIS — J309 Allergic rhinitis, unspecified: Secondary | ICD-10-CM

## 2017-01-12 ENCOUNTER — Encounter: Payer: Self-pay | Admitting: *Deleted

## 2017-01-16 ENCOUNTER — Ambulatory Visit (INDEPENDENT_AMBULATORY_CARE_PROVIDER_SITE_OTHER): Payer: BLUE CROSS/BLUE SHIELD | Admitting: *Deleted

## 2017-01-16 ENCOUNTER — Other Ambulatory Visit: Payer: Self-pay | Admitting: Allergy and Immunology

## 2017-01-16 DIAGNOSIS — J309 Allergic rhinitis, unspecified: Secondary | ICD-10-CM

## 2017-01-19 ENCOUNTER — Encounter: Payer: Self-pay | Admitting: Pulmonary Disease

## 2017-01-19 ENCOUNTER — Ambulatory Visit (INDEPENDENT_AMBULATORY_CARE_PROVIDER_SITE_OTHER): Payer: BLUE CROSS/BLUE SHIELD | Admitting: Pulmonary Disease

## 2017-01-19 VITALS — BP 118/72 | HR 78 | Temp 97.4°F | Ht 63.0 in | Wt 141.5 lb

## 2017-01-19 DIAGNOSIS — E785 Hyperlipidemia, unspecified: Secondary | ICD-10-CM

## 2017-01-19 DIAGNOSIS — F429 Obsessive-compulsive disorder, unspecified: Secondary | ICD-10-CM | POA: Diagnosis not present

## 2017-01-19 DIAGNOSIS — R413 Other amnesia: Secondary | ICD-10-CM | POA: Diagnosis not present

## 2017-01-19 DIAGNOSIS — G4700 Insomnia, unspecified: Secondary | ICD-10-CM

## 2017-01-19 DIAGNOSIS — E782 Mixed hyperlipidemia: Secondary | ICD-10-CM | POA: Diagnosis not present

## 2017-01-19 DIAGNOSIS — J4520 Mild intermittent asthma, uncomplicated: Secondary | ICD-10-CM

## 2017-01-19 DIAGNOSIS — K5190 Ulcerative colitis, unspecified, without complications: Secondary | ICD-10-CM

## 2017-01-19 DIAGNOSIS — J309 Allergic rhinitis, unspecified: Secondary | ICD-10-CM

## 2017-01-19 DIAGNOSIS — M5430 Sciatica, unspecified side: Secondary | ICD-10-CM

## 2017-01-19 DIAGNOSIS — Z8249 Family history of ischemic heart disease and other diseases of the circulatory system: Secondary | ICD-10-CM

## 2017-01-19 DIAGNOSIS — Z79899 Other long term (current) drug therapy: Secondary | ICD-10-CM

## 2017-01-19 DIAGNOSIS — I10 Essential (primary) hypertension: Secondary | ICD-10-CM

## 2017-01-19 NOTE — Assessment & Plan Note (Signed)
Mini cog today was 4/5 (missed one of three word recall). Discussed how to improve memory with doing puzzles and exercises for her memory.

## 2017-01-19 NOTE — Patient Instructions (Signed)
Try doing brain exercises to help with your memory Follow up in 6-12 months  What increases the risk? Risk factors for insomnia include:  Gender. Women are more commonly affected than men.  Age. Insomnia is more common as you get older.  Stress. This may involve your professional or personal life.  Income. Insomnia is more common in people with lower income.  Lack of exercise.  Irregular work schedule or night shifts.  Traveling between different time zones.  Follow these instructions at home:  Take medicines only as directed by your health care provider.  Keep regular sleeping and waking hours. Avoid naps.  Keep a sleep diary to help you and your health care provider figure out what could be causing your insomnia. Include: ? When you sleep. ? When you wake up during the night. ? How well you sleep. ? How rested you feel the next day. ? Any side effects of medicines you are taking. ? What you eat and drink.  Make your bedroom a comfortable place where it is easy to fall asleep: ? Put up shades or special blackout curtains to block light from outside. ? Use a white noise machine to block noise. ? Keep the temperature cool.  Exercise regularly as directed by your health care provider. Avoid exercising right before bedtime.  Use relaxation techniques to manage stress. Ask your health care provider to suggest some techniques that may work well for you. These may include: ? Breathing exercises. ? Routines to release muscle tension. ? Visualizing peaceful scenes.  Cut back on alcohol, caffeinated beverages, and cigarettes, especially close to bedtime. These can disrupt your sleep.  Do not overeat or eat spicy foods right before bedtime. This can lead to digestive discomfort that can make it hard for you to sleep.  Limit screen use before bedtime. This includes: ? Watching TV. ? Using your smartphone, tablet, and computer.  Stick to a routine. This can help you fall  asleep faster. Try to do a quiet activity, brush your teeth, and go to bed at the same time each night.  Get out of bed if you are still awake after 15 minutes of trying to sleep. Keep the lights down, but try reading or doing a quiet activity. When you feel sleepy, go back to bed.  Make sure that you drive carefully. Avoid driving if you feel very sleepy.  Keep all follow-up appointments as directed by your health care provider. This is important. Contact a health care provider if:  You are tired throughout the day or have trouble in your daily routine due to sleepiness.  You continue to have sleep problems or your sleep problems get worse. Get help right away if:  You have serious thoughts about hurting yourself or someone else. This information is not intended to replace advice given to you by your health care provider. Make sure you discuss any questions you have with your health care provider. Document Released: 07/15/2000 Document Revised: 12/18/2015 Document Reviewed: 04/18/2014 Elsevier Interactive Patient Education  [employer].

## 2017-01-19 NOTE — Assessment & Plan Note (Signed)
Insomnia which is mostly controlled by melatonin but occasionally has nights where she sleeps very little. May be exacerbated by her mother's health problems recently. Discussed sleep hygiene techniques.

## 2017-01-19 NOTE — Assessment & Plan Note (Signed)
Discussed with her cardiac risk factors as she is worried with her mother having CAD, aortic valve disease. Discussed with her risk factor modification. Recheck lipid panel today

## 2017-01-19 NOTE — Progress Notes (Signed)
Internal Medicine Clinic Attending  Case discussed with Dr. Krall at the time of the visit.  We reviewed the resident's history and exam and pertinent patient test results.  I agree with the assessment, diagnosis, and plan of care documented in the resident's note.  

## 2017-01-19 NOTE — Assessment & Plan Note (Signed)
Assessment: BP controlled today at 118/72.   Plan: Continue HCTZ 12.56m daily. She is nervous about changing or coming off of BP medication.   Recheck BMP today

## 2017-01-19 NOTE — Progress Notes (Signed)
   CC: hypertension follow up  HPI:  Christina Burton is a 61 y.o. woman with hypertension, allergic rhinitis, mild intermittent asthma, UC, GERD, sciatica, OCD presenting for follow up of hypertension.  She is doing well overall. She has been under stress recently with her mother's health problems - she is having a cardiac evaluation and being considered for aortic valve replacement.  Past Medical History:  Diagnosis Date  . Allergic rhinitis   . Anxiety disorder   . Hypertension 04/24/2007   Qualifier: Diagnosis of  Problem Stop Reason:  By: Jerilee Hoh MD, Olam Idler    . Mitral valve prolapse   . Obsessive compulsive disorder   . Sinusitis    chronic  . Ulcerative colitis    sees Dr. Collene Mares - normal colonoscopy in Nov 2011    Review of Systems:   No chest pain No dyspnea  Physical Exam:  Vitals:   01/19/17 1349  BP: 118/72  Pulse: 78  Temp: 97.4 F (36.3 C)  TempSrc: Oral  SpO2: 94%  Weight: 141 lb 8 oz (64.2 kg)  Height: 5' 3"  (1.6 m)   General Apperance: NAD HEENT: Normocephalic, atraumatic, anicteric sclera Neck: Supple, trachea midline Lungs: Clear to auscultation bilaterally. No wheezes, rhonchi or rales. Breathing comfortably Heart: Regular rate and rhythm, no murmur/rub/gallop Abdomen: Soft, nontender, nondistended, no rebound/guarding Extremities: Warm and well perfused, no edema Skin: No rashes or lesions Neurologic: Alert and interactive. No gross deficits.  Assessment & Plan:   See Encounters Tab for problem based charting.  Patient discussed with Dr. Evette Doffing

## 2017-01-20 ENCOUNTER — Encounter: Payer: Self-pay | Admitting: Pulmonary Disease

## 2017-01-20 LAB — LIPID PANEL
CHOL/HDL RATIO: 2.9 ratio (ref 0.0–4.4)
Cholesterol, Total: 168 mg/dL (ref 100–199)
HDL: 58 mg/dL (ref >39)
LDL CALC: 90 mg/dL (ref 0–99)
Triglycerides: 100 mg/dL (ref 0–149)
VLDL CHOLESTEROL CAL: 20 mg/dL (ref 5–40)

## 2017-01-20 LAB — BMP8+ANION GAP
Anion Gap: 14 mmol/L (ref 10.0–18.0)
BUN/Creatinine Ratio: 32 — ABNORMAL HIGH (ref 9–23)
BUN: 18 mg/dL (ref 6–24)
CALCIUM: 9.6 mg/dL (ref 8.7–10.2)
CO2: 27 mmol/L (ref 20–29)
Chloride: 99 mmol/L (ref 96–106)
Creatinine, Ser: 0.57 mg/dL (ref 0.57–1.00)
GFR, EST AFRICAN AMERICAN: 118 mL/min/{1.73_m2} (ref >59)
GFR, EST NON AFRICAN AMERICAN: 103 mL/min/{1.73_m2} (ref >59)
Glucose: 88 mg/dL (ref 65–99)
POTASSIUM: 3.6 mmol/L (ref 3.5–5.2)
Sodium: 140 mmol/L (ref 134–144)

## 2017-01-23 ENCOUNTER — Ambulatory Visit (INDEPENDENT_AMBULATORY_CARE_PROVIDER_SITE_OTHER): Payer: BLUE CROSS/BLUE SHIELD | Admitting: *Deleted

## 2017-01-23 DIAGNOSIS — J309 Allergic rhinitis, unspecified: Secondary | ICD-10-CM

## 2017-02-02 ENCOUNTER — Ambulatory Visit (INDEPENDENT_AMBULATORY_CARE_PROVIDER_SITE_OTHER): Payer: BLUE CROSS/BLUE SHIELD | Admitting: *Deleted

## 2017-02-02 DIAGNOSIS — J309 Allergic rhinitis, unspecified: Secondary | ICD-10-CM

## 2017-02-07 ENCOUNTER — Ambulatory Visit (INDEPENDENT_AMBULATORY_CARE_PROVIDER_SITE_OTHER): Payer: BLUE CROSS/BLUE SHIELD | Admitting: *Deleted

## 2017-02-07 DIAGNOSIS — J309 Allergic rhinitis, unspecified: Secondary | ICD-10-CM

## 2017-03-06 ENCOUNTER — Ambulatory Visit (INDEPENDENT_AMBULATORY_CARE_PROVIDER_SITE_OTHER): Payer: BLUE CROSS/BLUE SHIELD

## 2017-03-06 DIAGNOSIS — J309 Allergic rhinitis, unspecified: Secondary | ICD-10-CM | POA: Diagnosis not present

## 2017-04-04 ENCOUNTER — Ambulatory Visit (INDEPENDENT_AMBULATORY_CARE_PROVIDER_SITE_OTHER): Payer: BLUE CROSS/BLUE SHIELD

## 2017-04-04 DIAGNOSIS — J309 Allergic rhinitis, unspecified: Secondary | ICD-10-CM

## 2017-04-08 ENCOUNTER — Other Ambulatory Visit: Payer: Self-pay | Admitting: Allergy and Immunology

## 2017-04-13 ENCOUNTER — Telehealth: Payer: Self-pay | Admitting: Allergy and Immunology

## 2017-04-13 ENCOUNTER — Other Ambulatory Visit: Payer: Self-pay

## 2017-04-13 MED ORDER — OMEPRAZOLE 20 MG PO CPDR: 20.0000 mg | DELAYED_RELEASE_CAPSULE | Freq: Two times a day (BID) | ORAL | 0 refills | Status: DC

## 2017-04-13 NOTE — Telephone Encounter (Signed)
Called and left message for patient to inform her that I have sent in her omeprazole to the pharmacy she requested.

## 2017-04-13 NOTE — Telephone Encounter (Signed)
Patient called back,and I informed her that her medication was sent to the pharmacy.

## 2017-04-13 NOTE — Telephone Encounter (Signed)
Patient needs a refill on her omeprazole; [facility] [address]. She said we told her she needed an appt, which she made for 05-02-17. She said Dr. Neldon Mc was her doctor, but she last saw Dr. Verlin Fester on 09-20-16. Last visit with Dr. Neldon Mc was 12-22-15/

## 2017-04-28 DIAGNOSIS — M65342 Trigger finger, left ring finger: Secondary | ICD-10-CM | POA: Diagnosis not present

## 2017-04-28 DIAGNOSIS — M65331 Trigger finger, right middle finger: Secondary | ICD-10-CM | POA: Diagnosis not present

## 2017-04-28 DIAGNOSIS — M65322 Trigger finger, left index finger: Secondary | ICD-10-CM | POA: Diagnosis not present

## 2017-04-28 DIAGNOSIS — M65332 Trigger finger, left middle finger: Secondary | ICD-10-CM | POA: Diagnosis not present

## 2017-04-28 DIAGNOSIS — M79645 Pain in left finger(s): Secondary | ICD-10-CM | POA: Diagnosis not present

## 2017-05-02 ENCOUNTER — Encounter: Payer: Self-pay | Admitting: Allergy and Immunology

## 2017-05-02 ENCOUNTER — Ambulatory Visit (INDEPENDENT_AMBULATORY_CARE_PROVIDER_SITE_OTHER): Payer: BLUE CROSS/BLUE SHIELD | Admitting: Allergy and Immunology

## 2017-05-02 VITALS — BP 126/78 | HR 88 | Resp 16

## 2017-05-02 DIAGNOSIS — K219 Gastro-esophageal reflux disease without esophagitis: Secondary | ICD-10-CM | POA: Diagnosis not present

## 2017-05-02 DIAGNOSIS — G4720 Circadian rhythm sleep disorder, unspecified type: Secondary | ICD-10-CM | POA: Diagnosis not present

## 2017-05-02 DIAGNOSIS — J4530 Mild persistent asthma, uncomplicated: Secondary | ICD-10-CM | POA: Diagnosis not present

## 2017-05-02 DIAGNOSIS — J3089 Other allergic rhinitis: Secondary | ICD-10-CM | POA: Diagnosis not present

## 2017-05-02 MED ORDER — MOMETASONE FUROATE 50 MCG/ACT NA SUSP: NASAL | 5 refills | Status: DC

## 2017-05-02 MED ORDER — ALBUTEROL SULFATE HFA 108 (90 BASE) MCG/ACT IN AERS: 2.0000 | INHALATION_SPRAY | RESPIRATORY_TRACT | 1 refills | Status: DC | PRN

## 2017-05-02 MED ORDER — BECLOMETHASONE DIPROP HFA 80 MCG/ACT IN AERB: 2.0000 | INHALATION_SPRAY | Freq: Two times a day (BID) | RESPIRATORY_TRACT | 2 refills | Status: DC

## 2017-05-02 MED ORDER — OMEPRAZOLE 20 MG PO CPDR: 20.0000 mg | DELAYED_RELEASE_CAPSULE | Freq: Two times a day (BID) | ORAL | 5 refills | Status: DC

## 2017-05-02 MED ORDER — AZELASTINE HCL 0.1 % NA SOLN: NASAL | 5 refills | Status: DC

## 2017-05-02 NOTE — Progress Notes (Signed)
Follow-up Note  Referring Provider: Colbert Ewing, MD Primary Provider: Colbert Ewing, MD Date of Office Visit: 05/02/2017  Subjective:   Christina Burton (DOB: 09/06/1955) is a 61 y.o. female who returns to the [facility] on 05/02/2017 in re-evaluation of the following:  HPI: Christina Burton returns to this clinic in reevaluation of her asthma and allergic rhinoconjunctivitis and LPR. Her last visit to this clinic was February 2018.  Her asthma has really been doing quite well. She has not required a systemic steroid to treat an exacerbation while continuing on low dose Qvar. She rarely uses a short acting bronchodilator. She does not exercise.  Her nose has been doing quite well while using Nasonex 1 or 2 times per week. She has not required an antibiotic to treat an episode of sinusitis.  I gave her cyproheptadine for her sleep dysfunction and this works great but she can only use it on the weekends because there is a prolonged sedation effect in the day if she uses this during the weekday.  Her reflux is under excellent control at this point in time on her current therapy.  Her immunotherapy is going quite well without any adverse effect. Currently she is using this form of therapy every 4 weeks.  Allergies as of 05/02/2017      Reactions   Amoxicillin Hives   Articaine    Caused numbness that too long - lasted about 6 months when used by dentist   Atenolol Hives   Sulfonamide Derivatives    REACTION: Unknown reaction   Trimox [amoxicillin Trihydrate]       Medication List      acetaminophen 500 MG tablet Commonly known as:  TYLENOL Take 500 mg by mouth every 6 (six) hours as needed. For pain   azelastine 0.1 % nasal spray Commonly known as:  ASTELIN USE ONE SPRAY IN EACH NOSTRIL TWICE DAILY IF NEEDED   CALCIUM PO Take by mouth daily.   CANASA 1000 MG suppository Generic drug:  mesalamine Place 1,000 mg rectally daily as needed. Use as directed by  your gastroenterologist for ulcerative colitis   mesalamine 1.2 g EC tablet Commonly known as:  LIALDA Take 2.4 g by mouth daily.   cetirizine 10 MG tablet Commonly known as:  ZYRTEC Take 10 mg by mouth daily.   cyproheptadine 4 MG tablet Commonly known as:  PERIACTIN TAKE ONE-HALF TO ONE TABLET AT BEDTIME   gabapentin 300 MG capsule Commonly known as:  NEURONTIN Take 300 mg by mouth 3 (three) times daily as needed.   hydrochlorothiazide 25 MG tablet Commonly known as:  HYDRODIURIL Take 0.5 tablets (12.5 mg total) by mouth daily.   mometasone 50 MCG/ACT nasal spray Commonly known as:  NASONEX USE ONE SPRAY IN EACH NOSTRIL TWICE DAILY   montelukast 10 MG tablet Commonly known as:  SINGULAIR Take 10 mg by mouth daily as needed. Reported on 12/22/2015   multivitamin with minerals Tabs tablet Take 1 tablet by mouth daily.   omeprazole 20 MG capsule Commonly known as:  PRILOSEC Take 1 capsule (20 mg total) by mouth 2 (two) times daily.   PARoxetine 20 MG tablet Commonly known as:  PAXIL Take 2 tablets (40 mg total) by mouth daily.   pravastatin 20 MG tablet Commonly known as:  PRAVACHOL Take 1 tablet (20 mg total) by mouth every morning.   PROAIR HFA 108 (90 Base) MCG/ACT inhaler Generic drug:  albuterol Inhale 2 puffs into the lungs every 4 (  four) hours as needed for wheezing or shortness of breath.   VITAMIN C PO Take 1 tablet by mouth daily.   VITAMIN D PO Take by mouth daily.       Past Medical History:  Diagnosis Date  . Allergic rhinitis   . Anxiety disorder   . Hypertension 04/24/2007   Qualifier: Diagnosis of  Problem Stop Reason:  By: Jerilee Hoh MD, Olam Idler    . Mitral valve prolapse   . Obsessive compulsive disorder   . Sinusitis    chronic  . Ulcerative colitis    sees Dr. Collene Mares - normal colonoscopy in Nov 2011    Past Surgical History:  Procedure Laterality Date  . CARPAL TUNNEL RELEASE    . HERNIA REPAIR    . TONSILLECTOMY AND  ADENOIDECTOMY    . TRIGGER FINGER RELEASE     right thumb    Review of systems negative except as noted in HPI / PMHx or noted below:  Review of Systems  Constitutional: Negative.   HENT: Negative.   Eyes: Negative.   Respiratory: Negative.   Cardiovascular: Negative.   Gastrointestinal: Negative.   Genitourinary: Negative.   Musculoskeletal: Negative.   Skin: Negative.   Neurological: Negative.   Endo/Heme/Allergies: Negative.   Psychiatric/Behavioral: Negative.      Objective:   Vitals:   05/02/17 1602  BP: 126/78  Pulse: 88  Resp: 16          Physical Exam  Constitutional: She is well-developed, well-nourished, and in no distress.  HENT:  Head: Normocephalic.  Right Ear: Tympanic membrane, external ear and ear canal normal.  Left Ear: Tympanic membrane, external ear and ear canal normal.  Nose: Nose normal. No mucosal edema or rhinorrhea.  Mouth/Throat: Uvula is midline, oropharynx is clear and moist and mucous membranes are normal. No oropharyngeal exudate.  Eyes: Conjunctivae are normal.  Neck: Trachea normal. No tracheal tenderness present. No tracheal deviation present. No thyromegaly present.  Cardiovascular: Normal rate, regular rhythm, S1 normal, S2 normal and normal heart sounds.   No murmur heard. Pulmonary/Chest: Breath sounds normal. No stridor. No respiratory distress. She has no wheezes. She has no rales.  Musculoskeletal: She exhibits no edema.  Lymphadenopathy:       Head (right side): No tonsillar adenopathy present.       Head (left side): No tonsillar adenopathy present.    She has no cervical adenopathy.  Neurological: She is alert. Gait normal.  Skin: No rash noted. She is not diaphoretic. No erythema. Nails show no clubbing.  Psychiatric: Mood and affect normal.    Diagnostics:    Spirometry was performed and demonstrated an FEV1 of 2.22 at 88 % of predicted.  The patient had an Asthma Control Test with the following results: ACT  Total Score: 20.    Assessment and Plan:   1. Asthma, well controlled, mild persistent   2. Other allergic rhinitis   3. LPRD (laryngopharyngeal reflux disease)   4. Dysfunction of sleep stage or arousal      1. Continue Qvar 80 REDIHALER 2 inhalations twice a day  2. Continue Nasonex one spray each nostril 3-7 times per week  3. Continue immunotherapy and EpiPen  4. Continue omeprazole 20 mg twice a day  5. Continue Cyproheptadine 45m one half to one tablet at bedtime  6. Continue nasal Astelin, Zyrtec, Proventil HFA, and Zaditor if needed  7. Obtain fall flu vaccine  8. Return to clinic in 1 year or earlier if problem  [place]  appears to be doing quite well on her current medical therapy which includes the use of low-dose inhaled steroids and a low-dose nasal steroid and a leukotriene modifier. She will continue on this plan as well as immunotherapy and she will continue on cyproheptadine for her sleep dysfunction. I will see her back in this clinic in 1 year or earlier if there is a problem.  Allena Katz, MD Allergy / Immunology [place]

## 2017-05-02 NOTE — Patient Instructions (Addendum)
  1. Continue Qvar 80 REDIHALER 2 inhalations twice a day  2. Continue Nasonex one spray each nostril 3-7 times per week  3. Continue immunotherapy and EpiPen  4. Continue omeprazole 20 mg twice a day  5. Continue Cyproheptadine 48m one half to one tablet at bedtime  6. Continue nasal Astelin, Zyrtec, Proventil HFA, and Zaditor if needed  7. Obtain fall flu vaccine  8. Return to clinic in 1 year or earlier if problem

## 2017-05-12 DIAGNOSIS — Z23 Encounter for immunization: Secondary | ICD-10-CM | POA: Diagnosis not present

## 2017-05-20 ENCOUNTER — Other Ambulatory Visit: Payer: Self-pay | Admitting: Allergy and Immunology

## 2017-05-22 DIAGNOSIS — M65331 Trigger finger, right middle finger: Secondary | ICD-10-CM | POA: Diagnosis not present

## 2017-05-22 DIAGNOSIS — M65342 Trigger finger, left ring finger: Secondary | ICD-10-CM | POA: Diagnosis not present

## 2017-05-22 DIAGNOSIS — M79641 Pain in right hand: Secondary | ICD-10-CM | POA: Diagnosis not present

## 2017-05-22 DIAGNOSIS — M79642 Pain in left hand: Secondary | ICD-10-CM | POA: Diagnosis not present

## 2017-06-02 ENCOUNTER — Ambulatory Visit (INDEPENDENT_AMBULATORY_CARE_PROVIDER_SITE_OTHER): Payer: BLUE CROSS/BLUE SHIELD

## 2017-06-02 DIAGNOSIS — J309 Allergic rhinitis, unspecified: Secondary | ICD-10-CM | POA: Diagnosis not present

## 2017-06-08 ENCOUNTER — Ambulatory Visit (INDEPENDENT_AMBULATORY_CARE_PROVIDER_SITE_OTHER): Payer: BLUE CROSS/BLUE SHIELD | Admitting: Sports Medicine

## 2017-06-08 ENCOUNTER — Ambulatory Visit
Admission: RE | Admit: 2017-06-08 | Discharge: 2017-06-08 | Disposition: A | Discharge status: Home / Self Care | Payer: BLUE CROSS/BLUE SHIELD | Source: Ambulatory Visit | Attending: Sports Medicine | Admitting: Sports Medicine

## 2017-06-08 VITALS — BP 132/94 | Ht 63.0 in | Wt 135.0 lb

## 2017-06-08 DIAGNOSIS — M79675 Pain in left toe(s): Secondary | ICD-10-CM | POA: Diagnosis not present

## 2017-06-08 DIAGNOSIS — S92412A Displaced fracture of proximal phalanx of left great toe, initial encounter for closed fracture: Secondary | ICD-10-CM | POA: Diagnosis not present

## 2017-06-08 NOTE — Progress Notes (Signed)
   [facility] Phone: [phone]  Subjective:  Christina Burton is a 62 y.o. female here today for evaluation of left toe pain.  Yesterday patient was putting on her slipper she bent her toe in an awkward position and is unsure what she hit her toe against.  Afterward she has noticed the toe swelling and bruising. Pain is elicited by excessive movement of the toe. She has applied ice, buddy-taped her toe, elevated with pillow and tylenol-arthritis.   ROS: See HPI for pertinent positives and negatives  Past Medical History:  Past Medical History:  Diagnosis Date  . Allergic rhinitis   . Anxiety disorder   . Hypertension 04/24/2007   Qualifier: Diagnosis of  Problem Stop Reason:  By: Jerilee Hoh MD, Olam Idler    . Mitral valve prolapse   . Obsessive compulsive disorder   . Sinusitis    chronic  . Ulcerative colitis    sees Dr. Collene Mares - normal colonoscopy in Nov 2011    Family history reviewed for today's visit. No changes.   Objective: BP (!) 132/94   Ht 5' 3"  (1.6 m)   Wt 135 lb (61.2 kg)   LMP 03/01/2010   BMI 23.91 kg/m  Gen: NAD, alert, cooperative with exam HEENT: NCAT, MMM CV: normal perfusion Resp:  normal work of breathing Msk:   L Great toe:  Ecchymosis: present on exam  Edema: no edema appreciated on exam  ROM:  Normal  Gait: heel toe, non-antalgic Palpation: no crepitus on exam, tenderness with active/passive dorsiflexion  Neuro: Alert and oriented, no gross deficits  Ultrasound results of the left great toe:  Avulsion fracture at the IP joint of the left great toe with surrounding effusion   Assessment/Plan: Christina Burton is a 61 y.o. female here today for evaluation of left great toe pain 1. Great toe pain, left -Left great toe pain due to evulsion fraction at the IP joint  -Instructed to wear regular shoe if comfortable otherwise can wear hard post-op shoe -Additionally instructed to Ice and buddy tape - DG Toe Great Left; Future  to further evaluation fracture  -Follow-up in 3 weeks     Marylan Glore L. Sharlene Motts, MD [facility] Pediatric Resident, PGY-3 Primary Care Program  Patient seen and evaluated with the resident. I agree with the above plan of care. X-rays confirm what is seen on ultrasound. Patient has an avulsion fracture at the IP joint of the left great toe. No other fracture is seen. Treatment as above and follow-up in 3 weeks.

## 2017-06-08 NOTE — Patient Instructions (Signed)
You can wear your regular sneaker if it's comfortable. If not, you may wear the hard shoe. Keep your first two toes buddy taped with the tape we gave you. You can put a piece of cotton in between so they don't rub. Ice x 15 minutes several times a day as needed for pain. See [place] in 3 weeks.

## 2017-06-08 NOTE — Progress Notes (Signed)
VIALS EXP 06-09-18

## 2017-06-09 ENCOUNTER — Encounter: Payer: Self-pay | Admitting: Sports Medicine

## 2017-06-09 DIAGNOSIS — J301 Allergic rhinitis due to pollen: Secondary | ICD-10-CM | POA: Diagnosis not present

## 2017-06-19 DIAGNOSIS — Z1231 Encounter for screening mammogram for malignant neoplasm of breast: Secondary | ICD-10-CM | POA: Diagnosis not present

## 2017-06-21 DIAGNOSIS — M79641 Pain in right hand: Secondary | ICD-10-CM | POA: Diagnosis not present

## 2017-06-21 DIAGNOSIS — M79642 Pain in left hand: Secondary | ICD-10-CM | POA: Diagnosis not present

## 2017-06-21 DIAGNOSIS — M65342 Trigger finger, left ring finger: Secondary | ICD-10-CM | POA: Diagnosis not present

## 2017-06-21 DIAGNOSIS — M65331 Trigger finger, right middle finger: Secondary | ICD-10-CM | POA: Diagnosis not present

## 2017-06-23 ENCOUNTER — Emergency Department (HOSPITAL_COMMUNITY): Payer: BLUE CROSS/BLUE SHIELD

## 2017-06-23 ENCOUNTER — Other Ambulatory Visit: Payer: Self-pay

## 2017-06-23 ENCOUNTER — Encounter (HOSPITAL_COMMUNITY): Payer: Self-pay

## 2017-06-23 ENCOUNTER — Emergency Department (HOSPITAL_COMMUNITY)
Admission: EM | Admit: 2017-06-23 | Discharge: 2017-06-23 | Disposition: A | Discharge status: Home / Self Care | Payer: BLUE CROSS/BLUE SHIELD | Attending: Emergency Medicine | Admitting: Emergency Medicine

## 2017-06-23 DIAGNOSIS — R51 Headache: Secondary | ICD-10-CM | POA: Diagnosis not present

## 2017-06-23 DIAGNOSIS — S80212A Abrasion, left knee, initial encounter: Secondary | ICD-10-CM | POA: Insufficient documentation

## 2017-06-23 DIAGNOSIS — S60512A Abrasion of left hand, initial encounter: Secondary | ICD-10-CM | POA: Insufficient documentation

## 2017-06-23 DIAGNOSIS — Z79899 Other long term (current) drug therapy: Secondary | ICD-10-CM | POA: Diagnosis not present

## 2017-06-23 DIAGNOSIS — M542 Cervicalgia: Secondary | ICD-10-CM | POA: Diagnosis not present

## 2017-06-23 DIAGNOSIS — Y9259 Other trade areas as the place of occurrence of the external cause: Secondary | ICD-10-CM | POA: Insufficient documentation

## 2017-06-23 DIAGNOSIS — J45909 Unspecified asthma, uncomplicated: Secondary | ICD-10-CM | POA: Insufficient documentation

## 2017-06-23 DIAGNOSIS — S60511A Abrasion of right hand, initial encounter: Secondary | ICD-10-CM | POA: Diagnosis not present

## 2017-06-23 DIAGNOSIS — S8991XA Unspecified injury of right lower leg, initial encounter: Secondary | ICD-10-CM | POA: Diagnosis not present

## 2017-06-23 DIAGNOSIS — S6992XA Unspecified injury of left wrist, hand and finger(s), initial encounter: Secondary | ICD-10-CM | POA: Diagnosis not present

## 2017-06-23 DIAGNOSIS — W010XXA Fall on same level from slipping, tripping and stumbling without subsequent striking against object, initial encounter: Secondary | ICD-10-CM | POA: Insufficient documentation

## 2017-06-23 DIAGNOSIS — S0081XA Abrasion of other part of head, initial encounter: Secondary | ICD-10-CM | POA: Diagnosis not present

## 2017-06-23 DIAGNOSIS — M549 Dorsalgia, unspecified: Secondary | ICD-10-CM | POA: Insufficient documentation

## 2017-06-23 DIAGNOSIS — Y9389 Activity, other specified: Secondary | ICD-10-CM | POA: Diagnosis not present

## 2017-06-23 DIAGNOSIS — I10 Essential (primary) hypertension: Secondary | ICD-10-CM | POA: Insufficient documentation

## 2017-06-23 DIAGNOSIS — T148XXA Other injury of unspecified body region, initial encounter: Secondary | ICD-10-CM | POA: Diagnosis not present

## 2017-06-23 DIAGNOSIS — M25561 Pain in right knee: Secondary | ICD-10-CM | POA: Diagnosis not present

## 2017-06-23 DIAGNOSIS — M79641 Pain in right hand: Secondary | ICD-10-CM | POA: Diagnosis not present

## 2017-06-23 DIAGNOSIS — Y999 Unspecified external cause status: Secondary | ICD-10-CM | POA: Diagnosis not present

## 2017-06-23 DIAGNOSIS — S6991XA Unspecified injury of right wrist, hand and finger(s), initial encounter: Secondary | ICD-10-CM | POA: Diagnosis not present

## 2017-06-23 DIAGNOSIS — S61409A Unspecified open wound of unspecified hand, initial encounter: Secondary | ICD-10-CM | POA: Diagnosis not present

## 2017-06-23 DIAGNOSIS — M25562 Pain in left knee: Secondary | ICD-10-CM | POA: Diagnosis not present

## 2017-06-23 DIAGNOSIS — T07XXXA Unspecified multiple injuries, initial encounter: Secondary | ICD-10-CM

## 2017-06-23 DIAGNOSIS — S8992XA Unspecified injury of left lower leg, initial encounter: Secondary | ICD-10-CM | POA: Diagnosis not present

## 2017-06-23 DIAGNOSIS — S80211A Abrasion, right knee, initial encounter: Secondary | ICD-10-CM | POA: Insufficient documentation

## 2017-06-23 DIAGNOSIS — M545 Low back pain: Secondary | ICD-10-CM | POA: Diagnosis not present

## 2017-06-23 MED ORDER — HYDROCODONE-ACETAMINOPHEN 5-325 MG PO TABS: 2.0000 | ORAL_TABLET | ORAL | 0 refills | Status: DC | PRN

## 2017-06-23 MED FILL — HYDROCODON-APAP 5-325: 5-325 | 1 days supply | Qty: 10 | Fill #0

## 2017-06-23 NOTE — ED Triage Notes (Addendum)
Per EMS-Patient tripped and fell. No LOC, head, or back pain. Patient has an abrasion to both lips, nose. hands, Left knee, and knuckles. Patient rates pain 6/10.

## 2017-06-23 NOTE — ED Provider Notes (Signed)
Christina Burton DEPT Provider Note   CSN: [id number] Arrival date & time: 06/23/17  1117     History   Chief Complaint Chief Complaint  Patient presents with  . Fall    HPI Christina Burton is a 61 y.o. female.  The history is provided by the patient. No language interpreter was used.  Fall  This is a new problem. The current episode started 2 days ago. The problem occurs constantly. The problem has been gradually worsening. Nothing aggravates the symptoms. Nothing relieves the symptoms. She has tried nothing for the symptoms. The treatment provided no relief.  Pt reports she slipped and fell while Christmas shopping.  Pt hit both hands and both knees.  Pt hit her face on the ground.  Pt has pain in her jaw and her nose.  Pt complains of soreness in her neck and her back  Past Medical History:  Diagnosis Date  . Allergic rhinitis   . Anxiety disorder   . Hypertension 04/24/2007   Qualifier: Diagnosis of  Problem Stop Reason:  By: Jerilee Hoh MD, Olam Idler    . Mitral valve prolapse   . Obsessive compulsive disorder   . Sinusitis    chronic  . Ulcerative colitis    sees Dr. Collene Mares - normal colonoscopy in Nov 2011    Patient Active Problem List   Diagnosis Date Noted  . Memory problem 07/22/2016  . Mild persistent asthma 04/06/2015  . Sciatica 06/18/2014  . GERD (gastroesophageal reflux disease) 12/31/2012  . Insomnia 12/28/2010  . Hyperlipidemia 03/17/2008  . Hypertension 04/24/2007  . ANXIETY DISORDER 06/10/2006  . Obsessive-compulsive disorder 06/10/2006  . MITRAL VALVE PROLAPSE 06/10/2006  . Allergic rhinitis 06/10/2006  . Ulcerative colitis ([place]) 06/10/2006  . CARPAL TUNNEL RELEASE, HX OF 06/10/2006    Past Surgical History:  Procedure Laterality Date  . CARPAL TUNNEL RELEASE    . HERNIA REPAIR    . TONSILLECTOMY AND ADENOIDECTOMY    . TRIGGER FINGER RELEASE     right thumb    OB History    No data available       Home  Medications    Prior to Admission medications   Medication Sig Start Date End Date Taking? Authorizing Provider  acetaminophen (TYLENOL) 500 MG tablet Take 500 mg by mouth every 6 (six) hours as needed. For pain    [provider]  albuterol (PROAIR HFA) 108 (90 Base) MCG/ACT inhaler Inhale 2 puffs into the lungs every 4 (four) hours as needed for wheezing or shortness of breath. 05/02/17   Kozlow, Donnamarie Poag, MD  Ascorbic Acid (VITAMIN C PO) Take 1 tablet by mouth daily.    [provider]  azelastine (ASTELIN) 0.1 % nasal spray USE ONE SPRAY IN EACH NOSTRIL TWICE DAILY IF NEEDED 05/02/17   Kozlow, Donnamarie Poag, MD  beclomethasone (QVAR REDIHALER) 80 MCG/ACT inhaler Inhale 2 puffs into the lungs 2 (two) times daily. 05/02/17   Kozlow, Donnamarie Poag, MD  CALCIUM PO Take by mouth daily.    [provider]  cetirizine (ZYRTEC) 10 MG tablet Take 10 mg by mouth daily.      [provider]  Cholecalciferol (VITAMIN D PO) Take by mouth daily.    [provider]  cyproheptadine (PERIACTIN) 4 MG tablet TAKE ONE-HALF TO ONE TABLET AT BEDTIME 12/22/15   Kozlow, Donnamarie Poag, MD  gabapentin (NEURONTIN) 300 MG capsule Take 300 mg by mouth 3 (three) times daily as needed.    [provider]  hydrochlorothiazide (HYDRODIURIL) 25 MG tablet Take 0.5 tablets (12.5 mg total) by mouth daily. 07/22/16   Milagros Loll, MD  mesalamine (CANASA) 1000 MG suppository Place 1,000 mg rectally daily as needed. Use as directed by your gastroenterologist for ulcerative colitis    [provider]  mesalamine (LIALDA) 1.2 G EC tablet Take 2.4 g by mouth daily.     [provider]  mometasone (NASONEX) 50 MCG/ACT nasal spray USE ONE SPRAY IN EACH NOSTRIL TWICE DAILY 05/02/17   Kozlow, Donnamarie Poag, MD  Multiple Vitamin (MULTIVITAMIN WITH MINERALS) TABS Take 1 tablet by mouth daily.    [provider]  omeprazole (PRILOSEC) 20 MG capsule Take 1 capsule (20 mg total) by mouth 2  (two) times daily. 05/02/17   Kozlow, Donnamarie Poag, MD  omeprazole (PRILOSEC) 20 MG capsule TAKE 1 CAPSULE(20 MG) BY MOUTH TWICE DAILY 05/22/17   Kozlow, Donnamarie Poag, MD  PARoxetine (PAXIL) 20 MG tablet Take 2 tablets (40 mg total) by mouth daily. 07/21/16   Milagros Loll, MD  pravastatin (PRAVACHOL) 20 MG tablet Take 1 tablet (20 mg total) by mouth every morning. 07/21/16   Milagros Loll, MD    Family History Family History  Problem Relation Age of Onset  . Hypertension Mother   . Hyperlipidemia Mother   . Asthma Mother   . Eczema Mother   . Diabetes Brother   . Asthma Brother   . Lymphoma Father   . Seizures Sister   . Asthma Sister   . Diabetes Maternal Grandmother   . Allergic rhinitis Neg Hx   . Angioedema Neg Hx   . Immunodeficiency Neg Hx     Social History Social History   Tobacco Use  . Smoking status: Never Smoker  . Smokeless tobacco: Never Used  Substance Use Topics  . Alcohol use: No    Alcohol/week: 0.0 oz  . Drug use: No     Allergies   Amoxicillin; Articaine; Atenolol; Sulfonamide derivatives; and Trimox [amoxicillin trihydrate]   Review of Systems Review of Systems  All other systems reviewed and are negative.    Physical Exam Updated Vital Signs BP (!) 158/81 (BP Location: Right Arm)   Pulse 64   Temp 97.6 F (36.4 C) (Oral)   Resp (!) 22   Ht 5' 3"  (1.6 m)   Wt 61.2 kg (135 lb)   LMP 03/01/2010   SpO2 100%   BMI 23.91 kg/m   Physical Exam  Constitutional: She appears well-developed and well-nourished.  HENT:  Head: Normocephalic.  Eyes: Pupils are equal, round, and reactive to light.  Cardiovascular: Normal rate.  Pulmonary/Chest: Effort normal.  Abdominal: Soft.  Musculoskeletal: She exhibits tenderness.  Abrasions bilat hands.  Abrasion bilat knees,  fraom  nv and ns intact  Tender C spine and tender LS spine diffusely tender   Neurological: She is alert.  Skin: Skin is warm.  Psychiatric: She has a normal mood and affect.    Nursing note and vitals reviewed.    ED Treatments / Results  Labs (all labs ordered are listed, but only abnormal results are displayed) Labs Reviewed - No data to display  EKG  EKG Interpretation None       Radiology Dg Cervical Spine Complete  Result Date: 06/23/2017 CLINICAL DATA:  Neck pain after fall today. EXAM: CERVICAL SPINE - COMPLETE 4+ VIEW COMPARISON:  Radiographs of March 04, 2012. FINDINGS: Grade 1 retrolisthesis of C3-4 is noted secondary to severe degenerative disc disease  at this level. Moderate degenerative disc disease is noted at C5-6 and C6-7. No prevertebral soft tissue swelling is noted. Mild neural foraminal stenosis is noted bilaterally at C5-6, C6-7 and C7-T1 secondary to uncovertebral spurring. IMPRESSION: Multilevel degenerative disc disease. No acute abnormality seen in the cervical spine. Electronically Signed   By: Marijo Conception, M.D.   On: 06/23/2017 12:52   Dg Lumbar Spine Complete  Result Date: 06/23/2017 CLINICAL DATA:  Low back pain after fall today. EXAM: LUMBAR SPINE - COMPLETE 4+ VIEW COMPARISON:  None. FINDINGS: Mild grade 1 anterolisthesis of L5-S1 is noted secondary to bilateral pars defects of L5. Severe degenerative disc disease is noted at L3-4 with anterior and posterior osteophyte formation. No acute fracture is noted. Mild lateral subluxation of L3 on L4 is noted to the left. IMPRESSION: Severe degenerative disc disease is noted at L3-4. Mild grade 1 anterolisthesis of L5-S1 secondary to bilateral L5 pars defects. No acute abnormality seen. Electronically Signed   By: Marijo Conception, M.D.   On: 06/23/2017 12:55   Dg Knee Complete 4 Views Left  Result Date: 06/23/2017 CLINICAL DATA:  Fall, knee pain. EXAM: LEFT KNEE - COMPLETE 4+ VIEW COMPARISON:  None. FINDINGS: Osseous alignment is normal. Bone mineralization is normal. No fracture line or displaced fracture fragment. No degenerative change. No appreciable joint effusion. Adjacent  soft tissues are unremarkable. IMPRESSION: Negative. Electronically Signed   By: Franki Cabot M.D.   On: 06/23/2017 13:09   Dg Knee Complete 4 Views Right  Result Date: 06/23/2017 CLINICAL DATA:  Fall today, knee pain. EXAM: RIGHT KNEE - COMPLETE 4+ VIEW COMPARISON:  None. FINDINGS: Osseous alignment is normal. Bone mineralization is normal. No fracture line or displaced fracture fragment seen. No appreciable joint effusion and adjacent soft tissues are unremarkable. IMPRESSION: Negative. Electronically Signed   By: Franki Cabot M.D.   On: 06/23/2017 13:08   Dg Hand Complete Left  Result Date: 06/23/2017 CLINICAL DATA:  Fall today, cuts on wrists and palms of both hands. EXAM: LEFT HAND - COMPLETE 3+ VIEW COMPARISON:  None. FINDINGS: Osseous alignment is normal. Bone mineralization is normal. Questionable irregularity along the posterior-lateral portion of the triquetrum, possibly a minimally displaced triquetrum fracture. No other possible osseous fracture or dislocation. Soft tissues about the hand are unremarkable. IMPRESSION: Possible minimally displaced triquetrum fracture, but not convincing. If any focal pain along the posterior-lateral carpal row, consider CT for further characterization Electronically Signed   By: Franki Cabot M.D.   On: 06/23/2017 13:07   Dg Hand Complete Right  Result Date: 06/23/2017 CLINICAL DATA:  Right hand pain after fall today. EXAM: RIGHT HAND - COMPLETE 3+ VIEW COMPARISON:  None. FINDINGS: There is no evidence of fracture or dislocation. There is no evidence of arthropathy or other focal bone abnormality. Soft tissues are unremarkable. IMPRESSION: Normal right hand. Electronically Signed   By: Marijo Conception, M.D.   On: 06/23/2017 13:00   Ct Maxillofacial Wo Contrast  Result Date: 06/23/2017 CLINICAL DATA:  Fall today, nasal pain and swelling, upper lip pain and swelling with abrasions. EXAM: CT MAXILLOFACIAL WITHOUT CONTRAST TECHNIQUE: Multidetector CT  imaging of the maxillofacial structures was performed. Multiplanar CT image reconstructions were also generated. COMPARISON:  CT facial bones dated 2006 P FINDINGS: Osseous: Lower frontal bones are intact and normally aligned. Osseous structures about the orbits are intact and normally aligned bilaterally. No displaced nasal bone fracture. Bilateral zygoma and pterygoid plates are intact. No mandible fracture or displacement  seen. Maxilla appears intact. No obvious tooth dislodgement seen. Orbits: Orbital globes appear intact and symmetrically positioned. No retro-orbital fluid or edema. Sinuses: Mild mucosal thickening, versus small chronic mucous retention cysts, within the maxillary sinuses. No fluid level or other signs of acute sinusitis. Soft tissues: No superficial soft tissue hematoma. Limited intracranial: No significant or unexpected finding. IMPRESSION: No facial bone fracture or dislocation seen. Electronically Signed   By: Franki Cabot M.D.   On: 06/23/2017 13:03    Procedures Procedures (including critical care time)  Medications Ordered in ED Medications - No data to display   Initial Impression / Assessment and Plan / ED Course  I have reviewed the triage vital signs and the nursing notes.  Pertinent labs & imaging results that were available during my care of the patient were reviewed by me and considered in my medical decision making (see chart for details).  Clinical Course as of Jun 23 1313  Fri Jun 23, 2017  1314 DG Cervical Spine Complete [LS]    Clinical Course User Index [LS] Fransico Meadow, PA-C   Wrist splint to left hand and wrist.  Pt advised to follow up with Hand for evaluiation. Ice to areas of bruising   Final Clinical Impressions(s) / ED Diagnoses   Final diagnoses:  Multiple abrasions  Multiple contusions    ED Discharge Orders        Ordered    HYDROcodone-acetaminophen (NORCO/VICODIN) 5-325 MG tablet  Every 4 hours PRN     06/23/17 1340    An  After Visit Summary was printed and given to the patient.    Fransico Meadow, PA-C 06/23/17 [place], Ankit, MD 06/23/17 1623

## 2017-06-23 NOTE — ED Notes (Signed)
Bed: WTR7 Expected date:  Expected time:  Means of arrival:  Comments: 

## 2017-06-23 NOTE — ED Notes (Addendum)
Pt is alert and oriented x 4 and is verbally responsive. Pt is tearful, and upset about her falls. Pt reports generalized pain at this time. Pt has abrasions to left knee, and rt hand. Area cleansed.

## 2017-06-23 NOTE — Discharge Instructions (Addendum)
Follow up with Dr. Fredna Dow for recheck of hand

## 2017-06-28 DIAGNOSIS — M25532 Pain in left wrist: Secondary | ICD-10-CM | POA: Diagnosis not present

## 2017-06-28 DIAGNOSIS — M79641 Pain in right hand: Secondary | ICD-10-CM | POA: Diagnosis not present

## 2017-06-28 DIAGNOSIS — M79642 Pain in left hand: Secondary | ICD-10-CM | POA: Diagnosis not present

## 2017-07-03 ENCOUNTER — Ambulatory Visit (INDEPENDENT_AMBULATORY_CARE_PROVIDER_SITE_OTHER): Payer: BLUE CROSS/BLUE SHIELD | Admitting: Sports Medicine

## 2017-07-03 ENCOUNTER — Ambulatory Visit (INDEPENDENT_AMBULATORY_CARE_PROVIDER_SITE_OTHER): Payer: BLUE CROSS/BLUE SHIELD | Admitting: *Deleted

## 2017-07-03 VITALS — BP 106/78 | Ht 63.0 in | Wt 135.0 lb

## 2017-07-03 DIAGNOSIS — J309 Allergic rhinitis, unspecified: Secondary | ICD-10-CM | POA: Diagnosis not present

## 2017-07-03 DIAGNOSIS — M79675 Pain in left toe(s): Secondary | ICD-10-CM | POA: Diagnosis not present

## 2017-07-03 NOTE — Progress Notes (Signed)
   Subjective:    Patient ID: Christina Burton, female    DOB: [birth year], 61 y.o.   MRN: [id number]  HPI Christina Burton is a 61 year old female here for follow-up concerning L great toe IP joint avulsion fracture.  Patient stubbed the toe on November 7 and had immediate pain swelling.  She as seen on 11/08 and dx of L great toe IP joint avulsion fracture was confirmed with [place]. Subsequently, on the day before Thanksgiving she sustained a fall during which she aggravated the toe injury.  She has been icing, buddy taping the toe and using Tylenol for pain relief.  Patient reports marked improvement in her pain and functionality.  Denies any numbness or tingling in the feet.   Review of Systems Constitutional: Negative MSK: See HPI    Objective:   Physical Exam General: No acute distress, alert and oriented x3 MSK left great toe: Patient still with bruising and mild edema to the IP joint, tenderness to palpation at the IP joint, 5 out of 5 strength with dorsiflexion and plantar flexion of the great toe at the MTP and IP joints, neurovascularly intact distally    Assessment & Plan:  #Avulsion fracture at the DIP joint of the left great toe -Have advised patient to buddy tape as needed for pain, continue using Tylenol for pain relief. -She may follow-up in clinic as needed  Plan of care discussed with Dr. Micheline Chapman who agrees with plan.  Zella Richer, MD  Patient seen and evaluated with the resident. I agree with the above plan of care. Quick bedside ultrasound today still shows evidence of her avulsion fracture off the IP joint. This does correlate with her previous x-rays. This is a stable fracture that should heal without complication. Follow-up as needed.

## 2017-07-19 DIAGNOSIS — M79642 Pain in left hand: Secondary | ICD-10-CM | POA: Diagnosis not present

## 2017-07-19 DIAGNOSIS — M65342 Trigger finger, left ring finger: Secondary | ICD-10-CM | POA: Diagnosis not present

## 2017-07-31 ENCOUNTER — Ambulatory Visit (INDEPENDENT_AMBULATORY_CARE_PROVIDER_SITE_OTHER): Payer: BLUE CROSS/BLUE SHIELD

## 2017-07-31 DIAGNOSIS — J309 Allergic rhinitis, unspecified: Secondary | ICD-10-CM

## 2017-08-04 ENCOUNTER — Other Ambulatory Visit: Payer: Self-pay | Admitting: *Deleted

## 2017-08-04 DIAGNOSIS — F422 Mixed obsessional thoughts and acts: Secondary | ICD-10-CM

## 2017-08-05 MED ORDER — PAROXETINE HCL 20 MG PO TABS: 40.0000 mg | ORAL_TABLET | Freq: Every day | ORAL | 1 refills | Status: DC

## 2017-08-28 ENCOUNTER — Ambulatory Visit (INDEPENDENT_AMBULATORY_CARE_PROVIDER_SITE_OTHER): Payer: BLUE CROSS/BLUE SHIELD | Admitting: *Deleted

## 2017-08-28 DIAGNOSIS — J309 Allergic rhinitis, unspecified: Secondary | ICD-10-CM

## 2017-09-05 ENCOUNTER — Ambulatory Visit (INDEPENDENT_AMBULATORY_CARE_PROVIDER_SITE_OTHER): Payer: BLUE CROSS/BLUE SHIELD | Admitting: *Deleted

## 2017-09-05 DIAGNOSIS — J309 Allergic rhinitis, unspecified: Secondary | ICD-10-CM | POA: Diagnosis not present

## 2017-09-11 ENCOUNTER — Ambulatory Visit (INDEPENDENT_AMBULATORY_CARE_PROVIDER_SITE_OTHER): Payer: BLUE CROSS/BLUE SHIELD | Admitting: *Deleted

## 2017-09-11 DIAGNOSIS — J309 Allergic rhinitis, unspecified: Secondary | ICD-10-CM

## 2017-09-19 ENCOUNTER — Ambulatory Visit (INDEPENDENT_AMBULATORY_CARE_PROVIDER_SITE_OTHER): Payer: BLUE CROSS/BLUE SHIELD | Admitting: *Deleted

## 2017-09-19 DIAGNOSIS — J309 Allergic rhinitis, unspecified: Secondary | ICD-10-CM

## 2017-09-25 ENCOUNTER — Ambulatory Visit (INDEPENDENT_AMBULATORY_CARE_PROVIDER_SITE_OTHER): Payer: BLUE CROSS/BLUE SHIELD

## 2017-09-25 DIAGNOSIS — J309 Allergic rhinitis, unspecified: Secondary | ICD-10-CM | POA: Diagnosis not present

## 2017-10-05 DIAGNOSIS — H5203 Hypermetropia, bilateral: Secondary | ICD-10-CM | POA: Diagnosis not present

## 2017-10-05 DIAGNOSIS — H2513 Age-related nuclear cataract, bilateral: Secondary | ICD-10-CM | POA: Diagnosis not present

## 2017-10-05 DIAGNOSIS — H524 Presbyopia: Secondary | ICD-10-CM | POA: Diagnosis not present

## 2017-10-05 DIAGNOSIS — H538 Other visual disturbances: Secondary | ICD-10-CM | POA: Diagnosis not present

## 2017-10-09 ENCOUNTER — Other Ambulatory Visit: Payer: Self-pay | Admitting: Allergy and Immunology

## 2017-10-10 ENCOUNTER — Other Ambulatory Visit: Payer: Self-pay | Admitting: *Deleted

## 2017-10-10 NOTE — Telephone Encounter (Signed)
Received faxed refill request from pts pharmacy for pravastatin 78m tabs.  Pt's last visit 01/19/2017 and has upcoming appt on 10/19/17 with pcp.  Will send request to pcp for consideration, please advise.GDespina HiddenCassady3/12/20194:58 PM

## 2017-10-13 NOTE — Telephone Encounter (Signed)
pravastatin (PRAVACHOL) 20 MG tablet      REFILL REQUEST.

## 2017-10-14 MED ORDER — PRAVASTATIN SODIUM 20 MG PO TABS: 20.0000 mg | ORAL_TABLET | Freq: Every morning | ORAL | 3 refills | Status: DC

## 2017-10-19 ENCOUNTER — Encounter: Payer: Self-pay | Admitting: Internal Medicine

## 2017-10-19 ENCOUNTER — Ambulatory Visit: Payer: BLUE CROSS/BLUE SHIELD | Admitting: Internal Medicine

## 2017-10-19 VITALS — BP 127/80 | HR 107 | Temp 97.9°F | Wt 141.0 lb

## 2017-10-19 DIAGNOSIS — Z79899 Other long term (current) drug therapy: Secondary | ICD-10-CM

## 2017-10-19 DIAGNOSIS — Z9181 History of falling: Secondary | ICD-10-CM | POA: Diagnosis not present

## 2017-10-19 DIAGNOSIS — J45909 Unspecified asthma, uncomplicated: Secondary | ICD-10-CM

## 2017-10-19 DIAGNOSIS — M545 Low back pain: Secondary | ICD-10-CM | POA: Diagnosis not present

## 2017-10-19 DIAGNOSIS — M6530 Trigger finger, unspecified finger: Secondary | ICD-10-CM | POA: Diagnosis not present

## 2017-10-19 DIAGNOSIS — M5116 Intervertebral disc disorders with radiculopathy, lumbar region: Secondary | ICD-10-CM

## 2017-10-19 DIAGNOSIS — R413 Other amnesia: Secondary | ICD-10-CM

## 2017-10-19 DIAGNOSIS — E785 Hyperlipidemia, unspecified: Secondary | ICD-10-CM | POA: Diagnosis not present

## 2017-10-19 DIAGNOSIS — F429 Obsessive-compulsive disorder, unspecified: Secondary | ICD-10-CM | POA: Diagnosis not present

## 2017-10-19 DIAGNOSIS — Z87828 Personal history of other (healed) physical injury and trauma: Secondary | ICD-10-CM

## 2017-10-19 DIAGNOSIS — K5190 Ulcerative colitis, unspecified, without complications: Secondary | ICD-10-CM

## 2017-10-19 DIAGNOSIS — Z0000 Encounter for general adult medical examination without abnormal findings: Secondary | ICD-10-CM | POA: Diagnosis not present

## 2017-10-19 DIAGNOSIS — I10 Essential (primary) hypertension: Secondary | ICD-10-CM | POA: Diagnosis not present

## 2017-10-19 DIAGNOSIS — G4700 Insomnia, unspecified: Secondary | ICD-10-CM

## 2017-10-19 DIAGNOSIS — F411 Generalized anxiety disorder: Secondary | ICD-10-CM

## 2017-10-19 DIAGNOSIS — M5432 Sciatica, left side: Secondary | ICD-10-CM

## 2017-10-19 MED ORDER — MELATONIN 10 MG PO TABS: 10.0000 mg | ORAL_TABLET | Freq: Every evening | ORAL | 0 refills | Status: DC | PRN

## 2017-10-19 NOTE — Assessment & Plan Note (Addendum)
Assessment BP 127/80. Home regimen includes HCTZ 12.38m daily. Denies HA, chest pain, shortness of breath, or lightheadedness.  Plan - Continue HCTZ 12.579mdaily - F/u in 3 months

## 2017-10-19 NOTE — Progress Notes (Signed)
   CC: management of HTN  HPI:  [name] is a 62 y.o. female with PMH of HTN, HLD, UC, OCD, and asthma who presents for management of HTN.  She fell once in November 2018 after stumbling on an incline and reports that since then she feels she has been stumbling more frequently. She thinks it is mostly right-sided but denies unsteadiness, dizziness, chest pain, palpitations, LE weakness or numbness. She does endorse sciatica symptoms particularly on the left and some on the right. She endorses "heaviness" in her legs, particularly when walking up steps. Has not been exercising as much. She has been seen by [facility] and received two injections, which were helpful. She is waiting to get the third. She also reports that she has not been able to get surgery on her back due to financial issues.  She also endorses continued insomnia. She thinks she is taking melatonin 26m which helps occasionally. She endorses mostly difficulty with maintenance of sleep, but occasionally, she has a hard time falling asleep. She tried benadryl once, but that made her too groggy the next morning. She endorses good sleep hygiene, including limiting screen time, only sleeping in bed, no alcohol or caffeine intake. She does occasionally eat chocolate at night close to bedtime.  She continues to endorse memory problems. Has been trying to do some puzzles and brain exercises on her phone. She endorses difficulty with short term memory, okay with long term memory. She has not had any difficulty driving.  Please see the assessment and plan below for the status of the patient's chronic medical problems.  Past Medical History:  Diagnosis Date  . Allergic rhinitis   . Anxiety disorder   . Hypertension 04/24/2007   Qualifier: Diagnosis of  Problem Stop Reason:  By: HJerilee HohMD, EOlam Idler   . Mitral valve prolapse   . Obsessive compulsive disorder   . Sinusitis    chronic  . Ulcerative colitis    sees Dr.  MCollene Mares- normal colonoscopy in Nov 2011   Review of Systems:   GEN: Negative for fevers. NEURO: Negative for headaches, lightheadedness, or unsteadiness. CV: Negative for chest pain or palpitations PULM: Negative for shortness of breath or cough. MSK: Positive for stumbles, lower back pain, and one fall in Nov 2018.  Physical Exam:  Vitals:   10/19/17 1541  BP: 127/80  Pulse: (!) 107  Temp: 97.9 F (36.6 C)  TempSrc: Oral  SpO2: 97%  Weight: 141 lb (64 kg)   GEN: Sitting in chair in NAD CV: NR & RR, no m/r/g PULM: CTAB, no wheezes or rales NEURO: CN II-XII grossly intact. 5/5 strength in BLE. Normal sensation. 4+/5 strength in grip strength on left (2/2 trigger finger) and on shoulder abduction on right (2/2 old shoulder injury). Otherwise 5/5 strength in BUE. PSYCH: Patient is anxious and pleasant.  Assessment & Plan:   See Encounters Tab for problem based charting.  Patient discussed with Dr. VEvette Doffing

## 2017-10-19 NOTE — Assessment & Plan Note (Signed)
Assessment Continues to endorse some insomnia, mostly with maintenance of sleep. She thinks she takes 54m of melatonin at bedtime, but she is not sure what the actual dose is. Reports good sleep hygiene. Anxiety may be playing a contributory role here.  Plan - Re-emphasized importance of continuing good sleep hygiene - Melatonin 147mPRN at bedtime (discussed taking this at sunset, about 2-3 hours prior to bedtime, for it to really work well)

## 2017-10-19 NOTE — Assessment & Plan Note (Addendum)
Assessment Lumbar x-ray in 06/2017 shows severe degenerative disc disease is noted at L3-4. Mild grade 1 anterolisthesis of L5-S1 secondary to bilateral L5 pars defects. She has been seen by [facility]. They have discussed surgery, however she reports she cannot do this at this point because of financial limitations. Some of her stumbling/fall may be related to her back. Encouraged her to follow up with Orthopedics for further management. Also discussed that pain/sedating medications can contribute to falls/weakness. She reports she has not taken gabapentin in a while because it does make her feel funny. Encouraged her to continue exercising, as this can help her build her strength and limit falls.  Plan - Encourage regular exercise - Continue follow up with Orthopedics - F/u in 3 months or sooner if needed

## 2017-10-19 NOTE — Patient Instructions (Addendum)
FOLLOW-UP INSTRUCTIONS When: 3 months For: BP management What to bring: medications   Christina Burton,  It was a pleasure to meet you.  For your insomnia, you can take melatonin 74m as needed at night. It is important to take this when it starts to get dark outside (around sunset). It is also important to set a bedtime routine. Remember to try to cut out screen time an hour before bed and avoid chocolate before bedtime.  For your memory, it is reassuring that you scored well on the mini test we did during your visit today. Please continue to do the puzzles and brain exercises as you are able.  For your falls and stumbling, your exam is reassuring and it may be related to your back. The other thing that is a very common cause for falling and stumbling are medicines, like benadryl and gabapentin. I do not think we need to check you for diabetes today. If the falls start to significantly increase in frequency, please come back to see u[place]asooner.  Please see me back in 3 months or sooner if needed.   Here are a few tips that can help with your sleep routine: - Keep a consistent sleep schedule. Get up at the same time every day, even on weekends or during vacations. - Set a bedtime that is early enough for you to get at least 7 hours of sleep. - Don't go to bed unless you are sleepy.  - If you don't fall asleep after 20 minutes, get out of bed.  - Establish a relaxing bedtime routine.  - Use your bed only for sleep and sex.  - Make your bedroom quiet and relaxing. Keep the room at a comfortable, cool temperature.  - Limit exposure to bright light in the evenings. - Turn off electronic devices at least one hour before bedtime. - Don't eat a large meal before bedtime. If you are hungry at night, eat a light, healthy snack.  - Exercise regularly and maintain a healthy diet.  - Avoid consuming caffeine in the late afternoon or evening.  - Avoid consuming alcohol before bedtime.  - Reduce your  fluid intake before bedtime.

## 2017-10-19 NOTE — Assessment & Plan Note (Signed)
Assessment She brings herself in with complaints of memory difficulties. Mini cog today was 5/5. She endorses difficulty with short term memory, not long term memory. No family history of dementia. She reports no issues with driving. TSH and B12 were normal in 2017. Reassured her that she scored well on the mini-cog and has not had issues with driving, so I do not think she has dementia at this point and to continue doing puzzles and exercises for her memory. I think her general anxiety plays some role in this as well.  Plan - Continue improving memory with puzzles/brain exercises

## 2017-10-19 NOTE — Assessment & Plan Note (Signed)
HCV and HIV screening - Check HCV and HIV antibody today. Will call patient with results

## 2017-10-20 LAB — HEPATITIS C ANTIBODY: Hep C Virus Ab: <0.1 s/co ratio (ref 0.0–0.9)

## 2017-10-20 LAB — HIV ANTIBODY (ROUTINE TESTING W REFLEX): HIV Screen 4th Generation wRfx: NONREACTIVE

## 2017-10-20 NOTE — Progress Notes (Signed)
Internal Medicine Clinic Attending  Case discussed with Dr. Ronalee Red  at the time of the visit.  We reviewed the resident's history and exam and pertinent patient test results.  I agree with the assessment, diagnosis, and plan of care documented in the resident's note.

## 2017-10-23 ENCOUNTER — Ambulatory Visit (INDEPENDENT_AMBULATORY_CARE_PROVIDER_SITE_OTHER): Payer: BLUE CROSS/BLUE SHIELD | Admitting: *Deleted

## 2017-10-23 DIAGNOSIS — J309 Allergic rhinitis, unspecified: Secondary | ICD-10-CM

## 2017-11-23 ENCOUNTER — Ambulatory Visit (INDEPENDENT_AMBULATORY_CARE_PROVIDER_SITE_OTHER): Payer: BLUE CROSS/BLUE SHIELD | Admitting: *Deleted

## 2017-11-23 DIAGNOSIS — J309 Allergic rhinitis, unspecified: Secondary | ICD-10-CM | POA: Diagnosis not present

## 2017-12-20 ENCOUNTER — Ambulatory Visit (INDEPENDENT_AMBULATORY_CARE_PROVIDER_SITE_OTHER): Payer: BLUE CROSS/BLUE SHIELD | Admitting: *Deleted

## 2017-12-20 DIAGNOSIS — J309 Allergic rhinitis, unspecified: Secondary | ICD-10-CM

## 2017-12-28 NOTE — Progress Notes (Signed)
VIALS EXP 12-30-18

## 2017-12-29 DIAGNOSIS — J3089 Other allergic rhinitis: Secondary | ICD-10-CM | POA: Diagnosis not present

## 2018-01-09 DIAGNOSIS — Z1211 Encounter for screening for malignant neoplasm of colon: Secondary | ICD-10-CM | POA: Diagnosis not present

## 2018-01-09 DIAGNOSIS — K219 Gastro-esophageal reflux disease without esophagitis: Secondary | ICD-10-CM | POA: Diagnosis not present

## 2018-01-09 DIAGNOSIS — K5190 Ulcerative colitis, unspecified, without complications: Secondary | ICD-10-CM | POA: Diagnosis not present

## 2018-01-16 ENCOUNTER — Ambulatory Visit (INDEPENDENT_AMBULATORY_CARE_PROVIDER_SITE_OTHER): Payer: BLUE CROSS/BLUE SHIELD | Admitting: *Deleted

## 2018-01-16 DIAGNOSIS — J309 Allergic rhinitis, unspecified: Secondary | ICD-10-CM

## 2018-01-21 ENCOUNTER — Encounter: Payer: Self-pay | Admitting: *Deleted

## 2018-02-13 ENCOUNTER — Ambulatory Visit (INDEPENDENT_AMBULATORY_CARE_PROVIDER_SITE_OTHER): Payer: BLUE CROSS/BLUE SHIELD | Admitting: *Deleted

## 2018-02-13 DIAGNOSIS — J309 Allergic rhinitis, unspecified: Secondary | ICD-10-CM | POA: Diagnosis not present

## 2018-03-15 ENCOUNTER — Ambulatory Visit (INDEPENDENT_AMBULATORY_CARE_PROVIDER_SITE_OTHER): Payer: BLUE CROSS/BLUE SHIELD | Admitting: *Deleted

## 2018-03-15 DIAGNOSIS — J309 Allergic rhinitis, unspecified: Secondary | ICD-10-CM | POA: Diagnosis not present

## 2018-03-19 ENCOUNTER — Ambulatory Visit (INDEPENDENT_AMBULATORY_CARE_PROVIDER_SITE_OTHER): Payer: BLUE CROSS/BLUE SHIELD | Admitting: *Deleted

## 2018-03-19 DIAGNOSIS — J309 Allergic rhinitis, unspecified: Secondary | ICD-10-CM

## 2018-03-26 ENCOUNTER — Ambulatory Visit (INDEPENDENT_AMBULATORY_CARE_PROVIDER_SITE_OTHER): Payer: BLUE CROSS/BLUE SHIELD | Admitting: *Deleted

## 2018-03-26 DIAGNOSIS — J309 Allergic rhinitis, unspecified: Secondary | ICD-10-CM | POA: Diagnosis not present

## 2018-04-03 ENCOUNTER — Ambulatory Visit (INDEPENDENT_AMBULATORY_CARE_PROVIDER_SITE_OTHER): Payer: BLUE CROSS/BLUE SHIELD

## 2018-04-03 DIAGNOSIS — J309 Allergic rhinitis, unspecified: Secondary | ICD-10-CM

## 2018-04-09 ENCOUNTER — Ambulatory Visit (INDEPENDENT_AMBULATORY_CARE_PROVIDER_SITE_OTHER): Payer: BLUE CROSS/BLUE SHIELD | Admitting: *Deleted

## 2018-04-09 DIAGNOSIS — J309 Allergic rhinitis, unspecified: Secondary | ICD-10-CM

## 2018-05-10 ENCOUNTER — Ambulatory Visit: Payer: BLUE CROSS/BLUE SHIELD | Admitting: Internal Medicine

## 2018-05-10 ENCOUNTER — Encounter: Payer: Self-pay | Admitting: Internal Medicine

## 2018-05-10 ENCOUNTER — Other Ambulatory Visit: Payer: Self-pay

## 2018-05-10 VITALS — BP 142/78 | HR 78 | Temp 97.5°F | Ht 63.0 in | Wt 132.3 lb

## 2018-05-10 DIAGNOSIS — M545 Low back pain: Secondary | ICD-10-CM

## 2018-05-10 DIAGNOSIS — R296 Repeated falls: Secondary | ICD-10-CM

## 2018-05-10 DIAGNOSIS — W19XXXD Unspecified fall, subsequent encounter: Secondary | ICD-10-CM

## 2018-05-10 DIAGNOSIS — G8929 Other chronic pain: Secondary | ICD-10-CM | POA: Diagnosis not present

## 2018-05-10 DIAGNOSIS — F422 Mixed obsessional thoughts and acts: Secondary | ICD-10-CM

## 2018-05-10 DIAGNOSIS — I10 Essential (primary) hypertension: Secondary | ICD-10-CM

## 2018-05-10 DIAGNOSIS — W19XXXA Unspecified fall, initial encounter: Secondary | ICD-10-CM | POA: Insufficient documentation

## 2018-05-10 DIAGNOSIS — G4700 Insomnia, unspecified: Secondary | ICD-10-CM

## 2018-05-10 DIAGNOSIS — Z9181 History of falling: Secondary | ICD-10-CM | POA: Diagnosis not present

## 2018-05-10 DIAGNOSIS — Z79899 Other long term (current) drug therapy: Secondary | ICD-10-CM

## 2018-05-10 DIAGNOSIS — F429 Obsessive-compulsive disorder, unspecified: Secondary | ICD-10-CM

## 2018-05-10 DIAGNOSIS — Z973 Presence of spectacles and contact lenses: Secondary | ICD-10-CM

## 2018-05-10 HISTORY — DX: Unspecified fall, initial encounter: W19.XXXA

## 2018-05-10 HISTORY — DX: Repeated falls: R29.6

## 2018-05-10 MED ORDER — HYDROCHLOROTHIAZIDE 25 MG PO TABS: 12.5000 mg | ORAL_TABLET | Freq: Every day | ORAL | 4 refills | Status: DC

## 2018-05-10 MED ORDER — PAROXETINE HCL 20 MG PO TABS: 40.0000 mg | ORAL_TABLET | Freq: Every day | ORAL | 1 refills | Status: DC

## 2018-05-10 NOTE — Assessment & Plan Note (Signed)
BP Readings from Last 3 Encounters:  05/10/18 (!) 142/78  10/19/17 127/80  07/03/17 106/78   Elevated today but reports home BP measurements of 130s/70s. Denies heachache, chest pain, palpitations, sob.   Plan: - Continue HCTZ 12.53m. Advised to continue checking BP at home and call if BP is consistently >140/90.  - BMP today for yearly renal function.

## 2018-05-10 NOTE — Progress Notes (Signed)
   CC: falls   HPI:  Christina Burton is a 62 y.o. female with PMHx as listed below here to follow up HTN, insomnia, and frequent falls.   Please see encounter tab for full details of HPI  Past Medical History:  Diagnosis Date  . Allergic rhinitis   . Anxiety disorder   . Hypertension 04/24/2007   Qualifier: Diagnosis of  Problem Stop Reason:  By: Jerilee Hoh MD, Olam Idler    . Mitral valve prolapse   . Obsessive compulsive disorder   . Sinusitis    chronic  . Ulcerative colitis    sees Dr. Collene Mares - normal colonoscopy in Nov 2011    Physical Exam:  Vitals:   05/10/18 1529  BP: (!) 142/78  Pulse: 78  Temp: (!) 97.5 F (36.4 C)  TempSrc: Oral  SpO2: 97%  Weight: 132 lb 4.8 oz (60 kg)  Height: 5' 3"  (1.6 m)   Gen: Well appearing, NAD CV: RRR, no murmurs Pulm: Normal effort, CTA throughout, no wheezing Ext: Warm, no edema, normal joints Neuro: 5/5 lower extremity strength. Patellar and achilles reflexes 2+ and symmetric. No ataxia noted on gait assessment. Negative romberg. Sensation intact in bilateral lower extremities    Assessment & Plan:   See Encounters Tab for problem based charting.  Patient seen with Dr. Dareen Piano

## 2018-05-10 NOTE — Patient Instructions (Signed)
It was nice seeing you today. Thank you for choosing [facility] for your Primary Care.   Today we talked about:  1) Falls: Start doing strengthening exercises for your legs and follow up with your back doctor. I think you would benefit from physical therapy  2) We are checking your kidney function today. I will call you if the results are abnormal   FOLLOW-UP INSTRUCTIONS When: 1 year For: blood pressure What to bring: all medications   Please contact the clinic if you have any problems, or need to be seen sooner.   It is important to avoid accidents which may result in broken bones.  Here are a few ideas on how to make your home safer so you will be less likely to trip or fall.  1. Use nonskid mats or non slip strips in your shower or tub, on your bathroom floor and around sinks.  If you know that you have spilled water, wipe it up! 2. In the bathroom, it is important to have properly installed grab bars on the walls or on the edge of the tub.  Towel racks are NOT strong enough for you to hold onto or to pull on for support. 3. Stairs and hallways should have enough light.  Add lamps or night lights if you need ore light. 4. It is good to have handrails on both sides of the stairs if possible.  Always fix broken handrails right away. 5. It is important to see the edges of steps.  Paint the edges of outdoor steps white so you can see them better.  Put colored tape on the edge of inside steps. 6. Throw-rugs are dangerous because they can slide.  Removing the rugs is the best idea, but if they must stay, add adhesive carpet tape to prevent slipping. 7. Do not keep things on stairs or in the halls.  Remove small furniture that blocks the halls as it may cause you to trip.  Keep telephone and electrical cords out of the way where you walk. 8. Always were sturdy, rubber-soled shoes for good support.  Never wear just socks, especially on the stairs.  Socks may cause you to slip or fall.   Do not wear full-length housecoats as you can easily trip on the bottom.  9. Place the things you use the most on the shelves that are the easiest to reach.  If you use a stepstool, make sure it is in good condition.  If you feel unsteady, DO NOT climb, ask for help. 10. If a health professional advises you to use a cane or walker, do not be ashamed.  These items can keep you from falling and breaking your bones.

## 2018-05-10 NOTE — Assessment & Plan Note (Signed)
Reports issues staying asleep for the last 25 years. Some improvement with melatonin 5-10mg nightly. She is waking up 1-2 times a night and feels tired throughout the day. Unsure if she snores as she sleeps alone. Denies waking up gasping for air. She practices good sleep hygiene and eats well and walks for exercise. We discussed sleep study but she would like to think about it.

## 2018-05-10 NOTE — Assessment & Plan Note (Signed)
Expresses concern over frequent falls. She fell last year while walking up a steep hill and then fell again a couple months ago at home when tripping over the threshold of a doorway. She denies associated dizziness, lightheadedness, chest pain, palpitations, sob. She does have chronic low back pain and endorses "heavy legs" when walking up stairs. She wears glasses and is due for a vision check soon. Most likely mechanical falls. Previous Vit  D and B12 were normal. Hemoglobin is normal. Neuro exam and gait testing is unremarkable.   Plan: - I offered PT referral but she declined. She would like to follow up with her orthopedic doctor first - I recommended lower extremity strength exercises and balances exercises

## 2018-05-10 NOTE — Assessment & Plan Note (Signed)
Reports good response and tolerance of paroxetine. Refilled paroxetine.

## 2018-05-11 LAB — BMP8+ANION GAP
ANION GAP: 17 mmol/L (ref 10.0–18.0)
BUN/Creatinine Ratio: 25 (ref 12–28)
BUN: 12 mg/dL (ref 8–27)
CO2: 26 mmol/L (ref 20–29)
CREATININE: 0.48 mg/dL — AB (ref 0.57–1.00)
Calcium: 9.6 mg/dL (ref 8.7–10.3)
Chloride: 98 mmol/L (ref 96–106)
GFR calc Af Amer: 123 mL/min/{1.73_m2} (ref >59)
GFR, EST NON AFRICAN AMERICAN: 107 mL/min/{1.73_m2} (ref >59)
Glucose: 92 mg/dL (ref 65–99)
Potassium: 3.6 mmol/L (ref 3.5–5.2)
Sodium: 141 mmol/L (ref 134–144)

## 2018-05-11 NOTE — Progress Notes (Signed)
Internal Medicine Clinic Attending  I saw and evaluated the patient.  I personally confirmed the key portions of the history and exam documented by Dr. Donne Hazel and I reviewed pertinent patient test results.  The assessment, diagnosis, and plan were formulated together and I agree with the documentation in the resident's note.

## 2018-05-11 NOTE — Addendum Note (Signed)
Addended by: Aldine Contes on: 05/11/2018 09:18 AM   Modules accepted: Level of Service

## 2018-05-14 ENCOUNTER — Ambulatory Visit (INDEPENDENT_AMBULATORY_CARE_PROVIDER_SITE_OTHER): Payer: BLUE CROSS/BLUE SHIELD | Admitting: *Deleted

## 2018-05-14 DIAGNOSIS — J309 Allergic rhinitis, unspecified: Secondary | ICD-10-CM | POA: Diagnosis not present

## 2018-06-06 DIAGNOSIS — M545 Low back pain: Secondary | ICD-10-CM | POA: Diagnosis not present

## 2018-06-06 DIAGNOSIS — M5416 Radiculopathy, lumbar region: Secondary | ICD-10-CM | POA: Diagnosis not present

## 2018-06-12 ENCOUNTER — Ambulatory Visit (INDEPENDENT_AMBULATORY_CARE_PROVIDER_SITE_OTHER): Payer: BLUE CROSS/BLUE SHIELD

## 2018-06-12 DIAGNOSIS — J309 Allergic rhinitis, unspecified: Secondary | ICD-10-CM | POA: Diagnosis not present

## 2018-06-20 DIAGNOSIS — Z1231 Encounter for screening mammogram for malignant neoplasm of breast: Secondary | ICD-10-CM | POA: Diagnosis not present

## 2018-06-22 DIAGNOSIS — Z1211 Encounter for screening for malignant neoplasm of colon: Secondary | ICD-10-CM | POA: Diagnosis not present

## 2018-06-22 DIAGNOSIS — K5190 Ulcerative colitis, unspecified, without complications: Secondary | ICD-10-CM | POA: Diagnosis not present

## 2018-07-05 DIAGNOSIS — M5136 Other intervertebral disc degeneration, lumbar region: Secondary | ICD-10-CM | POA: Diagnosis not present

## 2018-07-09 ENCOUNTER — Ambulatory Visit (INDEPENDENT_AMBULATORY_CARE_PROVIDER_SITE_OTHER): Payer: BLUE CROSS/BLUE SHIELD

## 2018-07-09 DIAGNOSIS — J309 Allergic rhinitis, unspecified: Secondary | ICD-10-CM | POA: Diagnosis not present

## 2018-07-17 ENCOUNTER — Telehealth: Payer: Self-pay | Admitting: *Deleted

## 2018-07-17 MED ORDER — BECLOMETHASONE DIPROP HFA 80 MCG/ACT IN AERB: 2.0000 | INHALATION_SPRAY | Freq: Two times a day (BID) | RESPIRATORY_TRACT | 0 refills | Status: DC

## 2018-07-17 NOTE — Telephone Encounter (Signed)
Courtesy refill sent in patient advised she needs to make appt for further refills since it has been over 1 yr. Patient verbalized understanding

## 2018-07-17 NOTE — Telephone Encounter (Signed)
Patient called requesting refill on qbar. [facility] on [address] and [address]

## 2018-07-19 DIAGNOSIS — M533 Sacrococcygeal disorders, not elsewhere classified: Secondary | ICD-10-CM | POA: Diagnosis not present

## 2018-07-19 NOTE — Progress Notes (Signed)
Exp 07/20/19

## 2018-07-23 DIAGNOSIS — J3089 Other allergic rhinitis: Secondary | ICD-10-CM | POA: Diagnosis not present

## 2018-08-06 DIAGNOSIS — M542 Cervicalgia: Secondary | ICD-10-CM | POA: Diagnosis not present

## 2018-08-06 DIAGNOSIS — M25511 Pain in right shoulder: Secondary | ICD-10-CM | POA: Diagnosis not present

## 2018-08-06 DIAGNOSIS — M65331 Trigger finger, right middle finger: Secondary | ICD-10-CM | POA: Diagnosis not present

## 2018-08-06 DIAGNOSIS — M25521 Pain in right elbow: Secondary | ICD-10-CM | POA: Diagnosis not present

## 2018-08-10 ENCOUNTER — Ambulatory Visit (INDEPENDENT_AMBULATORY_CARE_PROVIDER_SITE_OTHER): Payer: BLUE CROSS/BLUE SHIELD | Admitting: *Deleted

## 2018-08-10 DIAGNOSIS — J309 Allergic rhinitis, unspecified: Secondary | ICD-10-CM | POA: Diagnosis not present

## 2018-08-17 ENCOUNTER — Other Ambulatory Visit: Payer: Self-pay | Admitting: Allergy and Immunology

## 2018-08-21 DIAGNOSIS — M533 Sacrococcygeal disorders, not elsewhere classified: Secondary | ICD-10-CM | POA: Diagnosis not present

## 2018-09-04 ENCOUNTER — Ambulatory Visit: Payer: BLUE CROSS/BLUE SHIELD | Admitting: Allergy and Immunology

## 2018-09-04 ENCOUNTER — Encounter: Payer: Self-pay | Admitting: Allergy and Immunology

## 2018-09-04 ENCOUNTER — Ambulatory Visit: Payer: Self-pay

## 2018-09-04 VITALS — BP 108/70 | HR 84 | Resp 18 | Ht 61.5 in | Wt 139.0 lb

## 2018-09-04 DIAGNOSIS — G4720 Circadian rhythm sleep disorder, unspecified type: Secondary | ICD-10-CM

## 2018-09-04 DIAGNOSIS — J4530 Mild persistent asthma, uncomplicated: Secondary | ICD-10-CM

## 2018-09-04 DIAGNOSIS — K219 Gastro-esophageal reflux disease without esophagitis: Secondary | ICD-10-CM

## 2018-09-04 DIAGNOSIS — J3089 Other allergic rhinitis: Secondary | ICD-10-CM | POA: Diagnosis not present

## 2018-09-04 DIAGNOSIS — J309 Allergic rhinitis, unspecified: Secondary | ICD-10-CM

## 2018-09-04 MED ORDER — BECLOMETHASONE DIPROP HFA 80 MCG/ACT IN AERB: 2.0000 | INHALATION_SPRAY | Freq: Two times a day (BID) | RESPIRATORY_TRACT | 11 refills | Status: DC

## 2018-09-04 MED ORDER — AZELASTINE HCL 0.1 % NA SOLN: NASAL | 11 refills | Status: DC

## 2018-09-04 MED ORDER — ALBUTEROL SULFATE HFA 108 (90 BASE) MCG/ACT IN AERS: 2.0000 | INHALATION_SPRAY | RESPIRATORY_TRACT | 1 refills | Status: DC | PRN

## 2018-09-04 MED ORDER — OMEPRAZOLE 20 MG PO CPDR: 20.0000 mg | DELAYED_RELEASE_CAPSULE | Freq: Two times a day (BID) | ORAL | 11 refills | Status: DC

## 2018-09-04 MED ORDER — MOMETASONE FUROATE 50 MCG/ACT NA SUSP: NASAL | 11 refills | Status: DC

## 2018-09-04 MED ORDER — CYPROHEPTADINE HCL 4 MG PO TABS: ORAL_TABLET | ORAL | 11 refills | Status: DC

## 2018-09-04 NOTE — Progress Notes (Signed)
Follow-up Note  Referring Provider: Isabelle Course, MD Primary Provider: Isabelle Course, MD Date of Office Visit: 09/04/2018  Subjective:   Christina Burton (DOB: 1955-10-26) is a 63 y.o. female who returns to the [facility] on 09/04/2018 in re-evaluation of the following:  HPI: Christina Burton returns to this clinic in reevaluation of her asthma and allergic rhinoconjunctivitis and LPR and sleep dysfunction.  Her last visit to this clinic was 02 May 2017.  Her asthma has been under excellent control with the use of Qvar mostly 1 time per day.  Rarely does she use a short acting bronchodilator.  She has not required a systemic steroid or an antibiotic to treat any type of airway issue.  She can exercise without any problem.  Her nose is doing quite well.  She does not appear to have any significant issues with her upper airways while using Nasonex 1- 3 times a week.  Reflux is under excellent control while using her omeprazole twice a day.  Her sleep dysfunction is still an active issue.  She did only use cyproheptadine on the weekends because it does make her somewhat tired the next day.  She does sleep very well when using cyproheptadine.  Her immunotherapy is currently going well at every 4 weeks without any adverse effect.  She did obtain a flu vaccine this year.  Allergies as of 09/04/2018      Reactions   Amoxicillin Hives   Articaine    Caused numbness that too long - lasted about 6 months when used by dentist   Atenolol Hives   Sulfonamide Derivatives    REACTION: Unknown reaction   Trimox [amoxicillin Trihydrate]       Medication List      acetaminophen 500 MG tablet Commonly known as:  TYLENOL Take 500 mg by mouth every 6 (six) hours as needed. For pain   albuterol 108 (90 Base) MCG/ACT inhaler Commonly known as:  PROAIR HFA Inhale 2 puffs into the lungs every 4 (four) hours as needed for wheezing or shortness of breath.   azelastine 0.1 % nasal  spray Commonly known as:  ASTELIN USE ONE SPRAY IN EACH NOSTRIL TWICE DAILY IF NEEDED   beclomethasone 80 MCG/ACT inhaler Commonly known as:  QVAR REDIHALER Inhale 2 puffs into the lungs 2 (two) times daily.   CALCIUM PO Take by mouth daily.   CANASA 1000 MG suppository Generic drug:  mesalamine Place 1,000 mg rectally daily as needed. Use as directed by your gastroenterologist for ulcerative colitis   mesalamine 1.2 g EC tablet Commonly known as:  LIALDA Take 2.4 g by mouth daily.   cetirizine 10 MG tablet Commonly known as:  ZYRTEC Take 10 mg by mouth daily.   cyproheptadine 4 MG tablet Commonly known as:  PERIACTIN TAKE ONE-HALF TO ONE TABLET AT BEDTIME   hydrochlorothiazide 25 MG tablet Commonly known as:  HYDRODIURIL Take 0.5 tablets (12.5 mg total) by mouth daily.   Melatonin 10 MG Tabs Take 10 mg by mouth at bedtime as needed (insomnia). Take 2-3 hours before bedtime.   mometasone 50 MCG/ACT nasal spray Commonly known as:  NASONEX USE ONE SPRAY IN EACH NOSTRIL TWICE DAILY   multivitamin with minerals Tabs tablet Take 1 tablet by mouth daily.   omeprazole 20 MG capsule Commonly known as:  PRILOSEC Take 1 capsule (20 mg total) by mouth 2 (two) times daily.   PARoxetine 20 MG tablet Commonly known as:  PAXIL Take  2 tablets (40 mg total) by mouth daily.   pravastatin 20 MG tablet Commonly known as:  PRAVACHOL Take 1 tablet (20 mg total) by mouth every morning.   VITAMIN C PO Take 1 tablet by mouth daily.   VITAMIN D PO Take by mouth daily.       Past Medical History:  Diagnosis Date  . Allergic rhinitis   . Anxiety disorder   . Hypertension 04/24/2007   Qualifier: Diagnosis of  Problem Stop Reason:  By: Jerilee Hoh MD, Olam Idler    . Mitral valve prolapse   . Obsessive compulsive disorder   . Sinusitis    chronic  . Ulcerative colitis    sees Dr. Collene Mares - normal colonoscopy in Nov 2011    Past Surgical History:  Procedure Laterality Date  .  CARPAL TUNNEL RELEASE    . HERNIA REPAIR    . TONSILLECTOMY AND ADENOIDECTOMY    . TRIGGER FINGER RELEASE     right thumb    Review of systems negative except as noted in HPI / PMHx or noted below:  Review of Systems  Constitutional: Negative.   HENT: Negative.   Eyes: Negative.   Respiratory: Negative.   Cardiovascular: Negative.   Gastrointestinal: Negative.   Genitourinary: Negative.   Musculoskeletal: Negative.   Skin: Negative.   Neurological: Negative.   Endo/Heme/Allergies: Negative.   Psychiatric/Behavioral: Negative.      Objective:   Vitals:   09/04/18 1654  BP: 108/70  Pulse: 84  Resp: 18   Height: 5' 1.5" (156.2 cm)  Weight: 139 lb (63 kg)   Physical Exam Constitutional:      Appearance: She is not diaphoretic.  HENT:     Head: Normocephalic.     Right Ear: Tympanic membrane, ear canal and external ear normal.     Left Ear: Tympanic membrane, ear canal and external ear normal.     Nose: Nose normal. No mucosal edema or rhinorrhea.     Mouth/Throat:     Pharynx: Uvula midline. No oropharyngeal exudate.  Eyes:     Conjunctiva/sclera: Conjunctivae normal.  Neck:     Thyroid: No thyromegaly.     Trachea: Trachea normal. No tracheal tenderness or tracheal deviation.  Cardiovascular:     Rate and Rhythm: Normal rate and regular rhythm.     Heart sounds: Normal heart sounds, S1 normal and S2 normal. No murmur.  Pulmonary:     Effort: No respiratory distress.     Breath sounds: Normal breath sounds. No stridor. No wheezing or rales.  Lymphadenopathy:     Head:     Right side of head: No tonsillar adenopathy.     Left side of head: No tonsillar adenopathy.     Cervical: No cervical adenopathy.  Skin:    Findings: No erythema or rash.     Nails: There is no clubbing.   Neurological:     Mental Status: She is alert.     Diagnostics:    Spirometry was performed and demonstrated an FEV1 of 2.25 at 96 % of predicted.  The patient had an Asthma  Control Test with the following results: ACT Total Score: 21.    Assessment and Plan:   1. Asthma, well controlled, mild persistent   2. Other allergic rhinitis   3. LPRD (laryngopharyngeal reflux disease)   4. Dysfunction of sleep stage or arousal   5. Gastroesophageal reflux disease, esophagitis presence not specified      1. Continue Qvar 80 REDIHALER 2 inhalations  1-2 times a day depending on disease activity  2. Continue Nasonex one spray each nostril 3-7 times per week depending on disease activity  3. Continue immunotherapy and EpiPen  4. Continue omeprazole 20 mg twice a day  5. Continue Cyproheptadine 28m one half to one tablet at bedtime  6. Continue nasal Astelin, nasal saline, Zyrtec, Proventil HFA, and Zaditor if needed  7. Obtain fall flu vaccine  8. Return to clinic in 1 year or earlier if problem  NInnaappears to be doing pretty well on her current therapy which is directed against respiratory tract inflammation and also reflux and sleep dysfunction.  I am not really going to change any of her therapy at this point and she will continue on a combination of anti-inflammatory agents for her airway, immunotherapy, proton pump inhibitors for reflux, and cyproheptadine at nighttime.  I will see her back in this clinic in 1 year or earlier if there is a problem.  EAllena Katz MD Allergy / Immunology [place]

## 2018-09-04 NOTE — Patient Instructions (Addendum)
  1. Continue Qvar 80 REDIHALER 2 inhalations 1-2 times a day depending on disease activity  2. Continue Nasonex one spray each nostril 3-7 times per week depending on disease activity  3. Continue immunotherapy and EpiPen  4. Continue omeprazole 20 mg twice a day  5. Continue Cyproheptadine 7m one half to one tablet at bedtime  6. Continue nasal Astelin, nasal saline, Zyrtec, Proventil HFA, and Zaditor if needed  7. Obtain fall flu vaccine  8. Return to clinic in 1 year or earlier if problem

## 2018-09-05 ENCOUNTER — Encounter: Payer: Self-pay | Admitting: Allergy and Immunology

## 2018-09-05 ENCOUNTER — Telehealth: Payer: Self-pay

## 2018-09-05 NOTE — Telephone Encounter (Signed)
Mometasone PA initiated through [email or web address] waiting for approval

## 2018-09-07 NOTE — Telephone Encounter (Signed)
PA still pending.  

## 2018-09-10 ENCOUNTER — Telehealth: Payer: Self-pay | Admitting: *Deleted

## 2018-09-10 MED ORDER — FLUTICASONE PROPIONATE 50 MCG/ACT NA SUSP: 2.0000 | Freq: Every day | NASAL | 5 refills | Status: DC

## 2018-09-10 NOTE — Telephone Encounter (Signed)
PA has been denied for Mometasone Furoate. Flunisolide and Fluticasone are preferred. Please advise.

## 2018-09-10 NOTE — Telephone Encounter (Signed)
Please inform patient that insurance will switch her from mometasone to fluticasone.

## 2018-09-10 NOTE — Telephone Encounter (Signed)
Mometasone switched to Fluticasone nasal spray due to insurance coverage. Pharmacy notified of change of medication.

## 2018-09-25 ENCOUNTER — Encounter: Payer: Self-pay | Admitting: Internal Medicine

## 2018-09-25 ENCOUNTER — Ambulatory Visit: Payer: BLUE CROSS/BLUE SHIELD | Admitting: Internal Medicine

## 2018-09-25 ENCOUNTER — Other Ambulatory Visit: Payer: Self-pay

## 2018-09-25 DIAGNOSIS — J069 Acute upper respiratory infection, unspecified: Secondary | ICD-10-CM | POA: Diagnosis not present

## 2018-09-25 HISTORY — DX: Acute upper respiratory infection, unspecified: J06.9

## 2018-09-25 MED ORDER — BENZONATATE 100 MG PO CAPS: 100.0000 mg | ORAL_CAPSULE | Freq: Three times a day (TID) | ORAL | 0 refills | Status: DC | PRN

## 2018-09-25 NOTE — Patient Instructions (Signed)
Christina Burton,  I am sorry to hear you have not been feeling well. I think you likely have an upper respiratory infection from a virus. Continue to use your flonase. This infection should resolve on it's own. If you do not get better, please come back for evaluation. If you have any questions or concerns, call our clinic at [phone] or after hours call [phone] and ask for the internal medicine resident on call. Thank you!  Dr. Philipp Ovens

## 2018-09-25 NOTE — Progress Notes (Signed)
   CC: Concern for influenza infection  HPI:  [name] is a 63 y.o. female with past medical history outlined below here with concern for influenza infection. For the details of today's visit, please refer to the assessment and plan.  Past Medical History:  Diagnosis Date  . Allergic rhinitis   . Anxiety disorder   . Hypertension 04/24/2007   Qualifier: Diagnosis of  Problem Stop Reason:  By: Jerilee Hoh MD, Olam Idler    . Mitral valve prolapse   . Obsessive compulsive disorder   . Sinusitis    chronic  . Ulcerative colitis    sees Dr. Collene Mares - normal colonoscopy in Nov 2011    ROS  Physical Exam:  Vitals:   09/25/18 1520  BP: (!) 141/84  Pulse: 88  Temp: 98 F (36.7 C)  TempSrc: Oral  SpO2: 96%  Weight: 138 lb 14.4 oz (63 kg)  Height: 5' 3"  (1.6 m)    Constitutional: NAD, appears comfortable Cardiovascular: RRR, no m/r/g Pulmonary/Chest: CTAB, no wheezes, rales, or rhonchi.  Extremities: Warm and well perfused. No edema.  Psychiatric: Normal mood and affect  Assessment & Plan:   See Encounters Tab for problem based charting.  Patient discussed with Dr. Evette Doffing

## 2018-09-26 ENCOUNTER — Encounter: Payer: Self-pay | Admitting: Internal Medicine

## 2018-09-26 NOTE — Assessment & Plan Note (Signed)
Patient is here with complaint of cough, congestion, and runny nose. Symptoms have been on going now for 6 days. She is worried she has the flu. She denies fevers, chills, body aches, N/V/D. She received her flu shot this year. Discussed likely diagnosis of viral URI. Doubt influenza infection. Regardless would be outside the treatment window. Recommended supportive care. Flonase for congestion and tessalon perles for cough. Work note provided.

## 2018-09-27 NOTE — Progress Notes (Signed)
Internal Medicine Clinic Attending  Case discussed with Dr. Guilloud at the time of the visit.  We reviewed the resident's history and exam and pertinent patient test results.  I agree with the assessment, diagnosis, and plan of care documented in the resident's note.  

## 2018-10-04 DIAGNOSIS — H524 Presbyopia: Secondary | ICD-10-CM | POA: Diagnosis not present

## 2018-10-04 DIAGNOSIS — H25813 Combined forms of age-related cataract, bilateral: Secondary | ICD-10-CM | POA: Diagnosis not present

## 2018-10-04 DIAGNOSIS — H52203 Unspecified astigmatism, bilateral: Secondary | ICD-10-CM | POA: Diagnosis not present

## 2018-10-04 DIAGNOSIS — H5203 Hypermetropia, bilateral: Secondary | ICD-10-CM | POA: Diagnosis not present

## 2018-10-09 ENCOUNTER — Ambulatory Visit (INDEPENDENT_AMBULATORY_CARE_PROVIDER_SITE_OTHER): Payer: BLUE CROSS/BLUE SHIELD | Admitting: *Deleted

## 2018-10-09 DIAGNOSIS — J309 Allergic rhinitis, unspecified: Secondary | ICD-10-CM | POA: Diagnosis not present

## 2018-10-16 ENCOUNTER — Other Ambulatory Visit: Payer: Self-pay | Admitting: Internal Medicine

## 2018-10-16 ENCOUNTER — Other Ambulatory Visit: Payer: Self-pay

## 2018-10-16 NOTE — Telephone Encounter (Signed)
Refill request sent to MD in another encounter.Despina Hidden Cassady3/17/20202:52 PM

## 2018-10-16 NOTE — Telephone Encounter (Signed)
pravastatin (PRAVACHOL) 20 MG tablet, Refill request @  [facility], [place] AT [place] [phone] (Phone) [phone] (Fax)

## 2018-11-12 ENCOUNTER — Other Ambulatory Visit: Payer: Self-pay

## 2018-11-12 ENCOUNTER — Ambulatory Visit (INDEPENDENT_AMBULATORY_CARE_PROVIDER_SITE_OTHER): Payer: BLUE CROSS/BLUE SHIELD

## 2018-11-12 DIAGNOSIS — J309 Allergic rhinitis, unspecified: Secondary | ICD-10-CM

## 2018-11-12 MED ORDER — EPINEPHRINE 0.3 MG/0.3ML IJ SOAJ: 0.3000 mg | Freq: Once | INTRAMUSCULAR | 1 refills | Status: AC

## 2018-11-21 ENCOUNTER — Ambulatory Visit (INDEPENDENT_AMBULATORY_CARE_PROVIDER_SITE_OTHER): Payer: BLUE CROSS/BLUE SHIELD | Admitting: *Deleted

## 2018-11-21 DIAGNOSIS — J309 Allergic rhinitis, unspecified: Secondary | ICD-10-CM | POA: Diagnosis not present

## 2018-11-28 ENCOUNTER — Ambulatory Visit (INDEPENDENT_AMBULATORY_CARE_PROVIDER_SITE_OTHER): Payer: BLUE CROSS/BLUE SHIELD

## 2018-11-28 DIAGNOSIS — J309 Allergic rhinitis, unspecified: Secondary | ICD-10-CM | POA: Diagnosis not present

## 2018-12-05 ENCOUNTER — Ambulatory Visit (INDEPENDENT_AMBULATORY_CARE_PROVIDER_SITE_OTHER): Payer: BLUE CROSS/BLUE SHIELD | Admitting: *Deleted

## 2018-12-05 DIAGNOSIS — J309 Allergic rhinitis, unspecified: Secondary | ICD-10-CM

## 2018-12-12 ENCOUNTER — Ambulatory Visit (INDEPENDENT_AMBULATORY_CARE_PROVIDER_SITE_OTHER): Payer: BLUE CROSS/BLUE SHIELD | Admitting: *Deleted

## 2018-12-12 DIAGNOSIS — J309 Allergic rhinitis, unspecified: Secondary | ICD-10-CM | POA: Diagnosis not present

## 2018-12-20 DIAGNOSIS — M542 Cervicalgia: Secondary | ICD-10-CM | POA: Diagnosis not present

## 2018-12-20 DIAGNOSIS — M65331 Trigger finger, right middle finger: Secondary | ICD-10-CM | POA: Diagnosis not present

## 2018-12-20 DIAGNOSIS — M25521 Pain in right elbow: Secondary | ICD-10-CM | POA: Diagnosis not present

## 2018-12-20 DIAGNOSIS — M25511 Pain in right shoulder: Secondary | ICD-10-CM | POA: Diagnosis not present

## 2019-01-07 ENCOUNTER — Ambulatory Visit (INDEPENDENT_AMBULATORY_CARE_PROVIDER_SITE_OTHER): Payer: BLUE CROSS/BLUE SHIELD

## 2019-01-07 DIAGNOSIS — J309 Allergic rhinitis, unspecified: Secondary | ICD-10-CM

## 2019-01-19 ENCOUNTER — Encounter: Payer: Self-pay | Admitting: *Deleted

## 2019-01-24 ENCOUNTER — Other Ambulatory Visit: Payer: Self-pay | Admitting: Internal Medicine

## 2019-01-24 DIAGNOSIS — F422 Mixed obsessional thoughts and acts: Secondary | ICD-10-CM

## 2019-02-05 ENCOUNTER — Ambulatory Visit (INDEPENDENT_AMBULATORY_CARE_PROVIDER_SITE_OTHER): Payer: BLUE CROSS/BLUE SHIELD | Admitting: *Deleted

## 2019-02-05 DIAGNOSIS — J309 Allergic rhinitis, unspecified: Secondary | ICD-10-CM | POA: Diagnosis not present

## 2019-03-04 ENCOUNTER — Ambulatory Visit (INDEPENDENT_AMBULATORY_CARE_PROVIDER_SITE_OTHER): Payer: BLUE CROSS/BLUE SHIELD | Admitting: *Deleted

## 2019-03-04 DIAGNOSIS — J309 Allergic rhinitis, unspecified: Secondary | ICD-10-CM

## 2019-03-19 DIAGNOSIS — J3089 Other allergic rhinitis: Secondary | ICD-10-CM | POA: Diagnosis not present

## 2019-03-20 NOTE — Progress Notes (Signed)
VIALS EXP 03-19-20

## 2019-04-01 ENCOUNTER — Ambulatory Visit (INDEPENDENT_AMBULATORY_CARE_PROVIDER_SITE_OTHER): Payer: BLUE CROSS/BLUE SHIELD

## 2019-04-01 DIAGNOSIS — J309 Allergic rhinitis, unspecified: Secondary | ICD-10-CM | POA: Diagnosis not present

## 2019-04-02 DIAGNOSIS — K098 Other cysts of oral region, not elsewhere classified: Secondary | ICD-10-CM | POA: Diagnosis not present

## 2019-04-02 DIAGNOSIS — J342 Deviated nasal septum: Secondary | ICD-10-CM | POA: Diagnosis not present

## 2019-04-02 DIAGNOSIS — J302 Other seasonal allergic rhinitis: Secondary | ICD-10-CM | POA: Diagnosis not present

## 2019-04-02 DIAGNOSIS — J3489 Other specified disorders of nose and nasal sinuses: Secondary | ICD-10-CM | POA: Diagnosis not present

## 2019-04-11 DIAGNOSIS — L918 Other hypertrophic disorders of the skin: Secondary | ICD-10-CM | POA: Diagnosis not present

## 2019-04-11 DIAGNOSIS — D485 Neoplasm of uncertain behavior of skin: Secondary | ICD-10-CM | POA: Diagnosis not present

## 2019-04-11 DIAGNOSIS — L814 Other melanin hyperpigmentation: Secondary | ICD-10-CM | POA: Diagnosis not present

## 2019-04-11 DIAGNOSIS — D1801 Hemangioma of skin and subcutaneous tissue: Secondary | ICD-10-CM | POA: Diagnosis not present

## 2019-04-11 DIAGNOSIS — L820 Inflamed seborrheic keratosis: Secondary | ICD-10-CM | POA: Diagnosis not present

## 2019-05-02 ENCOUNTER — Ambulatory Visit (INDEPENDENT_AMBULATORY_CARE_PROVIDER_SITE_OTHER): Payer: BLUE CROSS/BLUE SHIELD | Admitting: *Deleted

## 2019-05-02 DIAGNOSIS — J309 Allergic rhinitis, unspecified: Secondary | ICD-10-CM | POA: Diagnosis not present

## 2019-05-29 ENCOUNTER — Ambulatory Visit (INDEPENDENT_AMBULATORY_CARE_PROVIDER_SITE_OTHER): Payer: BLUE CROSS/BLUE SHIELD

## 2019-05-29 DIAGNOSIS — J309 Allergic rhinitis, unspecified: Secondary | ICD-10-CM

## 2019-06-24 ENCOUNTER — Ambulatory Visit (INDEPENDENT_AMBULATORY_CARE_PROVIDER_SITE_OTHER): Payer: BLUE CROSS/BLUE SHIELD

## 2019-06-24 DIAGNOSIS — J309 Allergic rhinitis, unspecified: Secondary | ICD-10-CM | POA: Diagnosis not present

## 2019-06-24 DIAGNOSIS — Z1231 Encounter for screening mammogram for malignant neoplasm of breast: Secondary | ICD-10-CM | POA: Diagnosis not present

## 2019-07-02 ENCOUNTER — Ambulatory Visit (INDEPENDENT_AMBULATORY_CARE_PROVIDER_SITE_OTHER): Payer: BLUE CROSS/BLUE SHIELD | Admitting: *Deleted

## 2019-07-02 DIAGNOSIS — J309 Allergic rhinitis, unspecified: Secondary | ICD-10-CM | POA: Diagnosis not present

## 2019-07-03 IMAGING — CR DG KNEE COMPLETE 4+V*L*
4 series · 4 of 4 positions shown · non-contrast
Comparison: None.

CLINICAL DATA: Fall, knee pain.

EXAM:
LEFT KNEE - COMPLETE 4+ VIEW

[t knee ap left]
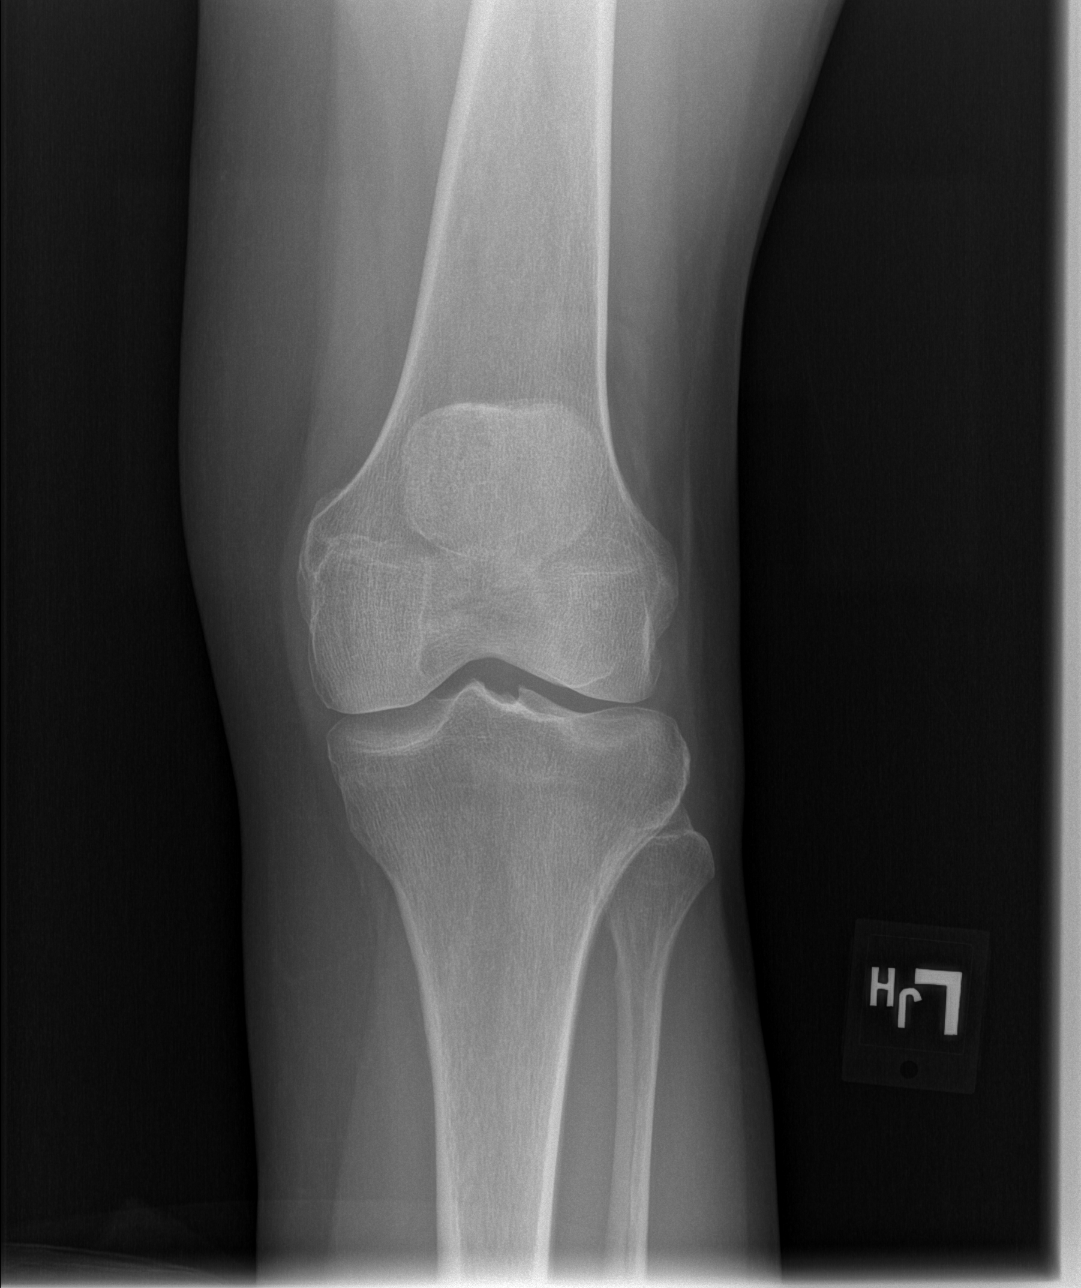

[t knee obl left (1 of 2)]
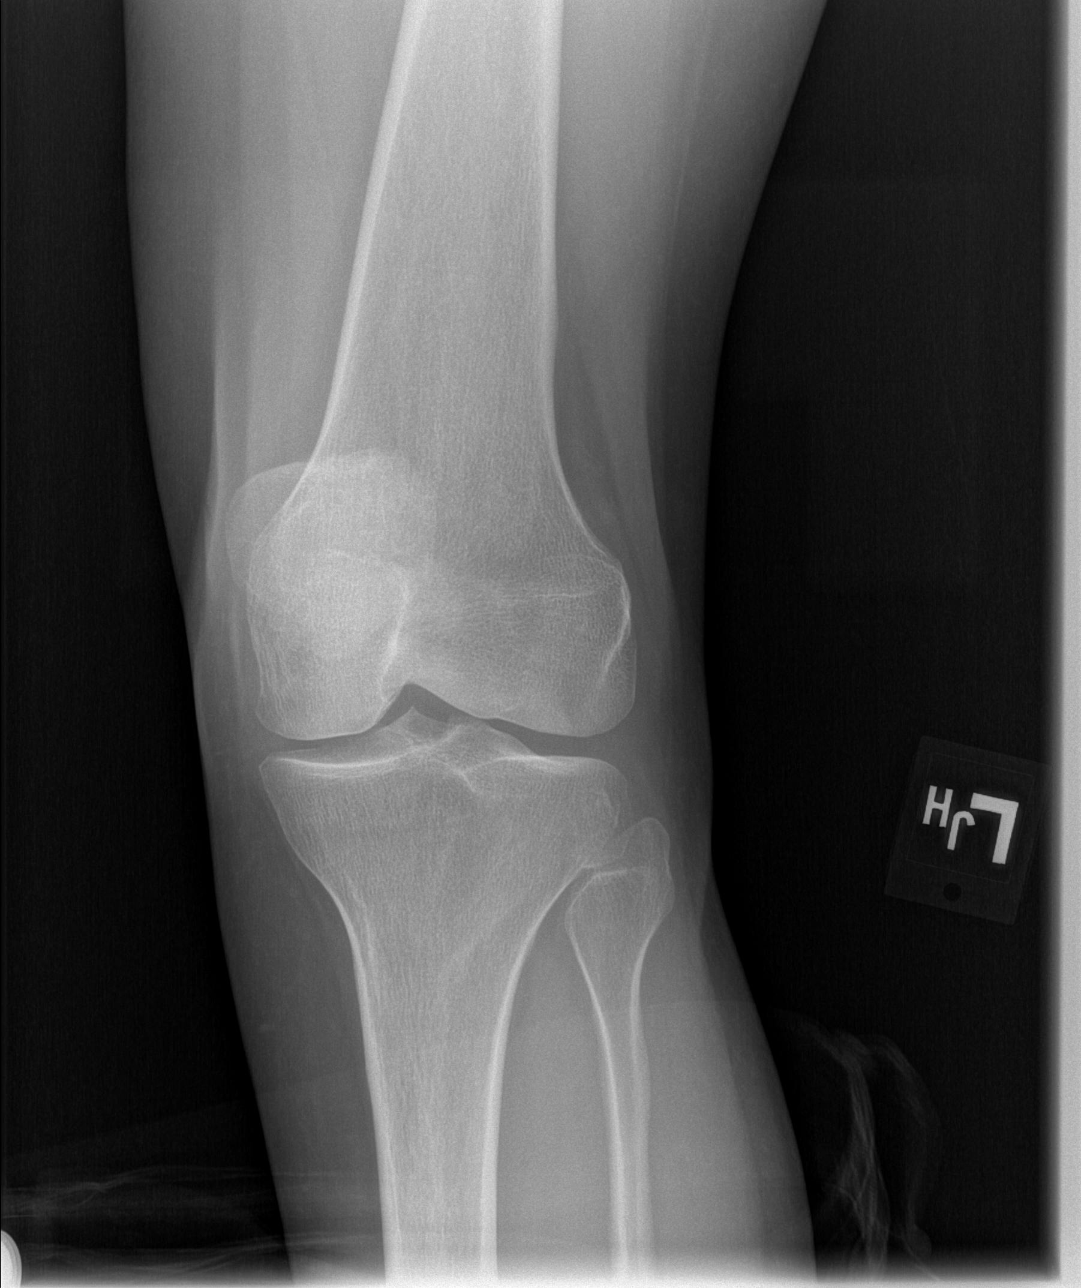

[t knee obl left (2 of 2)]
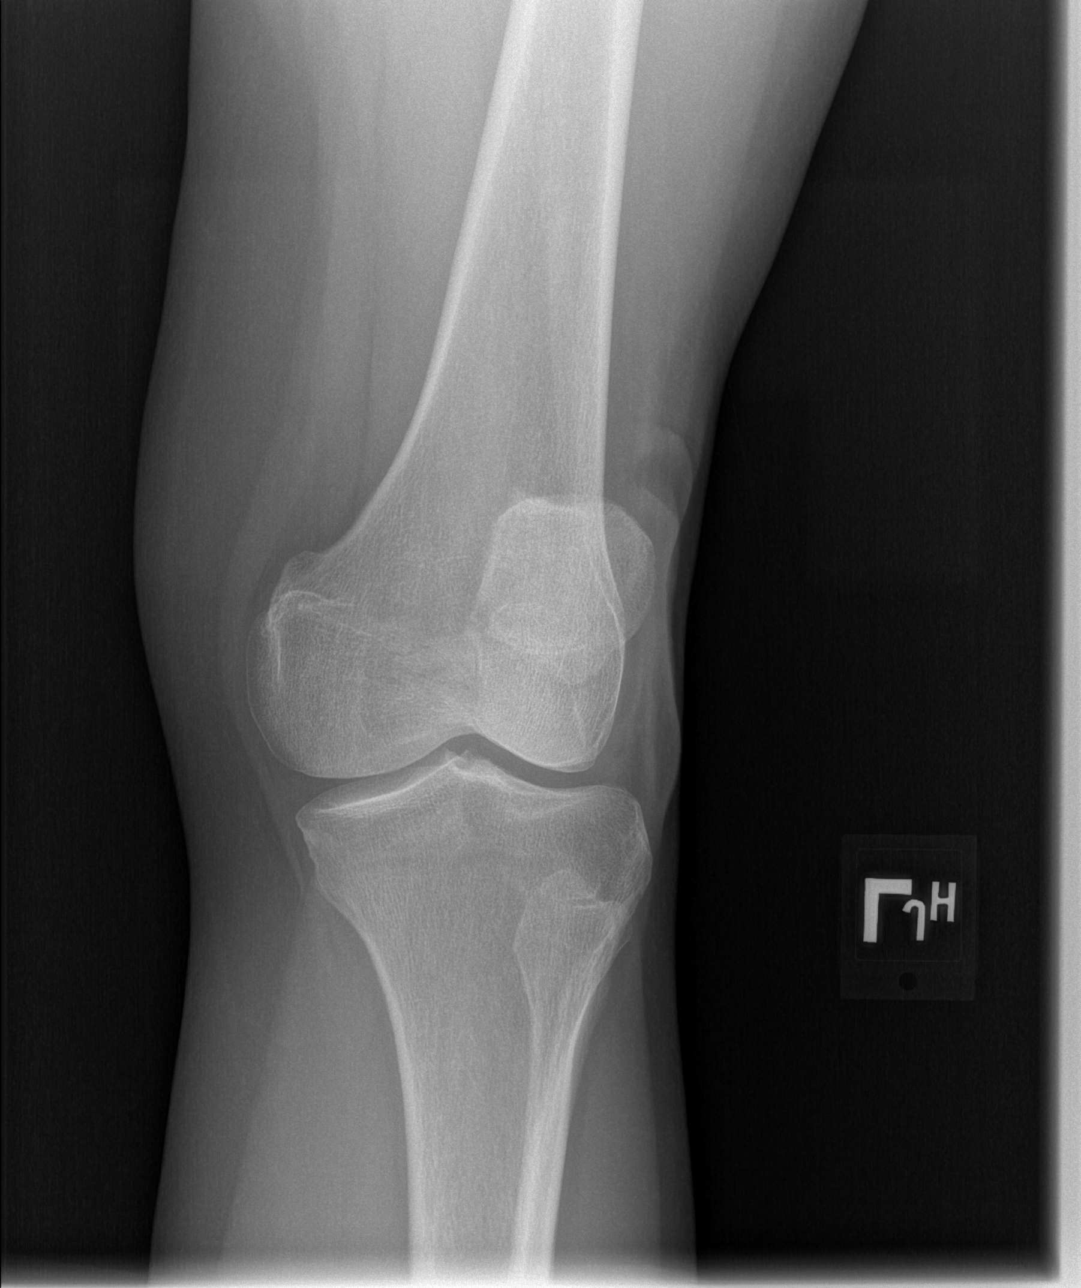

[t knee lat left]
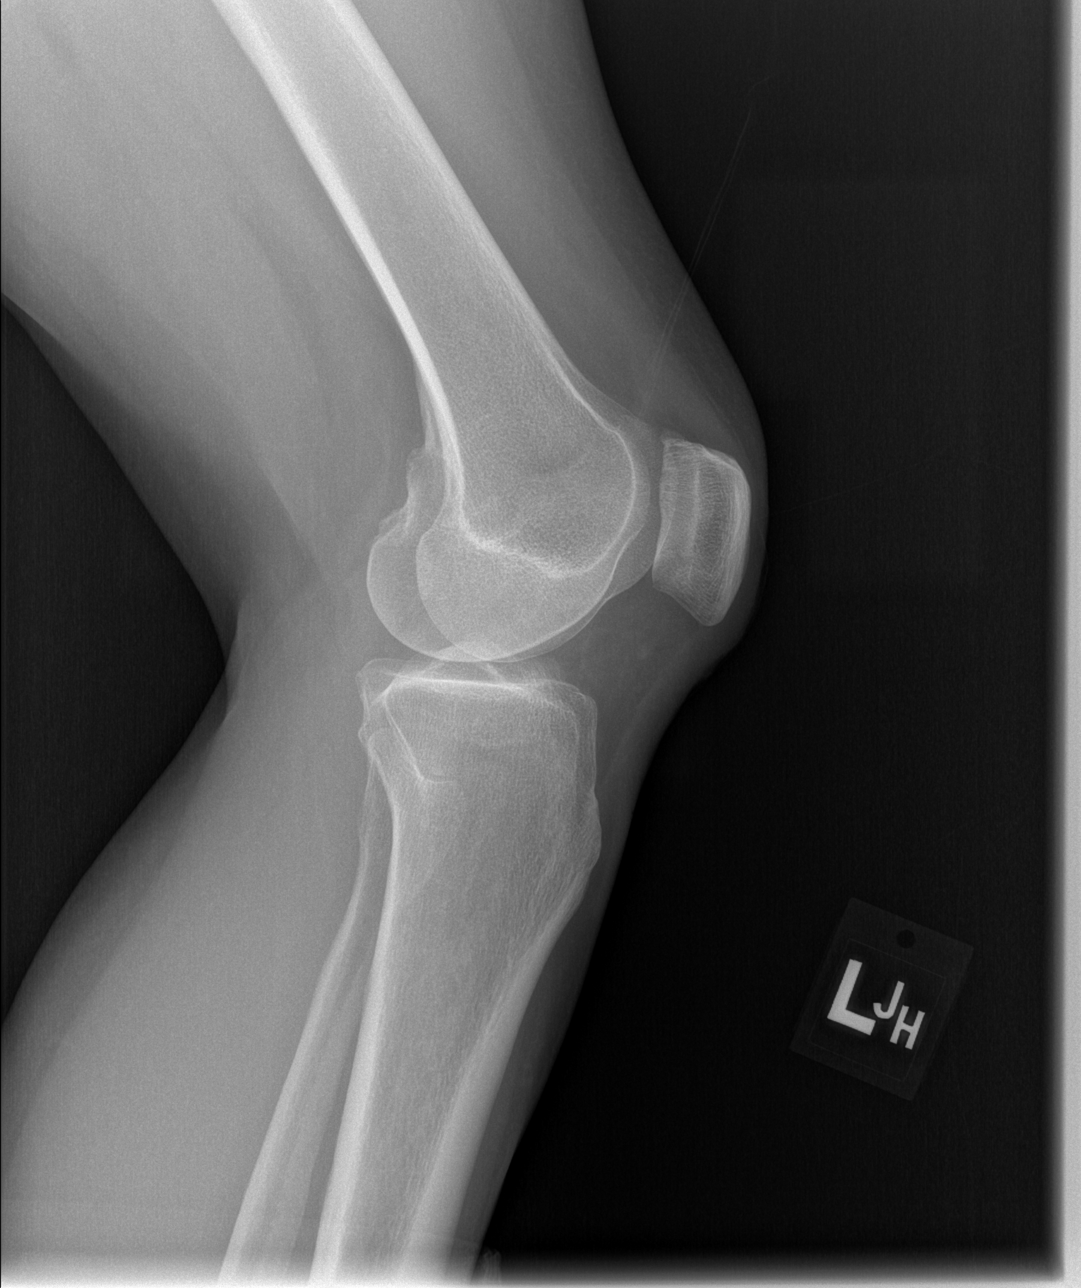

[4 of 4 positions shown; findings below may reference images not displayed]

FINDINGS: Osseous alignment is normal. Bone mineralization is normal. No
fracture line or displaced fracture fragment. No degenerative
change. No appreciable joint effusion. Adjacent soft tissues are
unremarkable.
IMPRESSION: Negative.

## 2019-07-03 IMAGING — CR DG LUMBAR SPINE COMPLETE 4+V
5 series · 5 of 5 positions shown · non-contrast
Comparison: None.

CLINICAL DATA: Low back pain after fall today.

EXAM:
LUMBAR SPINE - COMPLETE 4+ VIEW

[t lumbar spine obl (1 of 3)]
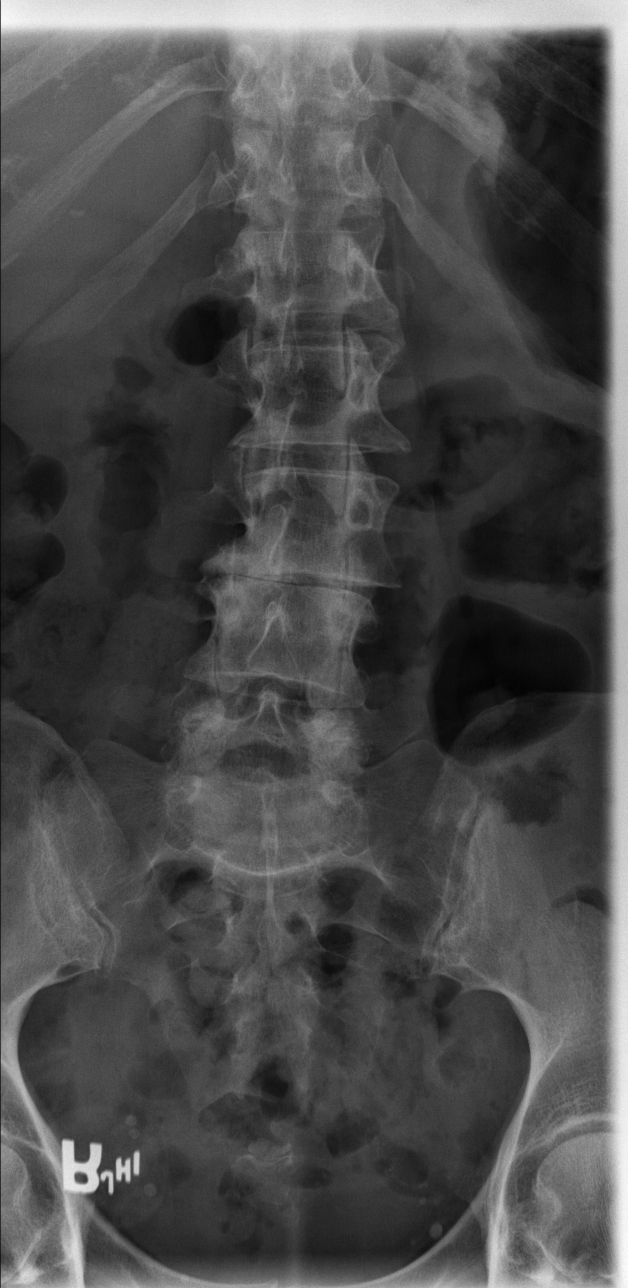

[t lumbar spine obl (2 of 3)]
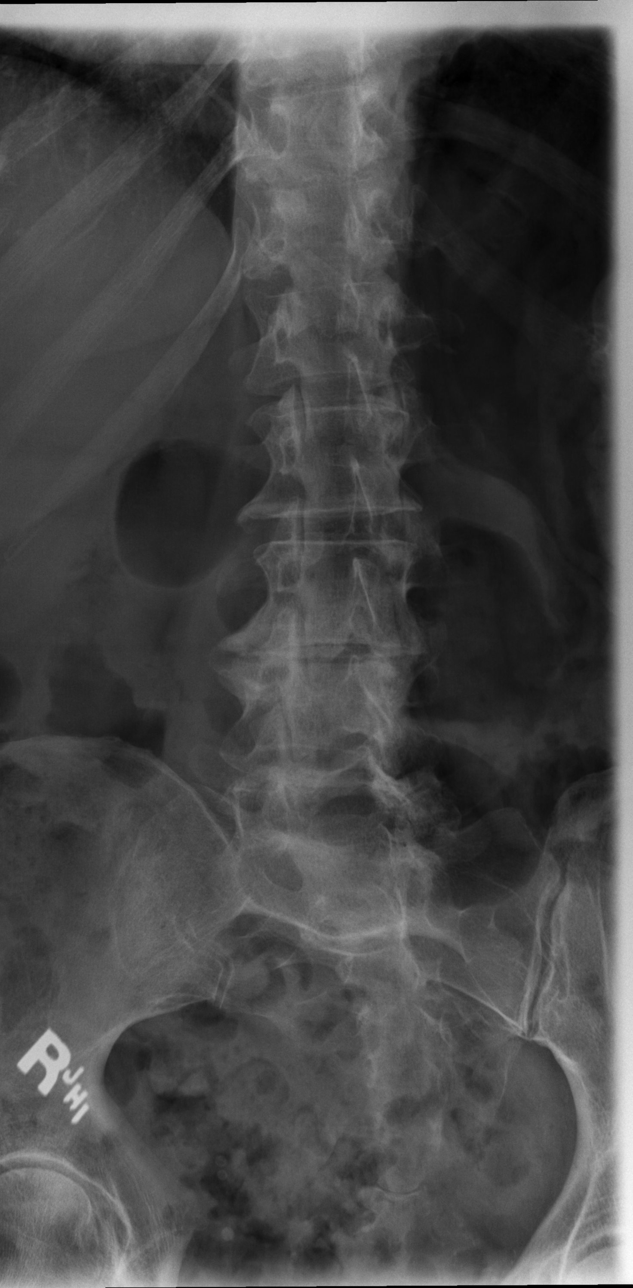

[t lumbar spine obl (3 of 3)]
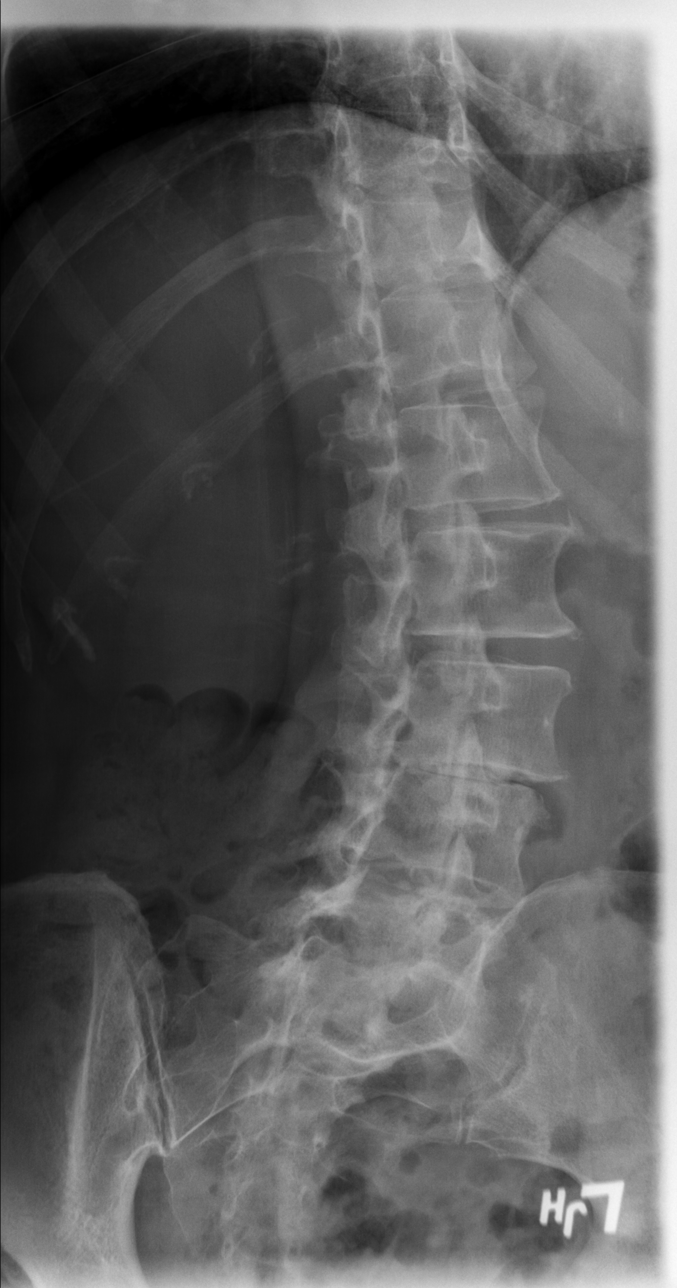

[t lumbar spine lat]
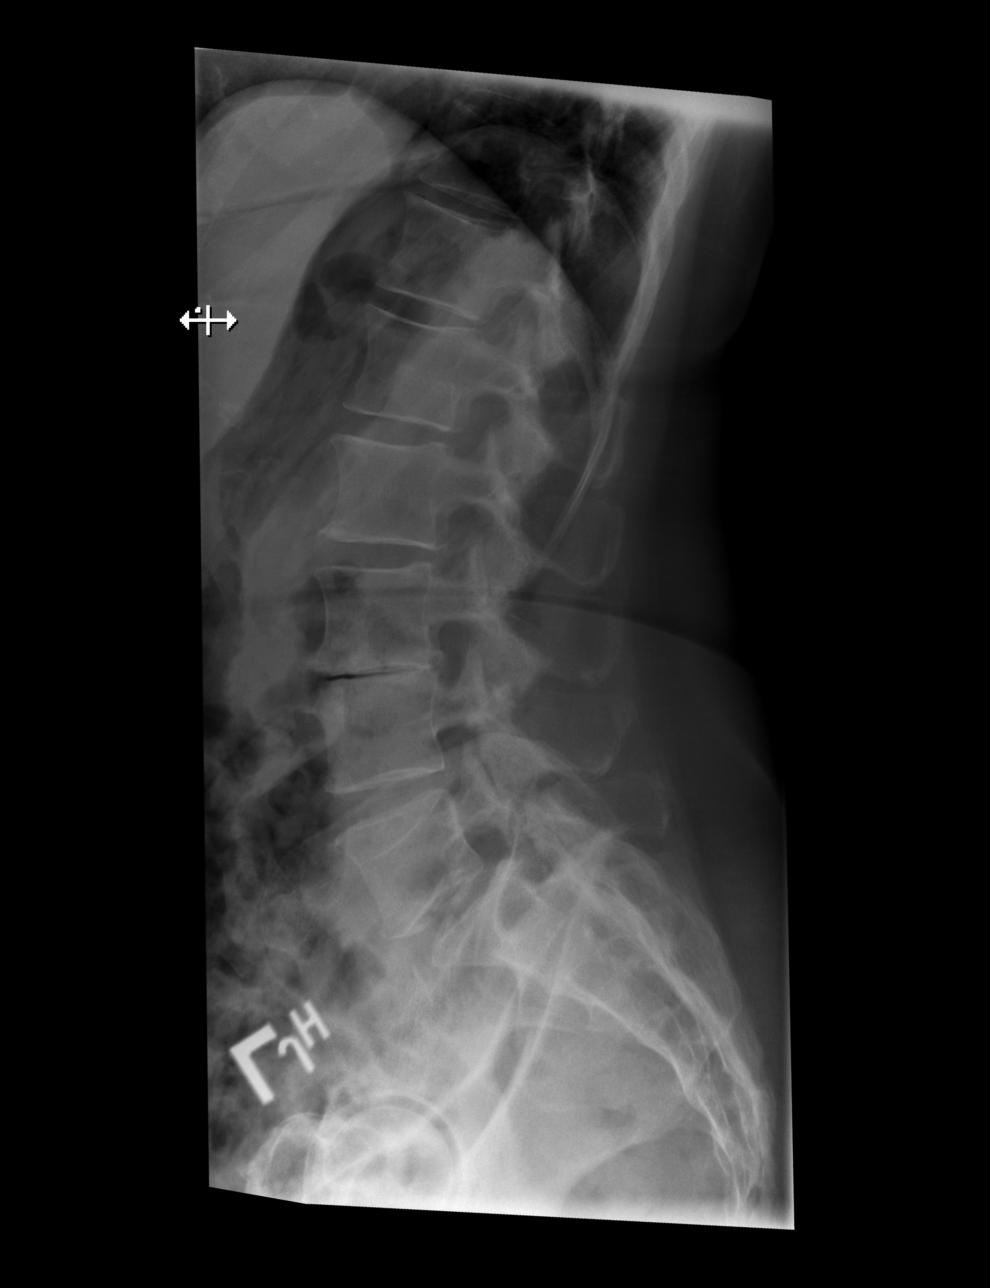

[t lumbar l-5 s-1 spot]
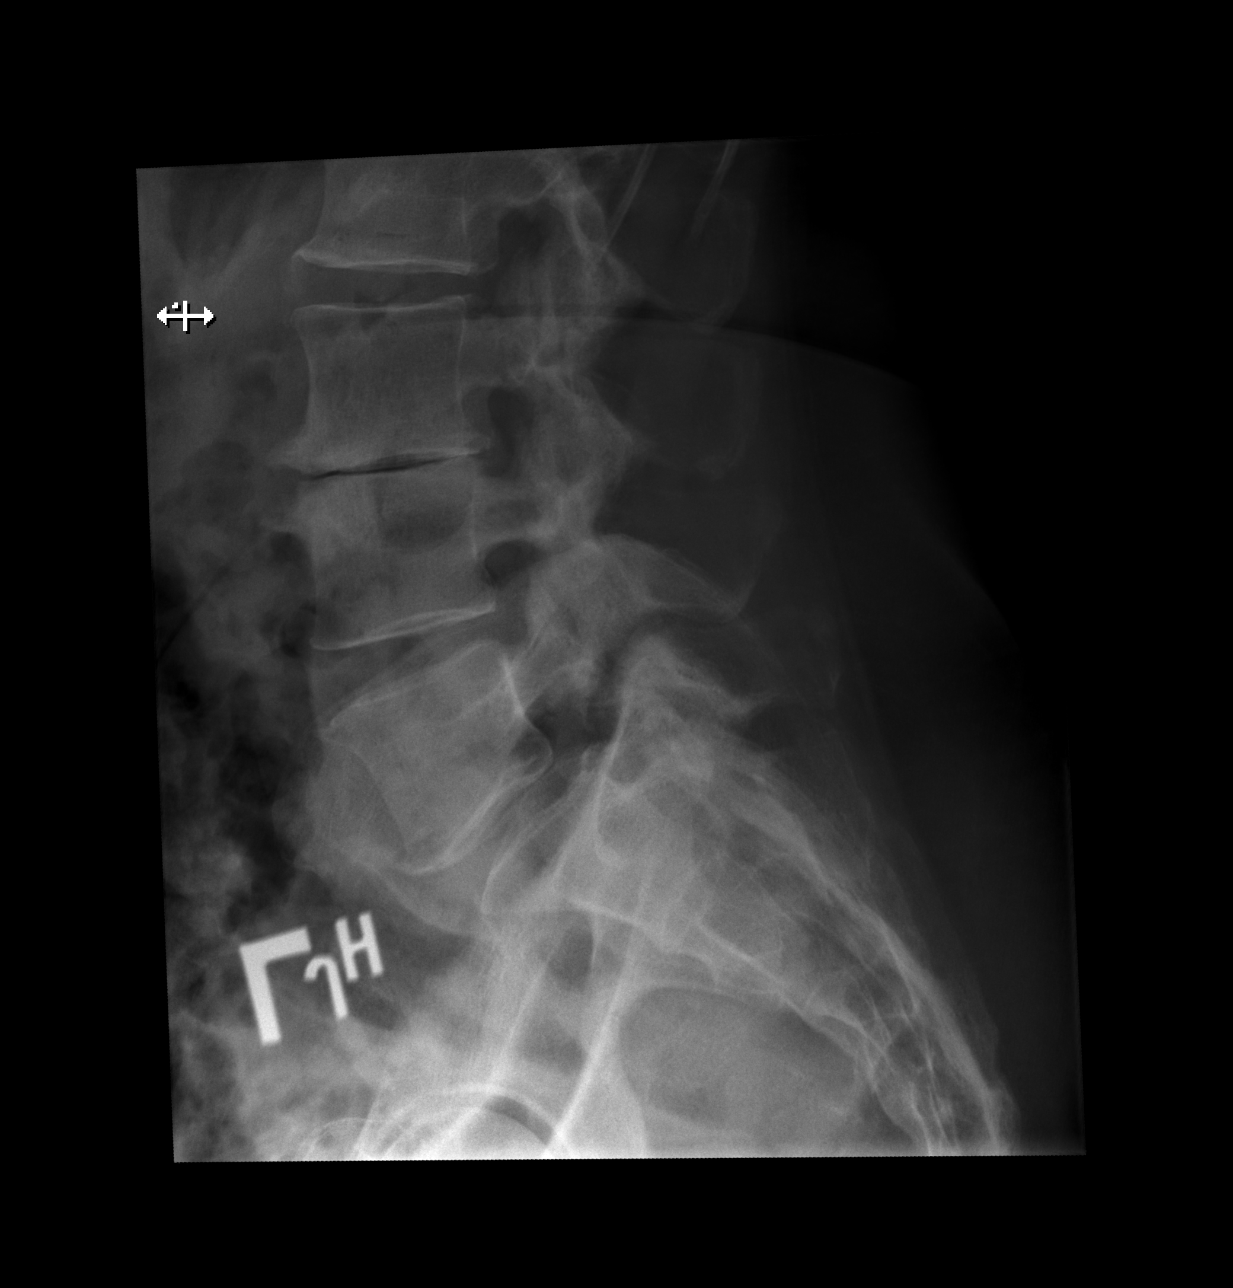

[5 of 5 positions shown; findings below may reference images not displayed]

FINDINGS: Mild grade 1 anterolisthesis of L5-S1 is noted secondary to
bilateral pars defects of L5. Severe degenerative disc disease is
noted at L3-4 with anterior and posterior osteophyte formation. No
acute fracture is noted. Mild lateral subluxation of L3 on L4 is
noted to the left.
IMPRESSION: Severe degenerative disc disease is noted at L3-4. Mild grade 1
anterolisthesis of L5-S1 secondary to bilateral L5 pars defects. No
acute abnormality seen.

## 2019-07-03 IMAGING — CT CT MAXILLOFACIAL W/O CM
3 series · 15 of 47 positions shown, 18 images · non-contrast
Comparison: CT facial bones dated 3776 P

CLINICAL DATA: Fall today, nasal pain and swelling, upper lip pain
and swelling with abrasions.

EXAM:
CT MAXILLOFACIAL WITHOUT CONTRAST
TECHNIQUE: Multidetector CT imaging of the maxillofacial structures was
performed. Multiplanar CT image reconstructions were also generated.

[Series 3: facial st · axial · 0.33mm/px · z∈[+1532,+1656]mm · 9 of 74 slices shown, 12 images]
[im 6/74  brain]
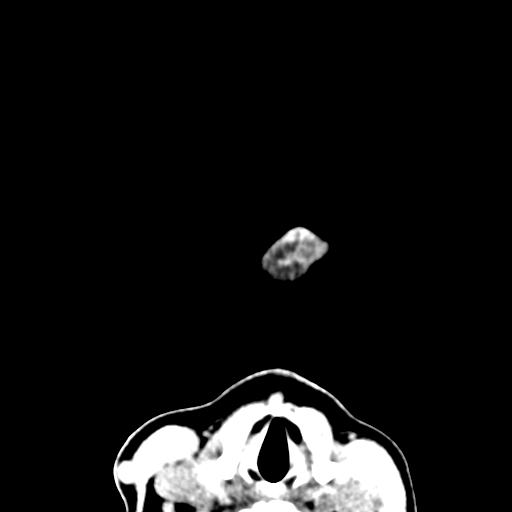
[im 6/74  bone]
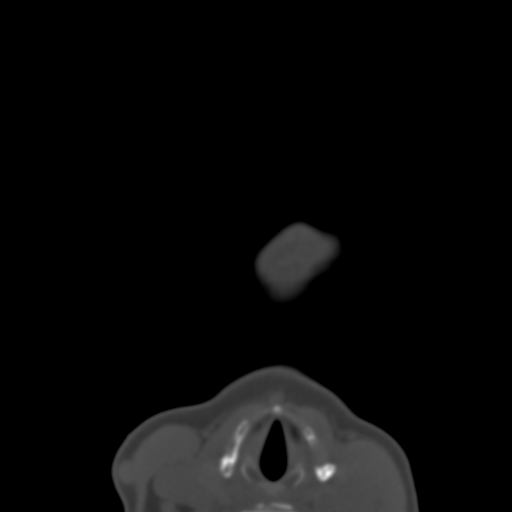
[im 13/74  bone]
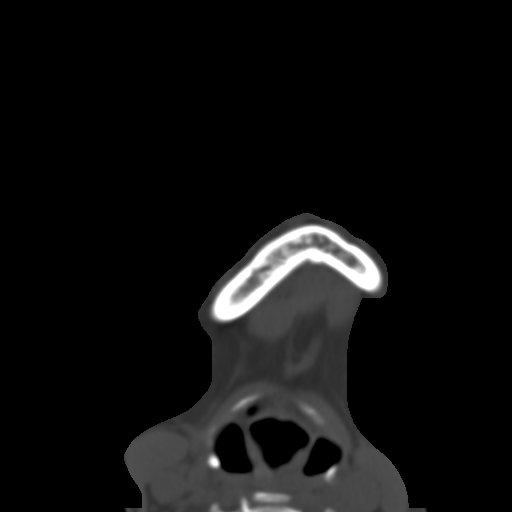
[im 21/74  bone]
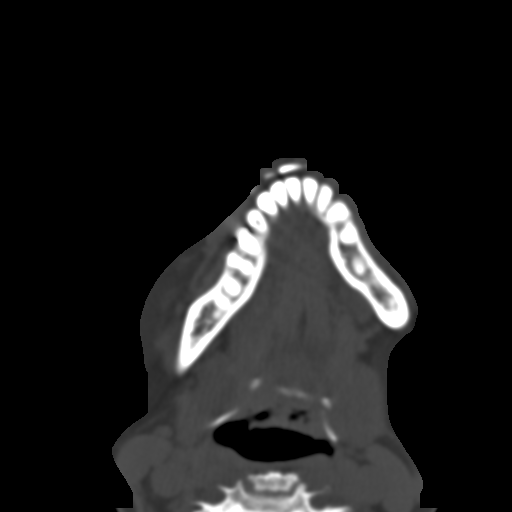
[im 28/74  bone]
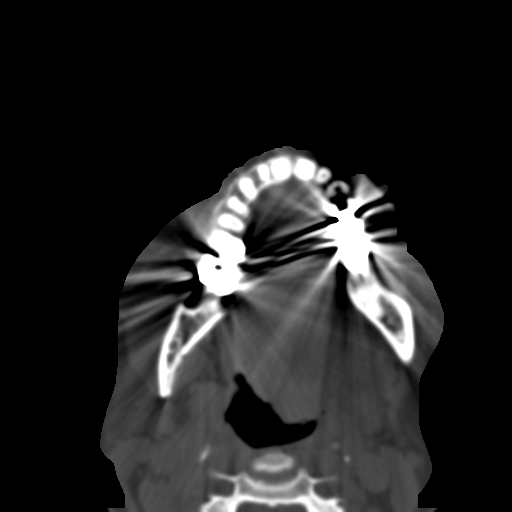
[im 38/74  brain]
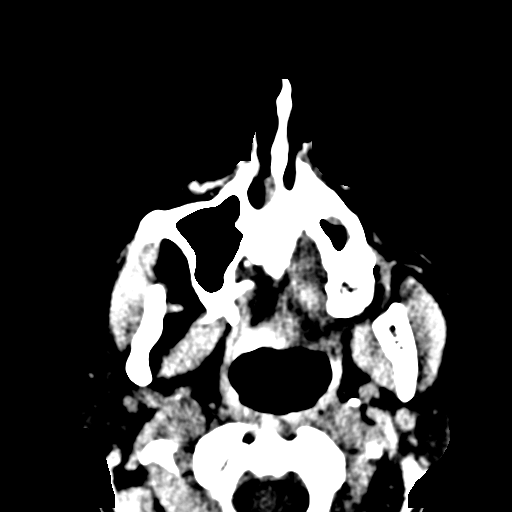
[im 38/74  bone]
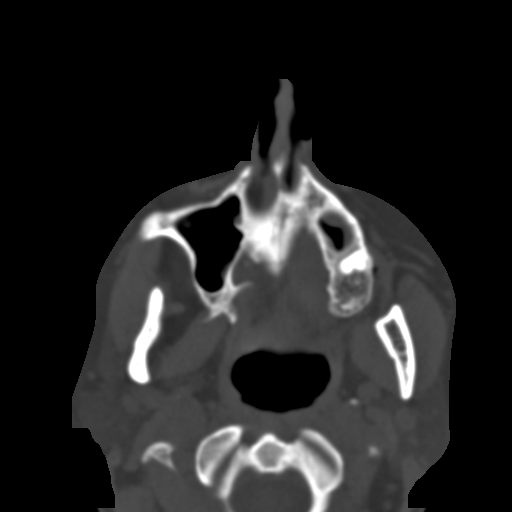
[im 46/74  bone]
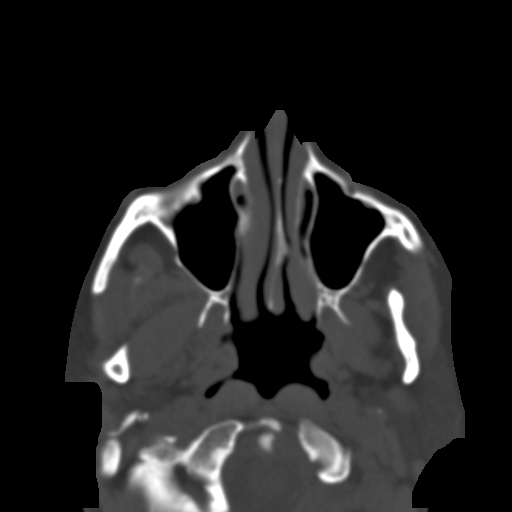
[im 53/74  bone]
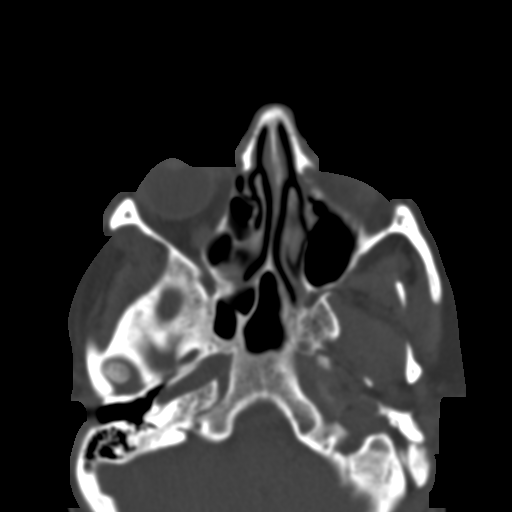
[im 61/74  bone]
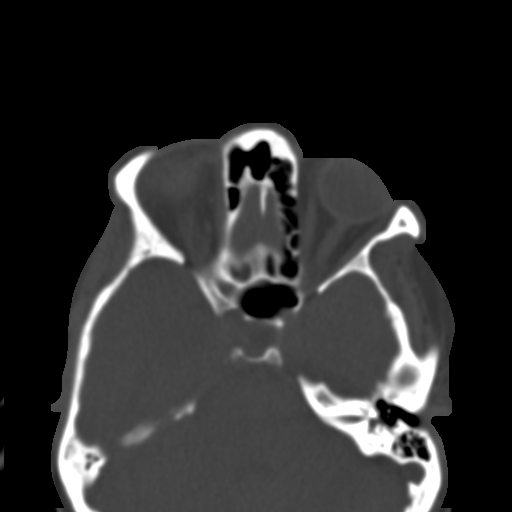
[im 68/74  brain]
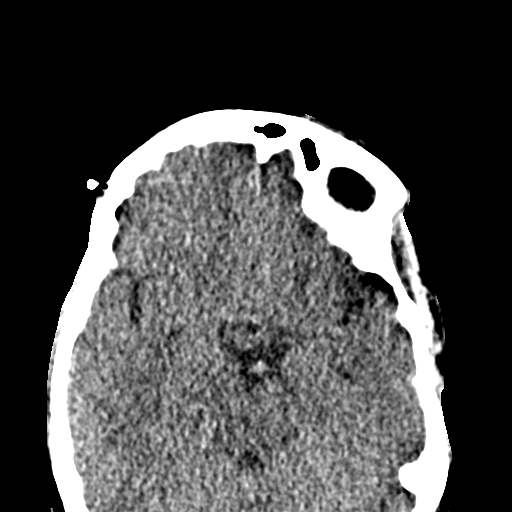
[im 68/74  bone]
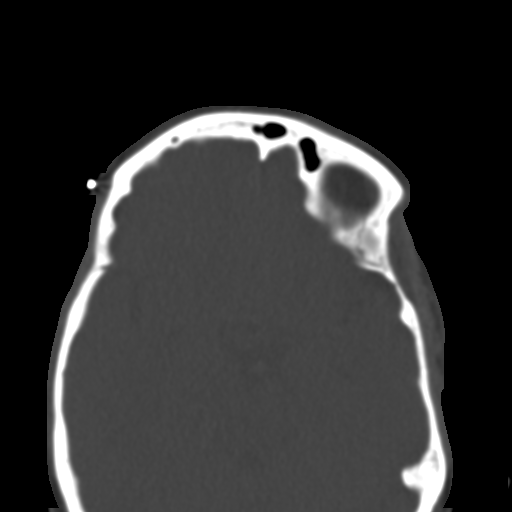

[Series 7: coronal st · coronal · 0.31mm/px · 3 of 93 slices shown]
[im 41/93  bone]
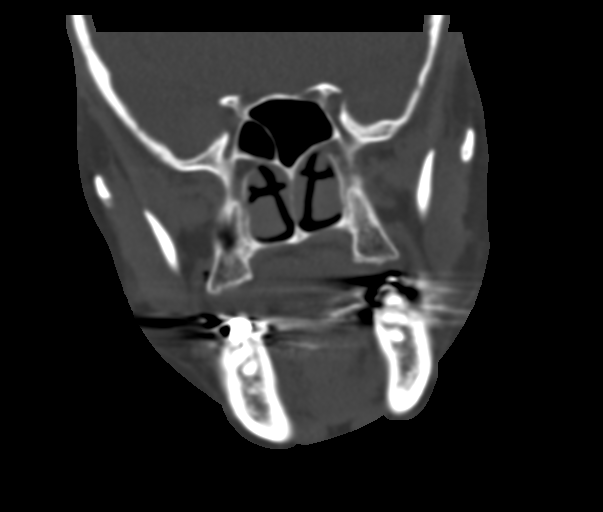
[im 52/93  bone]
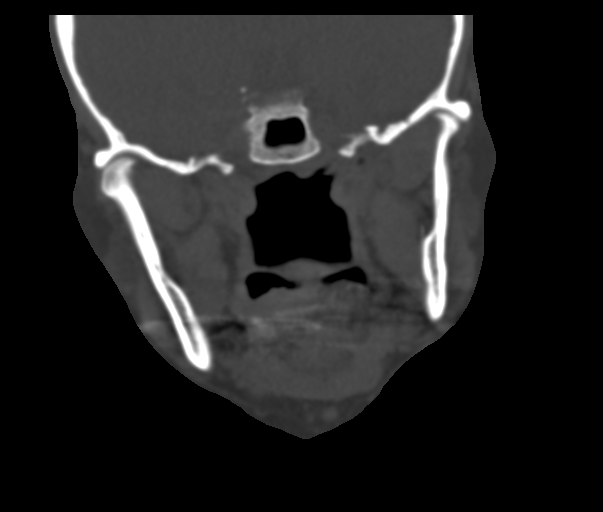
[im 62/93  bone]
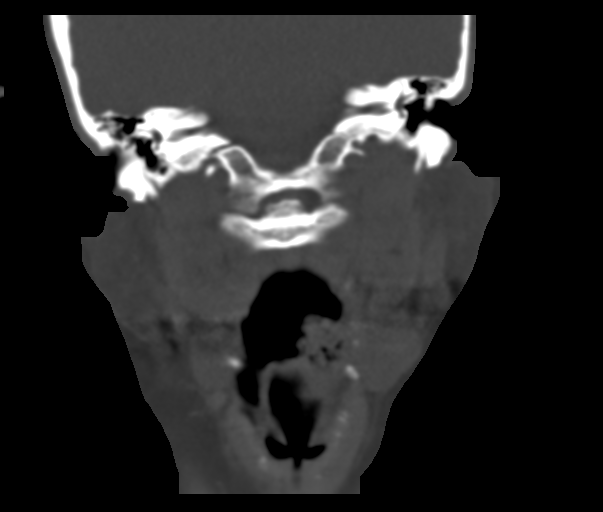

[Series 8: sagittal st · sagittal · 0.29mm/px · 3 of 77 slices shown]
[im 26/77  bone]
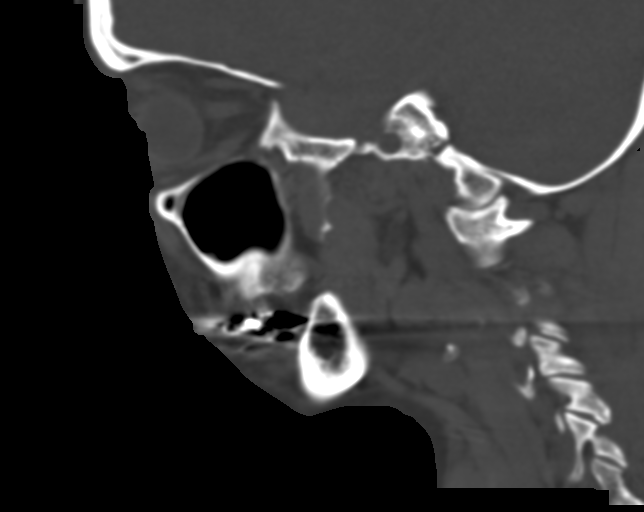
[im 39/77  bone]
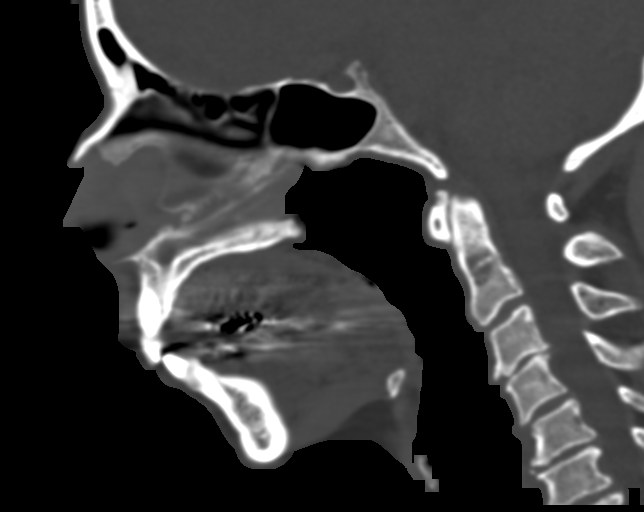
[im 51/77  bone]
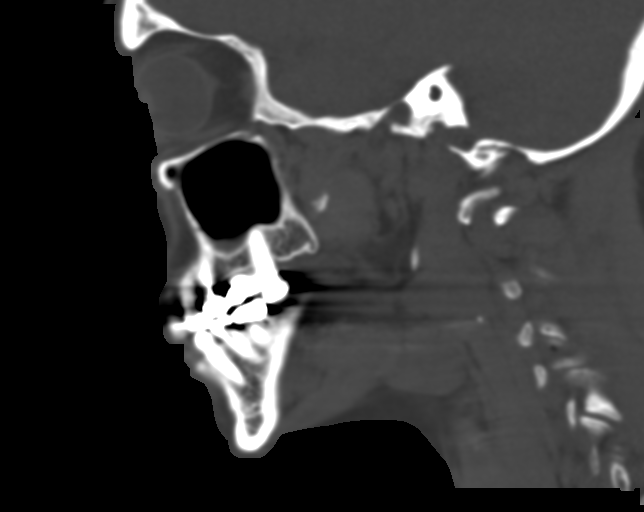

[15 of 47 positions shown; findings below may reference images not displayed]

FINDINGS: Osseous: Lower frontal bones are intact and normally aligned.
Osseous structures about the orbits are intact and normally aligned
bilaterally. No displaced nasal bone fracture. Bilateral zygoma and
pterygoid plates are intact. No mandible fracture or displacement
seen. Maxilla appears intact. No obvious tooth dislodgement seen.

Orbits: Orbital globes appear intact and symmetrically positioned.
No retro-orbital fluid or edema.

Sinuses: Mild mucosal thickening, versus small chronic mucous
retention cysts, within the maxillary sinuses. No fluid level or
other signs of acute sinusitis.

Soft tissues: No superficial soft tissue hematoma.

Limited intracranial: No significant or unexpected finding.
IMPRESSION: No facial bone fracture or dislocation seen.

## 2019-07-09 ENCOUNTER — Ambulatory Visit (INDEPENDENT_AMBULATORY_CARE_PROVIDER_SITE_OTHER): Payer: BLUE CROSS/BLUE SHIELD

## 2019-07-09 DIAGNOSIS — J309 Allergic rhinitis, unspecified: Secondary | ICD-10-CM

## 2019-07-15 ENCOUNTER — Ambulatory Visit (INDEPENDENT_AMBULATORY_CARE_PROVIDER_SITE_OTHER): Payer: BLUE CROSS/BLUE SHIELD

## 2019-07-15 DIAGNOSIS — J309 Allergic rhinitis, unspecified: Secondary | ICD-10-CM | POA: Diagnosis not present

## 2019-07-24 ENCOUNTER — Ambulatory Visit (INDEPENDENT_AMBULATORY_CARE_PROVIDER_SITE_OTHER): Payer: BLUE CROSS/BLUE SHIELD | Admitting: *Deleted

## 2019-07-24 DIAGNOSIS — J309 Allergic rhinitis, unspecified: Secondary | ICD-10-CM | POA: Diagnosis not present

## 2019-08-20 ENCOUNTER — Ambulatory Visit (INDEPENDENT_AMBULATORY_CARE_PROVIDER_SITE_OTHER): Payer: BLUE CROSS/BLUE SHIELD

## 2019-08-20 DIAGNOSIS — J309 Allergic rhinitis, unspecified: Secondary | ICD-10-CM

## 2019-09-16 ENCOUNTER — Ambulatory Visit (INDEPENDENT_AMBULATORY_CARE_PROVIDER_SITE_OTHER): Payer: BLUE CROSS/BLUE SHIELD

## 2019-09-16 DIAGNOSIS — J309 Allergic rhinitis, unspecified: Secondary | ICD-10-CM

## 2019-09-21 ENCOUNTER — Other Ambulatory Visit: Payer: Self-pay | Admitting: Allergy and Immunology

## 2019-09-24 ENCOUNTER — Other Ambulatory Visit: Payer: Self-pay | Admitting: *Deleted

## 2019-09-24 DIAGNOSIS — I10 Essential (primary) hypertension: Secondary | ICD-10-CM

## 2019-09-24 MED ORDER — HYDROCHLOROTHIAZIDE 25 MG PO TABS
12.5000 mg | ORAL_TABLET | Freq: Every day | ORAL | 0 refills | Status: DC
Start: 2019-09-24 — End: 2019-10-01

## 2019-09-24 MED ORDER — PRAVASTATIN SODIUM 20 MG PO TABS: 20.0000 mg | ORAL_TABLET | Freq: Every day | ORAL | 0 refills | Status: DC

## 2019-09-26 NOTE — Telephone Encounter (Signed)
Spoke with the patient and she has sch an appointment with [facility] on 09/30/2019 @ 3:15 p.m.

## 2019-09-30 ENCOUNTER — Ambulatory Visit: Payer: BC Managed Care – PPO | Admitting: Internal Medicine

## 2019-09-30 ENCOUNTER — Other Ambulatory Visit: Payer: Self-pay

## 2019-09-30 ENCOUNTER — Encounter: Payer: Self-pay | Admitting: Internal Medicine

## 2019-09-30 VITALS — BP 119/76 | HR 77 | Temp 98.0°F | Ht 63.0 in | Wt 133.8 lb

## 2019-09-30 DIAGNOSIS — G4700 Insomnia, unspecified: Secondary | ICD-10-CM | POA: Diagnosis not present

## 2019-09-30 DIAGNOSIS — F422 Mixed obsessional thoughts and acts: Secondary | ICD-10-CM

## 2019-09-30 DIAGNOSIS — E7800 Pure hypercholesterolemia, unspecified: Secondary | ICD-10-CM

## 2019-09-30 DIAGNOSIS — E782 Mixed hyperlipidemia: Secondary | ICD-10-CM

## 2019-09-30 DIAGNOSIS — E785 Hyperlipidemia, unspecified: Secondary | ICD-10-CM

## 2019-09-30 DIAGNOSIS — Z79899 Other long term (current) drug therapy: Secondary | ICD-10-CM

## 2019-09-30 DIAGNOSIS — F419 Anxiety disorder, unspecified: Secondary | ICD-10-CM

## 2019-09-30 DIAGNOSIS — F605 Obsessive-compulsive personality disorder: Secondary | ICD-10-CM | POA: Diagnosis not present

## 2019-09-30 DIAGNOSIS — R739 Hyperglycemia, unspecified: Secondary | ICD-10-CM

## 2019-09-30 DIAGNOSIS — I10 Essential (primary) hypertension: Secondary | ICD-10-CM | POA: Diagnosis not present

## 2019-09-30 DIAGNOSIS — Z0000 Encounter for general adult medical examination without abnormal findings: Secondary | ICD-10-CM

## 2019-09-30 LAB — POCT GLYCOSYLATED HEMOGLOBIN (HGB A1C): Hemoglobin A1C: 5.5 % (ref 4.0–5.6)

## 2019-09-30 LAB — GLUCOSE, CAPILLARY: Glucose-Capillary: 89 mg/dL (ref 70–99)

## 2019-09-30 MED ORDER — PAROXETINE HCL 30 MG PO TABS: 60.0000 mg | ORAL_TABLET | Freq: Every day | ORAL | 2 refills | Status: DC

## 2019-09-30 NOTE — Patient Instructions (Signed)
You were seen in the clinic for a follow-up appointment.  For your anxiety and OCD, we have increased your paroxetine to 60 mg daily.  If you do not have benefit after this medication increase, we can consider switching you to a different medication. We are also checking your blood sugar levels and your cholesterol.  We will call you with the results.  Thank you for allowing [place] to be part of your medical care.

## 2019-09-30 NOTE — Assessment & Plan Note (Addendum)
Patient has history of elevated glucose on prior BMP.  Will check hemoglobin A1c.  Patient also reports cataracts for which she is seeing a eye doctor, this increases suspicion for diabetes.

## 2019-09-30 NOTE — Assessment & Plan Note (Signed)
Patient continues to take pravastatin 20 mg daily.  Lipid panel last checked in 2018, will check today.

## 2019-09-30 NOTE — Assessment & Plan Note (Signed)
Patient reports issues with sleep.  She reports that she is able to fall asleep, but wakes up frequently throughout the night.  Patient counseled on sleep hygiene, for which she has been compliant.  Patient takes melatonin which helps but she reports sometimes makes her groggy the next day.  Patient articulates desire to avoid any stronger medications as she wants to avoid their side effects.  Worsening sleep quality may be secondary to increased anxiety, would benefit from better control of anxiety, OCD symptoms.

## 2019-09-30 NOTE — Assessment & Plan Note (Signed)
Patient with blood pressure of 130/92, 119/76 on recheck.  Patient reports taking hydrochlorothiazide 12.5 mg daily.  Blood pressures with good control, continue current therapy.

## 2019-09-30 NOTE — Assessment & Plan Note (Signed)
Patient reports taking paroxetine 40 mg daily.  Patient states over the last couple months she has had increase in anxiety, obsessive thoughts, compulsive actions.  These include checking the stove, making sure the doors locked.  Patient is interested in behavioral therapy as well as adjusting medication.  Plan: *Increase paroxetine dosage to 60 mg daily.  If patient does not have improvement in symptoms, will consider down titrating paroxetine with initiation of alternative agent such as citalopram *Referral placed integrated behavioral health for cognitive behavioral therapy

## 2019-09-30 NOTE — Progress Notes (Signed)
   CC: Anxiety, increase in obsessive thoughts, compulsive actions.  HPI: Patient is a 64 year old female with past medical history significant for obsessive-compulsive disorder, hypertension who presents for follow-up.  [name] is a 65 y.o.   Past Medical History:  Diagnosis Date  . Allergic rhinitis   . Anxiety disorder   . Hypertension 04/24/2007   Qualifier: Diagnosis of  Problem Stop Reason:  By: Jerilee Hoh MD, Olam Idler    . Mitral valve prolapse   . Obsessive compulsive disorder   . Sinusitis    chronic  . Ulcerative colitis    sees Dr. Collene Mares - normal colonoscopy in Nov 2011   Review of Systems:   Review of Systems  Constitutional: Negative for chills and fever.  Respiratory: Negative for cough and shortness of breath.   Cardiovascular: Negative for chest pain.  Gastrointestinal: Negative for abdominal pain, nausea and vomiting.  Genitourinary: Negative for frequency.  All other systems reviewed and are negative.    Physical Exam:  Vitals:   09/30/19 1459 09/30/19 1502  BP: (!) 132/92 119/76  Pulse: 81 77  Temp: 98 F (36.7 C)   TempSrc: Oral   SpO2: 97%   Weight: 133 lb 12.8 oz (60.7 kg)   Height: 5' 3"  (1.6 m)    Physical Exam  Constitutional: She is well-developed, well-nourished, and in no distress.  HENT:  Head: Normocephalic and atraumatic.  Eyes: EOM are normal. Right eye exhibits no discharge. Left eye exhibits no discharge.  Neck: No tracheal deviation present.  Cardiovascular: Normal rate and regular rhythm. Exam reveals no gallop and no friction rub.  No murmur heard. Pulmonary/Chest: Effort normal and breath sounds normal. No respiratory distress. She has no wheezes. She has no rales.  Abdominal: Soft. She exhibits no distension. There is no abdominal tenderness. There is no rebound and no guarding.  Musculoskeletal:        General: No tenderness, deformity or edema. Normal range of motion.     Cervical back: Normal range of motion.    Neurological: She is alert. Coordination normal.   *5/5 strength in bilateral upper extremities *Mildly decreased (4+/5) strength on left hamstring flexion. Otherwise 5/5 strength bilaterally *No cranial nerve deficit *No cogwheel rigidity on bilateral passive arm flexion/extension *Normal gait on walk test  Skin: Skin is warm and dry. No rash noted. She is not diaphoretic. No erythema.  Psychiatric: Memory and judgment normal.     Assessment & Plan:   See Encounters Tab for problem based charting.  Patient discussed with Dr. Daryll Drown

## 2019-10-01 ENCOUNTER — Other Ambulatory Visit: Payer: Self-pay | Admitting: Internal Medicine

## 2019-10-01 DIAGNOSIS — I10 Essential (primary) hypertension: Secondary | ICD-10-CM

## 2019-10-01 LAB — LIPID PANEL
Chol/HDL Ratio: 2.9 ratio (ref 0.0–4.4)
Cholesterol, Total: 199 mg/dL (ref 100–199)
HDL: 69 mg/dL (ref >39)
LDL Chol Calc (NIH): 112 mg/dL — ABNORMAL HIGH (ref 0–99)
Triglycerides: 99 mg/dL (ref 0–149)
VLDL Cholesterol Cal: 18 mg/dL (ref 5–40)

## 2019-10-01 MED ORDER — HYDROCHLOROTHIAZIDE 12.5 MG PO TABS: 12.5000 mg | ORAL_TABLET | Freq: Every day | ORAL | 3 refills | Status: DC

## 2019-10-01 NOTE — Progress Notes (Addendum)
Spoke with patient to update her on results of lab studies.  Patient requested 12.5 mg tablet of hydrochlorothiazide so she would not have to split her 25 mg pills.  Prescription sent.

## 2019-10-01 NOTE — Progress Notes (Signed)
Internal Medicine Clinic Attending  Case discussed with Dr. Benjamine Mola at the time of the visit.  We reviewed the resident's history and exam and pertinent patient test results.  I agree with the assessment, diagnosis, and plan of care documented in the resident's note.

## 2019-10-09 ENCOUNTER — Telehealth: Payer: Self-pay | Admitting: Licensed Clinical Social Worker

## 2019-10-09 NOTE — Telephone Encounter (Signed)
Patient was called, and the referral was discussed. Patient reported that it was not a good time for her, and she plans to contact our office to set up a future appointment.

## 2019-10-10 DIAGNOSIS — H5203 Hypermetropia, bilateral: Secondary | ICD-10-CM | POA: Diagnosis not present

## 2019-10-10 DIAGNOSIS — H2513 Age-related nuclear cataract, bilateral: Secondary | ICD-10-CM | POA: Diagnosis not present

## 2019-10-10 DIAGNOSIS — H52203 Unspecified astigmatism, bilateral: Secondary | ICD-10-CM | POA: Diagnosis not present

## 2019-10-10 DIAGNOSIS — H524 Presbyopia: Secondary | ICD-10-CM | POA: Diagnosis not present

## 2019-10-10 DIAGNOSIS — H25013 Cortical age-related cataract, bilateral: Secondary | ICD-10-CM | POA: Diagnosis not present

## 2019-10-15 ENCOUNTER — Ambulatory Visit (INDEPENDENT_AMBULATORY_CARE_PROVIDER_SITE_OTHER): Payer: BLUE CROSS/BLUE SHIELD

## 2019-10-15 DIAGNOSIS — J309 Allergic rhinitis, unspecified: Secondary | ICD-10-CM

## 2019-10-22 ENCOUNTER — Encounter: Payer: Self-pay | Admitting: Licensed Clinical Social Worker

## 2019-10-22 ENCOUNTER — Other Ambulatory Visit: Payer: Self-pay | Admitting: Internal Medicine

## 2019-10-22 ENCOUNTER — Other Ambulatory Visit: Payer: Self-pay | Admitting: Allergy and Immunology

## 2019-10-22 DIAGNOSIS — I10 Essential (primary) hypertension: Secondary | ICD-10-CM

## 2019-11-09 ENCOUNTER — Other Ambulatory Visit: Payer: Self-pay | Admitting: Allergy and Immunology

## 2019-11-11 ENCOUNTER — Telehealth: Payer: Self-pay | Admitting: Allergy and Immunology

## 2019-11-11 MED ORDER — OMEPRAZOLE 20 MG PO CPDR: DELAYED_RELEASE_CAPSULE | ORAL | 0 refills | Status: DC

## 2019-11-11 NOTE — Telephone Encounter (Signed)
[facility] refill has been sent in. Called patient and advised and informed to keep appointment so that we can send in further medications. Patient verbalized understanding.

## 2019-11-11 NOTE — Telephone Encounter (Signed)
Patient called and needs to have her prilosec 20 mg called into [facility] [address] [phone]. She has appointment on tues. April 20.

## 2019-11-15 ENCOUNTER — Ambulatory Visit (INDEPENDENT_AMBULATORY_CARE_PROVIDER_SITE_OTHER): Payer: BC Managed Care – PPO

## 2019-11-15 DIAGNOSIS — J309 Allergic rhinitis, unspecified: Secondary | ICD-10-CM | POA: Diagnosis not present

## 2019-11-19 ENCOUNTER — Ambulatory Visit: Payer: BC Managed Care – PPO | Admitting: Allergy and Immunology

## 2019-11-19 ENCOUNTER — Other Ambulatory Visit: Payer: Self-pay

## 2019-11-19 ENCOUNTER — Encounter: Payer: Self-pay | Admitting: Allergy and Immunology

## 2019-11-19 VITALS — BP 106/72 | HR 75 | Temp 97.6°F | Resp 16 | Ht 61.5 in | Wt 133.4 lb

## 2019-11-19 DIAGNOSIS — J3089 Other allergic rhinitis: Secondary | ICD-10-CM | POA: Diagnosis not present

## 2019-11-19 DIAGNOSIS — J4530 Mild persistent asthma, uncomplicated: Secondary | ICD-10-CM

## 2019-11-19 DIAGNOSIS — K219 Gastro-esophageal reflux disease without esophagitis: Secondary | ICD-10-CM

## 2019-11-19 DIAGNOSIS — G4720 Circadian rhythm sleep disorder, unspecified type: Secondary | ICD-10-CM | POA: Diagnosis not present

## 2019-11-19 MED ORDER — AZELASTINE HCL 0.1 % NA SOLN: NASAL | 11 refills | Status: DC

## 2019-11-19 MED ORDER — OMEPRAZOLE 20 MG PO CPDR: DELAYED_RELEASE_CAPSULE | ORAL | 0 refills | Status: DC

## 2019-11-19 MED ORDER — CYPROHEPTADINE HCL 4 MG PO TABS: ORAL_TABLET | ORAL | 11 refills | Status: DC

## 2019-11-19 MED ORDER — QVAR REDIHALER 80 MCG/ACT IN AERB: 2.0000 | INHALATION_SPRAY | Freq: Two times a day (BID) | RESPIRATORY_TRACT | 11 refills | Status: DC

## 2019-11-19 MED ORDER — ALBUTEROL SULFATE HFA 108 (90 BASE) MCG/ACT IN AERS: 2.0000 | INHALATION_SPRAY | RESPIRATORY_TRACT | 1 refills | Status: DC | PRN

## 2019-11-19 NOTE — Patient Instructions (Addendum)
  1. Continue Qvar 80 REDIHALER 2 inhalations 1 time per day   2. Continue Nasonex one spray each nostril 1 time per day  3. Continue immunotherapy and EpiPen  4. Continue omeprazole 20 mg twice a day  5. Continue Cyproheptadine 2m one half to one tablet at bedtime (can try 1/4 tablet)  6. Continue nasal Astelin, nasal saline, Zyrtec, Proventil HFA, Mucinex DM, and Zaditor if needed  7. Obtain Covid vaccine  8. Return to clinic in 1 year or earlier if problem

## 2019-11-19 NOTE — Progress Notes (Signed)
[facility] [place] [facility] [place]   Follow-up Note  Referring Provider: Marianna Payment, MD Primary Provider: Marianna Payment, MD Date of Office Visit: 11/19/2019  Subjective:   Christina Burton (DOB: Jul 04, 1956) is a 64 y.o. female who returns to the [facility] on 11/19/2019 in re-evaluation of the following:  HPI: Chimene returns to this clinic in evaluation of asthma and allergic rhinoconjunctivitis and LPR and sleep dysfunction.  Her last visit to this clinic was 04 September 2018.  Her asthma has been doing relatively well.  She still occasionally has a cough.  It appears to be a "croupy cough".  She does not have any shortness of breath and she rarely uses a short acting bronchodilator while she continues on a low-dose of Qvar.  She has not required a systemic steroid to treat an exacerbation.  She did have an episode about 10 days ago of fever and nasal congestion and runny nose and a slight cough that lasted about 48 hours but fortunately completely resolved.  She does have issues with her eyes.  She has dry eye syndrome and has been using Systane and various other drops including some Zaditor on occasion.  Even though she uses these medications she still has some occasional burning gritty eyes.  Her nose appears to be doing pretty well at this point in time while using some nasal steroid.  She believes that her reflux is under pretty good control at this point in time while using omeprazole twice a day.  She still has sleep dysfunction.  She does sleep better when using Periactin but it gives rise to a hangover effect and thus she only uses it on the weekends.  Her immunotherapy is going quite well at every 4 weeks.  Allergies as of 11/19/2019      Reactions   Amoxicillin Hives   Articaine    Caused numbness that too long - lasted about 6 months when used by dentist   Atenolol Hives   Sulfonamide Derivatives    REACTION: Unknown  reaction   Trimox [amoxicillin Trihydrate]       Medication List      acetaminophen 500 MG tablet Commonly known as: TYLENOL Take 500 mg by mouth every 6 (six) hours as needed. For pain   albuterol 108 (90 Base) MCG/ACT inhaler Commonly known as: ProAir HFA Inhale 2 puffs into the lungs every 4 (four) hours as needed for wheezing or shortness of breath.   azelastine 0.1 % nasal spray Commonly known as: Astelin USE ONE SPRAY IN EACH NOSTRIL TWICE DAILY IF NEEDED   beclomethasone 80 MCG/ACT inhaler Commonly known as: Qvar RediHaler Inhale 2 puffs into the lungs 2 (two) times daily.   benzonatate 100 MG capsule Commonly known as: TESSALON Take 1 capsule (100 mg total) by mouth 3 (three) times daily as needed for cough.   CALCIUM PO Take by mouth daily.   Canasa 1000 MG suppository Generic drug: mesalamine Place 1,000 mg rectally daily as needed. Use as directed by your gastroenterologist for ulcerative colitis   mesalamine 1.2 g EC tablet Commonly known as: LIALDA Take 2.4 g by mouth daily.   cetirizine 10 MG tablet Commonly known as: ZYRTEC Take 10 mg by mouth daily.   cyproheptadine 4 MG tablet Commonly known as: PERIACTIN TAKE ONE-HALF TO ONE TABLET AT BEDTIME   EPINEPHrine 0.3 mg/0.3 mL Soaj injection Commonly known as: EPI-PEN epinephrine 0.3 mg/0.3 mL injection, auto-injector   fluticasone 50  MCG/ACT nasal spray Commonly known as: FLONASE Place 2 sprays into both nostrils daily.   hydrochlorothiazide 12.5 MG tablet Commonly known as: HYDRODIURIL Take 1 tablet (12.5 mg total) by mouth daily.   hydrochlorothiazide 25 MG tablet Commonly known as: HYDRODIURIL TAKE 1/2 TABLET(12.5 MG) BY MOUTH DAILY   Melatonin 10 MG Tabs Take 10 mg by mouth at bedtime as needed (insomnia). Take 2-3 hours before bedtime.   multivitamin with minerals Tabs tablet Take 1 tablet by mouth daily.   omeprazole 20 MG capsule Commonly known as: PRILOSEC TAKE 1 CAPSULE(20 MG)  BY MOUTH TWICE DAILY   PARoxetine 30 MG tablet Commonly known as: PAXIL Take 2 tablets (60 mg total) by mouth daily.   pravastatin 20 MG tablet Commonly known as: PRAVACHOL TAKE 1 TABLET(20 MG) BY MOUTH DAILY   VITAMIN C PO Take 1 tablet by mouth daily.   VITAMIN D PO Take by mouth daily.       Past Medical History:  Diagnosis Date  . Allergic rhinitis   . Anxiety disorder   . Asthma   . Hypertension 04/24/2007   Qualifier: Diagnosis of  Problem Stop Reason:  By: Jerilee Hoh MD, Olam Idler    . Mitral valve prolapse   . Obsessive compulsive disorder   . Sinusitis    chronic  . Ulcerative colitis    sees Dr. Collene Mares - normal colonoscopy in Nov 2011    Past Surgical History:  Procedure Laterality Date  . CARPAL TUNNEL RELEASE    . HERNIA REPAIR    . TONSILLECTOMY AND ADENOIDECTOMY    . TRIGGER FINGER RELEASE     right thumb    Review of systems negative except as noted in HPI / PMHx or noted below:  Review of Systems  Constitutional: Negative.   HENT: Negative.   Eyes: Negative.   Respiratory: Negative.   Cardiovascular: Negative.   Gastrointestinal: Negative.   Genitourinary: Negative.   Musculoskeletal: Negative.   Skin: Negative.   Neurological: Negative.   Endo/Heme/Allergies: Negative.   Psychiatric/Behavioral: Negative.      Objective:   Vitals:   11/19/19 1637  BP: 106/72  Pulse: 75  Resp: 16  Temp: 97.6 F (36.4 C)  SpO2: 94%   Height: 5' 1.5" (156.2 cm)  Weight: 133 lb 6.4 oz (60.5 kg)   Physical Exam Constitutional:      Appearance: She is not diaphoretic.  HENT:     Head: Normocephalic.     Right Ear: Tympanic membrane, ear canal and external ear normal.     Left Ear: Tympanic membrane, ear canal and external ear normal.     Nose: Nose normal. No mucosal edema or rhinorrhea.     Mouth/Throat:     Pharynx: Uvula midline. No oropharyngeal exudate.  Eyes:     Conjunctiva/sclera: Conjunctivae normal.  Neck:     Thyroid: No  thyromegaly.     Trachea: Trachea normal. No tracheal tenderness or tracheal deviation.  Cardiovascular:     Rate and Rhythm: Normal rate and regular rhythm.     Heart sounds: Normal heart sounds, S1 normal and S2 normal. No murmur.  Pulmonary:     Effort: No respiratory distress.     Breath sounds: Normal breath sounds. No stridor. No wheezing or rales.  Lymphadenopathy:     Head:     Right side of head: No tonsillar adenopathy.     Left side of head: No tonsillar adenopathy.     Cervical: No cervical adenopathy.  Skin:  Findings: No erythema or rash.     Nails: There is no clubbing.  Neurological:     Mental Status: She is alert.     Diagnostics:    Spirometry was performed and demonstrated an FEV1 of 2.32 at 103 % of predicted.  The patient had an Asthma Control Test with the following results: ACT Total Score: 24.    Assessment and Plan:   1. Asthma, well controlled, mild persistent   2. Other allergic rhinitis   3. LPRD (laryngopharyngeal reflux disease)   4. Dysfunction of sleep stage or arousal      1. Continue Qvar 80 REDIHALER 2 inhalations 1 time per day   2. Continue Nasonex one spray each nostril 1 time per day  3. Continue immunotherapy and EpiPen  4. Continue omeprazole 20 mg twice a day  5. Continue Cyproheptadine 87m one half to one tablet at bedtime (can try 1/4 tablet)  6. Continue nasal Astelin, nasal saline, Zyrtec, Proventil HFA, Mucinex DM, and Zaditor if needed  7. Obtain Covid vaccine  8. Return to clinic in 1 year or earlier if problem  NRetherappears to be doing okay with her respiratory tract issue on her current therapy which does include immunotherapy and her reflux appears to be under pretty good control as well at this point.  I think one of the big issues for NMendeis the fact that she has very significant sleep dysfunction.  I have asked her to try a very low dose of cyproheptadine every day of the week rather than relying on  the use of this medication only on the weekend.  She can try 1 mg per night over the course of the next several weeks and if this helps reset her sleep and she feels better during the daytime than I asked her to continue on this dose.  She will keep in contact with me noting her response to this approach.  She has never obtained a sleep study in the past and she may need to complete the study if she still continues to have sleep dysfunction.  I also made the recommendation that she obtain the Covid vaccine.  She is somewhat hesitant about obtaining this vaccine because of long-term side effects which are undefined.  She lives with her 829year old mother and I made the recommendation that they both obtain the Covid vaccine as soon as possible.  I will see her back in his clinic in 1 year or earlier if there is a problem.  EAllena Katz MD Allergy / Immunology [facility]

## 2019-11-20 ENCOUNTER — Encounter: Payer: Self-pay | Admitting: Allergy and Immunology

## 2019-11-21 NOTE — Addendum Note (Signed)
Addended by: Hulan Fray on: 11/21/2019 04:46 PM   Modules accepted: Orders

## 2019-11-22 ENCOUNTER — Other Ambulatory Visit: Payer: Self-pay | Admitting: *Deleted

## 2019-11-22 MED ORDER — MOMETASONE FUROATE 50 MCG/ACT NA SUSP: 1.0000 | Freq: Two times a day (BID) | NASAL | 5 refills | Status: DC

## 2019-12-02 ENCOUNTER — Telehealth: Payer: Self-pay | Admitting: *Deleted

## 2019-12-02 NOTE — Telephone Encounter (Signed)
PA has been submitted for Mometasone nasal spray through Cover My Meds and is currently pending approval or denial.

## 2019-12-03 ENCOUNTER — Telehealth: Payer: Self-pay

## 2019-12-03 NOTE — Telephone Encounter (Signed)
Patient called requesting samples of mucinex dm. I did place samples up front for her and advised that she stay well hydrated while taking the medication. I told her to take it as directed per package instructions. Patient verbalized understanding.

## 2019-12-03 NOTE — Telephone Encounter (Signed)
PA is still pending.

## 2019-12-04 MED ORDER — FLUNISOLIDE 25 MCG/ACT (0.025%) NA SOLN: 1.0000 | Freq: Two times a day (BID) | NASAL | 5 refills | Status: DC

## 2019-12-04 NOTE — Telephone Encounter (Signed)
Called and spoke with patient and advised of change in nasal spray due to insurance. Patient verbalized understanding. Prescription has been sent in to requested pharmacy, patient is aware.

## 2019-12-04 NOTE — Telephone Encounter (Signed)
PA was denied for Mometasone nasal spray stating that patient must try and fail Flunisolide nasal spray first. Please advise change in medication.

## 2019-12-04 NOTE — Addendum Note (Signed)
Addended by: Chip Boer R on: 12/04/2019 11:40 AM   Modules accepted: Orders

## 2019-12-04 NOTE — Telephone Encounter (Signed)
Please have her try and fail Flunisolide nasal spray - same instructions as Nasonex. Please explain to patient the insurance situation.

## 2019-12-11 ENCOUNTER — Ambulatory Visit (INDEPENDENT_AMBULATORY_CARE_PROVIDER_SITE_OTHER): Payer: BC Managed Care – PPO

## 2019-12-11 DIAGNOSIS — J309 Allergic rhinitis, unspecified: Secondary | ICD-10-CM

## 2019-12-16 DIAGNOSIS — J3089 Other allergic rhinitis: Secondary | ICD-10-CM | POA: Diagnosis not present

## 2019-12-17 NOTE — Progress Notes (Signed)
VIALS EXP 12-16-20

## 2020-01-05 ENCOUNTER — Other Ambulatory Visit: Payer: Self-pay | Admitting: Allergy and Immunology

## 2020-01-07 ENCOUNTER — Ambulatory Visit (INDEPENDENT_AMBULATORY_CARE_PROVIDER_SITE_OTHER): Payer: BC Managed Care – PPO

## 2020-01-07 ENCOUNTER — Telehealth: Payer: Self-pay | Admitting: Allergy and Immunology

## 2020-01-07 DIAGNOSIS — J309 Allergic rhinitis, unspecified: Secondary | ICD-10-CM | POA: Diagnosis not present

## 2020-01-07 NOTE — Telephone Encounter (Signed)
Patient came to window and said that starting July the prilosec will not be cover and need to get something else. [place] [phone].

## 2020-01-07 NOTE — Telephone Encounter (Signed)
Called and was informed that patient was not available. I left a message asking that she call the office back in regards to Prilosec. Will need to see what is covered.

## 2020-01-13 NOTE — Telephone Encounter (Signed)
Called and left a message for patient to call the office in regards to this matter.

## 2020-01-14 ENCOUNTER — Other Ambulatory Visit: Payer: Self-pay

## 2020-01-14 MED ORDER — PANTOPRAZOLE SODIUM 40 MG PO TBEC: 40.0000 mg | DELAYED_RELEASE_TABLET | Freq: Every day | ORAL | 5 refills | Status: DC

## 2020-01-14 NOTE — Telephone Encounter (Signed)
Informed pt of the change and in sent the rx to pts pharmacy

## 2020-01-14 NOTE — Telephone Encounter (Signed)
Substitute pantoprazole 40 mg to replace omeprazole with same administration instructions. Inform patient.

## 2020-01-14 NOTE — Telephone Encounter (Signed)
According to patient's insurance 2021  formulary plan Omeprazole is a tier 2 drug where pantoprazole sodium ec tab 40 mg (60 tablets/30 days) is a tier 1 drug. Please advise on this Dr. Neldon Mc

## 2020-01-21 ENCOUNTER — Other Ambulatory Visit: Payer: Self-pay | Admitting: Internal Medicine

## 2020-01-29 DIAGNOSIS — M533 Sacrococcygeal disorders, not elsewhere classified: Secondary | ICD-10-CM | POA: Diagnosis not present

## 2020-02-04 ENCOUNTER — Ambulatory Visit (INDEPENDENT_AMBULATORY_CARE_PROVIDER_SITE_OTHER): Payer: BC Managed Care – PPO

## 2020-02-04 DIAGNOSIS — J309 Allergic rhinitis, unspecified: Secondary | ICD-10-CM

## 2020-02-12 ENCOUNTER — Ambulatory Visit (INDEPENDENT_AMBULATORY_CARE_PROVIDER_SITE_OTHER): Payer: BC Managed Care – PPO

## 2020-02-12 DIAGNOSIS — J309 Allergic rhinitis, unspecified: Secondary | ICD-10-CM | POA: Diagnosis not present

## 2020-02-14 DIAGNOSIS — M25562 Pain in left knee: Secondary | ICD-10-CM | POA: Diagnosis not present

## 2020-02-14 DIAGNOSIS — R269 Unspecified abnormalities of gait and mobility: Secondary | ICD-10-CM | POA: Diagnosis not present

## 2020-02-18 ENCOUNTER — Ambulatory Visit (INDEPENDENT_AMBULATORY_CARE_PROVIDER_SITE_OTHER): Payer: BC Managed Care – PPO

## 2020-02-18 DIAGNOSIS — J309 Allergic rhinitis, unspecified: Secondary | ICD-10-CM

## 2020-02-24 ENCOUNTER — Ambulatory Visit (INDEPENDENT_AMBULATORY_CARE_PROVIDER_SITE_OTHER): Payer: BC Managed Care – PPO

## 2020-02-24 DIAGNOSIS — J309 Allergic rhinitis, unspecified: Secondary | ICD-10-CM

## 2020-02-28 ENCOUNTER — Encounter: Payer: Self-pay | Admitting: Internal Medicine

## 2020-02-28 DIAGNOSIS — F422 Mixed obsessional thoughts and acts: Secondary | ICD-10-CM

## 2020-03-02 MED ORDER — PAROXETINE HCL 30 MG PO TABS: 60.0000 mg | ORAL_TABLET | Freq: Every day | ORAL | 3 refills | Status: DC

## 2020-03-03 ENCOUNTER — Ambulatory Visit (INDEPENDENT_AMBULATORY_CARE_PROVIDER_SITE_OTHER): Payer: BC Managed Care – PPO

## 2020-03-03 DIAGNOSIS — J309 Allergic rhinitis, unspecified: Secondary | ICD-10-CM | POA: Diagnosis not present

## 2020-03-30 ENCOUNTER — Ambulatory Visit (INDEPENDENT_AMBULATORY_CARE_PROVIDER_SITE_OTHER): Payer: BC Managed Care – PPO

## 2020-03-30 DIAGNOSIS — J309 Allergic rhinitis, unspecified: Secondary | ICD-10-CM | POA: Diagnosis not present

## 2020-04-20 ENCOUNTER — Other Ambulatory Visit: Payer: Self-pay | Admitting: Internal Medicine

## 2020-04-20 DIAGNOSIS — M79671 Pain in right foot: Secondary | ICD-10-CM | POA: Diagnosis not present

## 2020-04-20 DIAGNOSIS — R269 Unspecified abnormalities of gait and mobility: Secondary | ICD-10-CM | POA: Diagnosis not present

## 2020-04-20 DIAGNOSIS — M79672 Pain in left foot: Secondary | ICD-10-CM | POA: Diagnosis not present

## 2020-04-21 NOTE — Telephone Encounter (Signed)
Spoke with the patient this morning.  Appt has been sch for 05/04/2020 @ 2:45 pm with Dr. Marianna Payment.

## 2020-04-23 ENCOUNTER — Other Ambulatory Visit: Payer: Self-pay

## 2020-04-23 DIAGNOSIS — I10 Essential (primary) hypertension: Secondary | ICD-10-CM

## 2020-04-23 MED ORDER — HYDROCHLOROTHIAZIDE 12.5 MG PO TABS: 12.5000 mg | ORAL_TABLET | Freq: Every day | ORAL | 3 refills | Status: DC

## 2020-04-27 ENCOUNTER — Ambulatory Visit (INDEPENDENT_AMBULATORY_CARE_PROVIDER_SITE_OTHER): Payer: BC Managed Care – PPO | Admitting: *Deleted

## 2020-04-27 DIAGNOSIS — J309 Allergic rhinitis, unspecified: Secondary | ICD-10-CM

## 2020-04-30 DIAGNOSIS — Z1211 Encounter for screening for malignant neoplasm of colon: Secondary | ICD-10-CM | POA: Diagnosis not present

## 2020-04-30 DIAGNOSIS — K219 Gastro-esophageal reflux disease without esophagitis: Secondary | ICD-10-CM | POA: Diagnosis not present

## 2020-04-30 DIAGNOSIS — K5190 Ulcerative colitis, unspecified, without complications: Secondary | ICD-10-CM | POA: Diagnosis not present

## 2020-05-04 ENCOUNTER — Other Ambulatory Visit: Payer: Self-pay

## 2020-05-04 ENCOUNTER — Ambulatory Visit (INDEPENDENT_AMBULATORY_CARE_PROVIDER_SITE_OTHER): Payer: BC Managed Care – PPO | Admitting: Internal Medicine

## 2020-05-04 ENCOUNTER — Encounter: Payer: Self-pay | Admitting: Internal Medicine

## 2020-05-04 VITALS — BP 128/85 | HR 87 | Ht 63.0 in | Wt 131.9 lb

## 2020-05-04 DIAGNOSIS — Z9181 History of falling: Secondary | ICD-10-CM

## 2020-05-04 DIAGNOSIS — W19XXXD Unspecified fall, subsequent encounter: Secondary | ICD-10-CM

## 2020-05-04 DIAGNOSIS — F411 Generalized anxiety disorder: Secondary | ICD-10-CM | POA: Diagnosis not present

## 2020-05-04 DIAGNOSIS — Z0000 Encounter for general adult medical examination without abnormal findings: Secondary | ICD-10-CM | POA: Diagnosis not present

## 2020-05-04 DIAGNOSIS — K51919 Ulcerative colitis, unspecified with unspecified complications: Secondary | ICD-10-CM

## 2020-05-04 NOTE — Patient Instructions (Signed)
Thank you, Ms.[name] for allowing [place] to provide your care today. Today we discussed Lower extremity weakness, Anxiety.     I have ordered the following labs for you:  Lab Orders  No laboratory test(s) ordered today     I will call if any are abnormal. All of your labs can be accessed through "My Chart".  I have place a referrals to none.  I have ordered the following tests: Mammogram.   I have ordered the following medication/changed the following medications:  1. None  Please follow-up in 2-3 months after your neurology appointment.   Should you have any questions or concerns please call the internal medicine clinic at [phone].    Marianna Payment, D.O. [facility]   My Chart Access: [email or web address]   If you have not already done so, please get your COVID 19 vaccine  To schedule an appointment for a COVID vaccine choice any of the following: Go to [email or web address]   Go to [email or web address]                  Call [phone]                                     Call [phone] and select Option 2

## 2020-05-04 NOTE — Progress Notes (Signed)
CC: Recurrent falls  HPI:  [name] is a 64 y.o. female with a past medical history stated below and presents today for recurrent falls. Please see problem based assessment and plan for additional details.  Past Medical History:  Diagnosis Date  . Allergic rhinitis   . Anxiety disorder   . Asthma   . Hypertension 04/24/2007   Qualifier: Diagnosis of  Problem Stop Reason:  By: Jerilee Hoh MD, Olam Idler    . Mitral valve prolapse   . Obsessive compulsive disorder   . Sinusitis    chronic  . Ulcerative colitis    sees Dr. Collene Mares - normal colonoscopy in Nov 2011    Current Outpatient Medications on File Prior to Visit  Medication Sig Dispense Refill  . acetaminophen (TYLENOL) 500 MG tablet Take 500 mg by mouth every 6 (six) hours as needed. For pain    . albuterol (PROAIR HFA) 108 (90 Base) MCG/ACT inhaler Inhale 2 puffs into the lungs every 4 (four) hours as needed for wheezing or shortness of breath. 18 g 1  . Ascorbic Acid (VITAMIN C PO) Take 1 tablet by mouth daily.    Marland Kitchen azelastine (ASTELIN) 0.1 % nasal spray USE ONE SPRAY IN EACH NOSTRIL TWICE DAILY IF NEEDED 30 mL 11  . beclomethasone (QVAR REDIHALER) 80 MCG/ACT inhaler Inhale 2 puffs into the lungs 2 (two) times daily. 10.6 g 11  . benzonatate (TESSALON) 100 MG capsule Take 1 capsule (100 mg total) by mouth 3 (three) times daily as needed for cough. 20 capsule 0  . CALCIUM PO Take by mouth daily.    . cetirizine (ZYRTEC) 10 MG tablet Take 10 mg by mouth daily.      . Cholecalciferol (VITAMIN D PO) Take by mouth daily.    . cyproheptadine (PERIACTIN) 4 MG tablet TAKE ONE-HALF TO ONE TABLET AT BEDTIME 30 tablet 11  . EPINEPHrine 0.3 mg/0.3 mL IJ SOAJ injection epinephrine 0.3 mg/0.3 mL injection, auto-injector    . flunisolide (NASALIDE) 25 MCG/ACT (0.025%) SOLN Place 1 spray into the nose 2 (two) times daily. 25 mL 5  . fluticasone (FLONASE) 50 MCG/ACT nasal spray Place 2 sprays into both nostrils daily. 16 g 5  .  hydrochlorothiazide (HYDRODIURIL) 12.5 MG tablet Take 1 tablet (12.5 mg total) by mouth daily. 30 tablet 3  . Melatonin 10 MG TABS Take 10 mg by mouth at bedtime as needed (insomnia). Take 2-3 hours before bedtime. 90 tablet 0  . mesalamine (CANASA) 1000 MG suppository Place 1,000 mg rectally daily as needed. Use as directed by your gastroenterologist for ulcerative colitis    . mesalamine (LIALDA) 1.2 G EC tablet Take 2.4 g by mouth daily.     . mometasone (NASONEX) 50 MCG/ACT nasal spray Place 1 spray into the nose in the morning and at bedtime. 17 g 5  . Multiple Vitamin (MULTIVITAMIN WITH MINERALS) TABS Take 1 tablet by mouth daily.    Marland Kitchen omeprazole (PRILOSEC) 20 MG capsule TAKE 1 CAPSULE(20 MG) BY MOUTH TWICE DAILY 60 capsule 5  . pantoprazole (PROTONIX) 40 MG tablet Take 1 tablet (40 mg total) by mouth daily. 30 tablet 5  . PARoxetine (PAXIL) 30 MG tablet Take 2 tablets (60 mg total) by mouth daily. 180 tablet 3  . pravastatin (PRAVACHOL) 20 MG tablet TAKE 1 TABLET(20 MG) BY MOUTH DAILY 90 tablet 0   No current facility-administered medications on file prior to visit.    Family History  Problem Relation Age of Onset  .  Hypertension Mother   . Hyperlipidemia Mother   . Asthma Mother   . Eczema Mother   . Diabetes Brother   . Asthma Brother   . Lymphoma Father   . Seizures Sister   . Asthma Sister   . Diabetes Maternal Grandmother   . Allergic rhinitis Neg Hx   . Angioedema Neg Hx   . Immunodeficiency Neg Hx     Social History   Socioeconomic History  . Marital status: Single    Spouse name: Not on file  . Number of children: Not on file  . Years of education: 49  . Highest education level: Not on file  Occupational History  . Occupation: Network engineer, Medical sales representative, Designer, fashion/clothing: [employer]  Tobacco Use  . Smoking status: Never Smoker  . Smokeless tobacco: Never Used  Vaping Use  . Vaping Use: Never used  Substance and Sexual Activity  . Alcohol use:  No    Alcohol/week: 0.0 standard drinks  . Drug use: No  . Sexual activity: Never  Other Topics Concern  . Not on file  Social History Narrative   Financial assistance approved for 85% discount at [facility] and has [facility] card per Bonna Gains   02/16/2010         Social Determinants of Health   Financial Resource Strain:   . Difficulty of Paying Living Expenses: Not on file  Food Insecurity:   . Worried About Charity fundraiser in the Last Year: Not on file  . Ran Out of Food in the Last Year: Not on file  Transportation Needs:   . Lack of Transportation (Medical): Not on file  . Lack of Transportation (Non-Medical): Not on file  Physical Activity:   . Days of Exercise per Week: Not on file  . Minutes of Exercise per Session: Not on file  Stress:   . Feeling of Stress : Not on file  Social Connections:   . Frequency of Communication with Friends and Family: Not on file  . Frequency of Social Gatherings with Friends and Family: Not on file  . Attends Religious Services: Not on file  . Active Member of Clubs or Organizations: Not on file  . Attends Archivist Meetings: Not on file  . Marital Status: Not on file  Intimate Partner Violence:   . Fear of Current or Ex-Partner: Not on file  . Emotionally Abused: Not on file  . Physically Abused: Not on file  . Sexually Abused: Not on file    Review of Systems: ROS negative except for what is noted on the assessment and plan.  Vitals:   05/04/20 1342  BP: 128/85  Pulse: 87  SpO2: 96%  Weight: 131 lb 14.4 oz (59.8 kg)  Height: 5' 3"  (1.6 m)     Physical Exam: Physical Exam Constitutional:      Appearance: Normal appearance.  HENT:     Head: Normocephalic and atraumatic.  Eyes:     Extraocular Movements: Extraocular movements intact.  Cardiovascular:     Rate and Rhythm: Normal rate.     Pulses: Normal pulses.     Heart sounds: Normal heart sounds.  Pulmonary:     Effort: Pulmonary effort is normal.      Breath sounds: Normal breath sounds.  Abdominal:     General: Bowel sounds are normal.     Palpations: Abdomen is soft.     Tenderness: There is no abdominal tenderness.  Musculoskeletal:  General: Normal range of motion.     Cervical back: Normal range of motion.     Right lower leg: No edema.     Left lower leg: No edema.  Skin:    General: Skin is warm and dry.  Neurological:     General: No focal deficit present.     Mental Status: She is alert and oriented to person, place, and time. Mental status is at baseline.     Cranial Nerves: No cranial nerve deficit.     Sensory: No sensory deficit.     Motor: No weakness.     Coordination: Coordination normal.     Gait: Gait abnormal (shuffle gait).     Deep Tendon Reflexes: Reflexes normal.  Psychiatric:        Mood and Affect: Mood is anxious.        Speech: Speech is rapid and pressured.        Behavior: Behavior is hyperactive.      Assessment & Plan:   See Encounters Tab for problem based charting.  Patient discussed with Dr. Bertell Maria, D.O. [facility], PGY-2 Pager: [phone], Phone: [phone] Date 05/05/2020 Time 7:29 AM

## 2020-05-05 ENCOUNTER — Encounter: Payer: Self-pay | Admitting: Internal Medicine

## 2020-05-05 NOTE — Assessment & Plan Note (Signed)
Patient presents with a history of unspecified anxiety disorder.  During her last visit her Paxil was increased to 60 mg daily and she was referred to a counselor.  Patient states that she is not sure if she has had any benefit from the increase in her medication and was unable to follow-up with a counselor.  Patient was previously diagnosed with obsessive-compulsive disorder and admits to having a difficult time with what she describes as her "checking issue".  Otherwise she does not have great insight into her psychiatric illness.  On exam today the patient does have pressured speech, seems to fidget in her chair, and has flight of ideas.  She also describes having difficulty with sleeping at night needing cyproheptadine to sleep.  I am concerned the patient may be experiencing some hypomanic symptoms that could be worsened by her SSRI.  I inquired about her desire to see a counselor/psychiatrist in the future.  She seemed puzzled by the need to see a psychiatrist. I counseled her on my concern regarding her symptoms but she states that she would not like to make any changes to her medications or seek psychiatric help at this time.     Plan: -Likely need psychiatric referral in the near future with further medication management.

## 2020-05-05 NOTE — Assessment & Plan Note (Deleted)
Patient presents with a history of unspecified anxiety disorder.  During her last visit her Paxil was increased to 60 mg daily and she was referred to a counselor.  Patient states that she is not sure if she has had any benefit from the increase in her medication and was unable to follow-up with a counselor.  On exam today the patient does have pressured speech, seems to fidget in her chair, and has flight of ideas.  I am concerned the patient may be experiencing some hypomanic symptoms that could be worsened by her SSRI.  I inquired about her desire to see a counselor/psychiatrist in the future.  She seemed puzzled by the need to see a psychiatrist. I counseled her on my concern regarding her symptoms but she states that she would not like to make any changes to her medications or seek psychiatric help at this time.

## 2020-05-05 NOTE — Assessment & Plan Note (Signed)
Patient states that she follows with Dr. Collene Mares and currently taking mesalamine and tolerating well.  Patient states that she is overdue for a colonoscopy but will follow up with a clinic in the near future.

## 2020-05-05 NOTE — Assessment & Plan Note (Signed)
Patient states that she has had a difficult time with multiple falls over the last several months to years (since 2018).  Patient states that she has been seeing orthopedist for this reason and they recommended several exercises to help her avoid falling in the future.  Patient does admit to feeling weakness/heaviness in her lower extremities particularly when going up stairs she denies any numbness, tingling, or tremors.  She states that the orthopedist just recently did blood work. I will follow-up with their office to get a copy of the blood work.  Patient states that she does have a neurology appointment in November to further delineate the etiology of her recurrent falls.  On exam patient has 5 out of 5 strength in bilateral upper and lower extremities with 2+ peripheral pulses bilaterally, intact sensation and deep tendon reflexes.   Plan: -I counseled her on the importance of keeping her neurology follow-up.

## 2020-05-05 NOTE — Assessment & Plan Note (Signed)
Patient states that she gets her mammogram done yearly and is scheduled for her next mammogram in November.

## 2020-05-07 ENCOUNTER — Telehealth: Payer: Self-pay | Admitting: *Deleted

## 2020-05-07 NOTE — Telephone Encounter (Signed)
Call made to [facility]-pt signed ROI on last visit-CMA can see office notes in "care everywhere", but not the labs pt states she had drawn.  Message left with medical records dept requesting most recent lab results.Christina Burton, Joni Colegrove Cassady10/7/20219:10 AM

## 2020-05-07 NOTE — Progress Notes (Signed)
Internal Medicine Clinic Attending  Case discussed with Dr. Coe  At the time of the visit.  We reviewed the resident's history and exam and pertinent patient test results.  I agree with the assessment, diagnosis, and plan of care documented in the resident's note.  

## 2020-05-14 DIAGNOSIS — M7711 Lateral epicondylitis, right elbow: Secondary | ICD-10-CM | POA: Diagnosis not present

## 2020-05-14 DIAGNOSIS — M65331 Trigger finger, right middle finger: Secondary | ICD-10-CM | POA: Diagnosis not present

## 2020-05-14 DIAGNOSIS — M25511 Pain in right shoulder: Secondary | ICD-10-CM | POA: Diagnosis not present

## 2020-05-25 ENCOUNTER — Ambulatory Visit (INDEPENDENT_AMBULATORY_CARE_PROVIDER_SITE_OTHER): Payer: BC Managed Care – PPO

## 2020-05-25 DIAGNOSIS — J309 Allergic rhinitis, unspecified: Secondary | ICD-10-CM | POA: Diagnosis not present

## 2020-05-28 DIAGNOSIS — M65331 Trigger finger, right middle finger: Secondary | ICD-10-CM | POA: Diagnosis not present

## 2020-05-28 DIAGNOSIS — M25511 Pain in right shoulder: Secondary | ICD-10-CM | POA: Diagnosis not present

## 2020-06-01 DIAGNOSIS — J3089 Other allergic rhinitis: Secondary | ICD-10-CM | POA: Diagnosis not present

## 2020-06-01 NOTE — Progress Notes (Signed)
VIALS EXP 06-01-21 °

## 2020-06-23 ENCOUNTER — Telehealth: Payer: Self-pay | Admitting: Allergy and Immunology

## 2020-06-23 ENCOUNTER — Ambulatory Visit (INDEPENDENT_AMBULATORY_CARE_PROVIDER_SITE_OTHER): Payer: BC Managed Care – PPO | Admitting: *Deleted

## 2020-06-23 ENCOUNTER — Encounter: Payer: Self-pay | Admitting: *Deleted

## 2020-06-23 DIAGNOSIS — J309 Allergic rhinitis, unspecified: Secondary | ICD-10-CM | POA: Diagnosis not present

## 2020-06-23 NOTE — Telephone Encounter (Signed)
Please see below Dr. Neldon Mc:

## 2020-06-23 NOTE — Telephone Encounter (Signed)
Patient would like a written prescription for pantoprazole for 3 months. Patient states she is not going to have insurance the beginning of the year and would like to go ahead and have this medication. Please call patient when/if written prescription is ready.  Please advise.

## 2020-06-23 NOTE — Telephone Encounter (Signed)
Please provide [place] the prescription she requests.

## 2020-06-24 ENCOUNTER — Other Ambulatory Visit: Payer: Self-pay

## 2020-06-24 DIAGNOSIS — Z1231 Encounter for screening mammogram for malignant neoplasm of breast: Secondary | ICD-10-CM | POA: Diagnosis not present

## 2020-06-24 MED ORDER — PANTOPRAZOLE SODIUM 40 MG PO TBEC: 40.0000 mg | DELAYED_RELEASE_TABLET | Freq: Every day | ORAL | 0 refills | Status: DC

## 2020-06-24 NOTE — Telephone Encounter (Signed)
Patient requesting prescription to be printed. Patient will stop by today to pick it up.

## 2020-06-29 ENCOUNTER — Ambulatory Visit: Payer: BC Managed Care – PPO | Admitting: Neurology

## 2020-06-29 ENCOUNTER — Encounter: Payer: Self-pay | Admitting: Neurology

## 2020-06-29 VITALS — BP 121/83 | HR 83 | Ht 63.0 in | Wt 133.0 lb

## 2020-06-29 DIAGNOSIS — Z0000 Encounter for general adult medical examination without abnormal findings: Secondary | ICD-10-CM

## 2020-06-29 DIAGNOSIS — R269 Unspecified abnormalities of gait and mobility: Secondary | ICD-10-CM

## 2020-06-29 HISTORY — DX: Encounter for general adult medical examination without abnormal findings: Z00.00

## 2020-06-29 HISTORY — DX: Unspecified abnormalities of gait and mobility: R26.9

## 2020-06-29 NOTE — Patient Instructions (Signed)

## 2020-06-29 NOTE — Progress Notes (Signed)
Provider:  Larey Seat, MD,  Neurophysiology, Sleep and EEG.     Referring Provider: Marisa Sprinkles  Primary Care Physician:  Marianna Payment, MD  Chief Complaint  Patient presents with  . New Patient (Initial Visit)    RM 11, alone. Change in gait .    HPI:  Christina Burton is a 64 y.o. female  was seen here on 06-29-2020 upon a consultation requested by Marisa Sprinkles ,  from [place],  for " Changes in Gait".  Ms. Poche states that she has had ongoing issues sh e has been treated by Dr. Dossie Der for the last 5 years plus for sciatica which is a chronic condition in her case she has an acquired trigger finger of the right middle the last note is dated 04-20-2020 and stated that multiple rheumatoid laboratory tests were requested including ANA, rheumatoid factor, sed rate, TSH, HLA-B27, uric acid level, cyclic citrulline aided peptide, CBC, urine analysis, angiotensin converting enzyme, CK-MB, Lyme disease testing and a comprehensive metabolic panel were apparently included not all of them could be called up on the patient's smart phone today., she has chronic right shoulder joint pain onset January 2020 as documented she has be lateral epicondylitis, sacroiliac joint pain, she also has degeneration of the lumbar intervertebral discs, and again pain in the elbow was extra listed.  She was especially examined as to the right knee knee in July it was 2021 and the results there were reassuring no focal instability her orthopedic felt that it was perhaps a mild foot drop that had led to the gait abnormality.  She has chronic left greater than right knee pain and feels that her knees buckle at times.  She had reported multiple falls.  Bilateral foot and ankle pain and burning sensation in the feet.  She had no formal training treatments in regards to her knee condition no previous surgery.  On exam she showed left knee physiologic valgus alignment.  No focal tenderness to the anterior aspect  of her knees.  Mildly tender on the lateral and medial joint line.  Normal deep tendon reflexes bilaterally throughout but her orthopedist also noticed that she hyperextends both knees when moving from stance phase to swing phase in her gait exam and he noted a limited dorsiflexion at the ankle of both feet.  Again she has been a patient of Dr. Nelva Bush who had seen her for lumbar spine issues.  Dr. Hewitt/ his PA were  the last orthopedists who saw her in the office after Dr Nelva Bush.  The PA ordered ANA, rheumatoid factor, sed rate, TSH, uric acid, HLA-B27, citrulline neck peptide, CBC with differential, comprehensive metabolic panel, Lyme disease antibodies, creatinine kinase, angiotensin-converting enzyme and a urine analysis microscopic.  The referral is literally based on the following : 1) pain in both feet,2) bilateral heel pain, 3)abnormal gait unspecified abnormalities. =Neurologist referral.  Recent rheumatological tests through her orthopedic office showed negative HLA B 27, but normal uric acid level, her rheumatoid factor returned negative.  She had a normal CBC with differential, these labs were provided by the patient on her smart phone as they were obtained through [employer].  The patient has a medical history of ULCERATIVE COLITIS, an autoimmune disorder. Asthma, allergy related. Sciatica and trigger-fingers, osteoarthritis, plantar fasciitis..     Review of Systems: Out of a complete 14 system review, the patient complains of only the following symptoms, and all other reviewed systems are negative.  "I  have multiple joint pains and sciatica. I don't fall- I slide "   Social History   Socioeconomic History  . Marital status: Single    Spouse name: Not on file  . Number of children: Not on file  . Years of education: 29  . Highest education level: Not on file  Occupational History  . Occupation: Network engineer, Medical sales representative, Designer, fashion/clothing: [employer]    Tobacco Use  . Smoking status: Never Smoker  . Smokeless tobacco: Never Used  Vaping Use  . Vaping Use: Never used  Substance and Sexual Activity  . Alcohol use: No    Alcohol/week: 0.0 standard drinks  . Drug use: No  . Sexual activity: Never  Other Topics Concern  . Not on file  Social History Narrative   Financial assistance approved for 85% discount at [facility] and has [facility] card per Bonna Gains   02/16/2010         Social Determinants of Health   Financial Resource Strain:   . Difficulty of Paying Living Expenses: Not on file  Food Insecurity:   . Worried About Charity fundraiser in the Last Year: Not on file  . Ran Out of Food in the Last Year: Not on file  Transportation Needs:   . Lack of Transportation (Medical): Not on file  . Lack of Transportation (Non-Medical): Not on file  Physical Activity:   . Days of Exercise per Week: Not on file  . Minutes of Exercise per Session: Not on file  Stress:   . Feeling of Stress : Not on file  Social Connections:   . Frequency of Communication with Friends and Family: Not on file  . Frequency of Social Gatherings with Friends and Family: Not on file  . Attends Religious Services: Not on file  . Active Member of Clubs or Organizations: Not on file  . Attends Archivist Meetings: Not on file  . Marital Status: Not on file  Intimate Partner Violence:   . Fear of Current or Ex-Partner: Not on file  . Emotionally Abused: Not on file  . Physically Abused: Not on file  . Sexually Abused: Not on file    Family History  Problem Relation Age of Onset  . Hypertension Mother   . Hyperlipidemia Mother   . Asthma Mother   . Eczema Mother   . Diabetes Brother   . Asthma Brother   . Lymphoma Father   . Seizures Sister   . Asthma Sister   . Diabetes Maternal Grandmother   . Allergic rhinitis Neg Hx   . Angioedema Neg Hx   . Immunodeficiency Neg Hx     Past Medical History:  Diagnosis Date  . Abnormal gait   .  Allergic rhinitis   . Anxiety disorder   . Asthma   . Asthma   . Foot pain, bilateral   . GERD (gastroesophageal reflux disease)   . High cholesterol   . Hypertension 04/24/2007   Qualifier: Diagnosis of  Problem Stop Reason:  By: Jerilee Hoh MD, Olam Idler    . Mitral valve prolapse   . Obsessive compulsive disorder   . Osteoarthritis   . Sinusitis    chronic  . Ulcerative colitis    sees Dr. Collene Mares - normal colonoscopy in Nov 2011    Past Surgical History:  Procedure Laterality Date  . CARPAL TUNNEL RELEASE    . HERNIA REPAIR  1959  . TONSILLECTOMY AND ADENOIDECTOMY    . TRIGGER  FINGER RELEASE     right thumb    Current Outpatient Medications  Medication Sig Dispense Refill  . acetaminophen (TYLENOL) 500 MG tablet Take 500 mg by mouth every 6 (six) hours as needed. For pain    . albuterol (PROAIR HFA) 108 (90 Base) MCG/ACT inhaler Inhale 2 puffs into the lungs every 4 (four) hours as needed for wheezing or shortness of breath. 18 g 1  . Ascorbic Acid (VITAMIN C PO) Take 1 tablet by mouth daily.    Marland Kitchen azelastine (ASTELIN) 0.1 % nasal spray USE ONE SPRAY IN EACH NOSTRIL TWICE DAILY IF NEEDED 30 mL 11  . beclomethasone (QVAR REDIHALER) 80 MCG/ACT inhaler Inhale 2 puffs into the lungs 2 (two) times daily. 10.6 g 11  . CALCIUM PO Take by mouth daily.    . cetirizine (ZYRTEC) 10 MG tablet Take 10 mg by mouth daily.      . Cholecalciferol (VITAMIN D PO) Take by mouth daily.    . cyproheptadine (PERIACTIN) 4 MG tablet TAKE ONE-HALF TO ONE TABLET AT BEDTIME 30 tablet 11  . EPINEPHrine 0.3 mg/0.3 mL IJ SOAJ injection epinephrine 0.3 mg/0.3 mL injection, auto-injector    . flunisolide (NASALIDE) 25 MCG/ACT (0.025%) SOLN Place 1 spray into the nose 2 (two) times daily. 25 mL 5  . fluticasone (FLONASE) 50 MCG/ACT nasal spray Place 2 sprays into both nostrils daily. 16 g 5  . hydrochlorothiazide (HYDRODIURIL) 12.5 MG tablet Take 1 tablet (12.5 mg total) by mouth daily. 30 tablet 3  . Melatonin 10  MG TABS Take 10 mg by mouth at bedtime as needed (insomnia). Take 2-3 hours before bedtime. 90 tablet 0  . mesalamine (CANASA) 1000 MG suppository Place 1,000 mg rectally daily as needed. Use as directed by your gastroenterologist for ulcerative colitis    . mesalamine (LIALDA) 1.2 G EC tablet Take 2.4 g by mouth daily.     . mometasone (NASONEX) 50 MCG/ACT nasal spray Place 1 spray into the nose in the morning and at bedtime. 17 g 5  . Multiple Vitamin (MULTIVITAMIN WITH MINERALS) TABS Take 1 tablet by mouth daily.    Marland Kitchen omeprazole (PRILOSEC) 20 MG capsule TAKE 1 CAPSULE(20 MG) BY MOUTH TWICE DAILY 60 capsule 5  . pantoprazole (PROTONIX) 40 MG tablet Take 1 tablet (40 mg total) by mouth daily. 90 tablet 0  . PARoxetine (PAXIL) 30 MG tablet Take 2 tablets (60 mg total) by mouth daily. 180 tablet 3  . pravastatin (PRAVACHOL) 20 MG tablet TAKE 1 TABLET(20 MG) BY MOUTH DAILY 90 tablet 0   No current facility-administered medications for this visit.    Allergies as of 06/29/2020 - Review Complete 06/29/2020  Allergen Reaction Noted  . Amoxicillin Hives 06/03/2010  . Articaine  10/29/2010  . Atenolol Hives 06/03/2010  . Sulfonamide derivatives    . Trimox [amoxicillin trihydrate]  05/26/2011    Vitals: BP 121/83   Pulse 83   Ht 5' 3"  (1.6 m)   Wt 133 lb (60.3 kg)   LMP 03/01/2010   BMI 23.56 kg/m  Last Weight:  Wt Readings from Last 1 Encounters:  06/29/20 133 lb (60.3 kg)   Last Height:   Ht Readings from Last 1 Encounters:  06/29/20 5' 3"  (1.6 m)    Physical exam:  General: The patient is awake, alert and appears not in acute distress. The patient is pale .  Head: Normocephalic, atraumatic. Neck is supple. Cardiovascular:  Regular rate and rhythm, without carotid bruit, with a MVP  click. Respiratory: Lungs are clear to auscultation. Skin:  Without evidence of edema, or rash Trunk: BMI is 23 and patient  has normal posture.  Neurologic exam : The patient is awake and  alert, oriented to place and time.   Memory subjective  described as intact. There is a normal attention span & concentration ability. Speech is fluent without  dysarthria, dysphonia or aphasia. Mood and affect are appropriate.  Cranial nerves: Pupils are equal and briskly reactive to light. Funduscopic exam without evidence of pallor or edema, beginning cataracts bilaterally. Extraocular movements  in vertical and horizontal planes intact and without nystagmus.  Visual fields by finger perimetry are intact. Hearing to finger rub intact. Facial sensation intact to fine touch. Facial motor strength is symmetric and tongue and uvula move midline. Tongue protrusion into either cheek is normal. Shoulder shrug is normal.   Motor exam:   Normal tone ,muscle bulk and symmetric strength in all extremities.  Sensory:  Fine touch, pinprick and vibration were tested in all extremities. Proprioception was normal. She felt vibration down to the great toes bilaterally.   Coordination: Rapid alternating movements in the fingers/hands were normal. Finger-to-nose maneuver  normal without evidence of ataxia, dysmetria or tremor.  Gait and station: Patient walks without assistive device and is able unassisted to climb up to the exam table. Strength within normal limits. Stance is stable and normal based. Able to perform HEEL and TOEWALK- no foot drop. Turns with 3 steps. .  Tandem gait is unfragmented. Romberg testing isnegative   Deep tendon reflexes: in the upper and lower extremities are symmetric and intact. There was a strong achilles tendon reflex, contradicting a neuropathic origin of her foot dysesthesias. Babinski maneuver response is downgoing.   Assessment:  After physical and neurologic examination, review of laboratory studies,testing her pre-existing records, assessment is that of :   There was no gait impairment noted, the patient was able to walk on heel and toes, has prompt and brisk reflexes,  no gait instability.   She reports chronic lower back pain, multiple joint pain.   Plan:  Treatment plan and additional workup :  No neurological gait disorder identified, no shuffling gait present.     Asencion Partridge Dori Devino MD 06/29/2020

## 2020-07-14 ENCOUNTER — Ambulatory Visit (INDEPENDENT_AMBULATORY_CARE_PROVIDER_SITE_OTHER): Payer: BC Managed Care – PPO

## 2020-07-14 DIAGNOSIS — J309 Allergic rhinitis, unspecified: Secondary | ICD-10-CM | POA: Diagnosis not present

## 2020-07-19 ENCOUNTER — Other Ambulatory Visit: Payer: Self-pay | Admitting: Internal Medicine

## 2020-07-20 ENCOUNTER — Encounter: Payer: Self-pay | Admitting: Internal Medicine

## 2020-07-22 ENCOUNTER — Other Ambulatory Visit: Payer: Self-pay | Admitting: *Deleted

## 2020-07-22 DIAGNOSIS — I10 Essential (primary) hypertension: Secondary | ICD-10-CM

## 2020-07-22 MED ORDER — HYDROCHLOROTHIAZIDE 12.5 MG PO TABS: 12.5000 mg | ORAL_TABLET | Freq: Every day | ORAL | 0 refills | Status: DC

## 2020-07-22 NOTE — Addendum Note (Signed)
Addended by: Molli Hazard A on: 07/22/2020 05:06 PM   Modules accepted: Orders

## 2020-07-27 ENCOUNTER — Telehealth: Payer: Self-pay

## 2020-07-27 NOTE — Telephone Encounter (Signed)
HCTZ was refilled 07/22/20 x 90 tabs - pt was called /informed. Stated the pharmacy had told her qty of #30; stated she will call the pharmacy.

## 2020-07-27 NOTE — Telephone Encounter (Signed)
Pt is wanting her hydrochlorothiazide (HYDRODIURIL) 12.5 MG tablet to be refilled but instead of 30 she is requesting 90 day supply before her insurance runs out . Pt want it to be send to  [facility] [place] - [address] Phone:  [phone]  Fax:  [phone]

## 2020-07-29 DIAGNOSIS — M533 Sacrococcygeal disorders, not elsewhere classified: Secondary | ICD-10-CM | POA: Diagnosis not present

## 2020-08-11 ENCOUNTER — Ambulatory Visit (INDEPENDENT_AMBULATORY_CARE_PROVIDER_SITE_OTHER): Payer: BC Managed Care – PPO

## 2020-08-11 DIAGNOSIS — J309 Allergic rhinitis, unspecified: Secondary | ICD-10-CM

## 2020-09-10 ENCOUNTER — Ambulatory Visit (INDEPENDENT_AMBULATORY_CARE_PROVIDER_SITE_OTHER): Payer: Self-pay

## 2020-09-10 DIAGNOSIS — J309 Allergic rhinitis, unspecified: Secondary | ICD-10-CM

## 2020-09-17 ENCOUNTER — Ambulatory Visit (INDEPENDENT_AMBULATORY_CARE_PROVIDER_SITE_OTHER): Payer: Self-pay | Admitting: *Deleted

## 2020-09-17 DIAGNOSIS — J309 Allergic rhinitis, unspecified: Secondary | ICD-10-CM

## 2020-09-24 ENCOUNTER — Ambulatory Visit (INDEPENDENT_AMBULATORY_CARE_PROVIDER_SITE_OTHER): Payer: Self-pay

## 2020-09-24 DIAGNOSIS — J309 Allergic rhinitis, unspecified: Secondary | ICD-10-CM

## 2020-09-29 ENCOUNTER — Ambulatory Visit (INDEPENDENT_AMBULATORY_CARE_PROVIDER_SITE_OTHER): Payer: Self-pay

## 2020-09-29 DIAGNOSIS — J309 Allergic rhinitis, unspecified: Secondary | ICD-10-CM

## 2020-10-08 ENCOUNTER — Ambulatory Visit (INDEPENDENT_AMBULATORY_CARE_PROVIDER_SITE_OTHER): Payer: Self-pay

## 2020-10-08 DIAGNOSIS — J309 Allergic rhinitis, unspecified: Secondary | ICD-10-CM

## 2020-10-17 ENCOUNTER — Other Ambulatory Visit: Payer: Self-pay | Admitting: Internal Medicine

## 2020-11-03 ENCOUNTER — Ambulatory Visit (INDEPENDENT_AMBULATORY_CARE_PROVIDER_SITE_OTHER): Payer: Self-pay | Admitting: *Deleted

## 2020-11-03 DIAGNOSIS — J309 Allergic rhinitis, unspecified: Secondary | ICD-10-CM

## 2020-11-17 ENCOUNTER — Other Ambulatory Visit: Payer: Self-pay | Admitting: Allergy and Immunology

## 2020-11-17 ENCOUNTER — Telehealth: Payer: Self-pay

## 2020-11-17 NOTE — Telephone Encounter (Signed)
Patient was transferred to the front office to schedule an appointment.  Pantoprazole was sent in to [facility] on [address].

## 2020-11-22 ENCOUNTER — Other Ambulatory Visit: Payer: Self-pay | Admitting: Internal Medicine

## 2020-11-22 DIAGNOSIS — I10 Essential (primary) hypertension: Secondary | ICD-10-CM

## 2020-12-03 ENCOUNTER — Ambulatory Visit (INDEPENDENT_AMBULATORY_CARE_PROVIDER_SITE_OTHER): Payer: Self-pay | Admitting: *Deleted

## 2020-12-03 DIAGNOSIS — J309 Allergic rhinitis, unspecified: Secondary | ICD-10-CM

## 2020-12-15 ENCOUNTER — Ambulatory Visit: Payer: Self-pay | Admitting: Allergy and Immunology

## 2020-12-15 ENCOUNTER — Encounter: Payer: Self-pay | Admitting: Allergy and Immunology

## 2020-12-15 ENCOUNTER — Other Ambulatory Visit: Payer: Self-pay

## 2020-12-15 VITALS — BP 110/78 | HR 80 | Temp 97.9°F | Resp 16 | Ht 62.5 in | Wt 133.8 lb

## 2020-12-15 DIAGNOSIS — J4530 Mild persistent asthma, uncomplicated: Secondary | ICD-10-CM

## 2020-12-15 DIAGNOSIS — J3089 Other allergic rhinitis: Secondary | ICD-10-CM

## 2020-12-15 DIAGNOSIS — G4720 Circadian rhythm sleep disorder, unspecified type: Secondary | ICD-10-CM

## 2020-12-15 DIAGNOSIS — K219 Gastro-esophageal reflux disease without esophagitis: Secondary | ICD-10-CM

## 2020-12-15 MED ORDER — ALBUTEROL SULFATE HFA 108 (90 BASE) MCG/ACT IN AERS: 2.0000 | INHALATION_SPRAY | RESPIRATORY_TRACT | 1 refills | Status: DC | PRN

## 2020-12-15 MED ORDER — QVAR REDIHALER 80 MCG/ACT IN AERB: 2.0000 | INHALATION_SPRAY | Freq: Two times a day (BID) | RESPIRATORY_TRACT | 11 refills | Status: DC

## 2020-12-15 MED ORDER — AZELASTINE HCL 0.1 % NA SOLN: NASAL | 11 refills | Status: DC

## 2020-12-15 MED ORDER — CYPROHEPTADINE HCL 4 MG PO TABS: ORAL_TABLET | ORAL | 11 refills | Status: DC

## 2020-12-15 NOTE — Progress Notes (Signed)
Christina Burton - [place] [facility] [place]   Follow-up Note  Referring Provider: Marianna Payment, MD Primary Provider: Marianna Payment, MD Date of Office Visit: 12/15/2020  Subjective:   Christina Burton (DOB: 1956/01/28) is a 65 y.o. female who returns to the [place] on 12/15/2020 in re-evaluation of the following:  HPI: [name] returns to this clinic in reevaluation of asthma and allergic rhinoconjunctivitis and a history of LPR and sleep dysfunction.  Her last visit to this clinic was 19 November 2019.  Overall her asthma has been under very good control while using Qvar just a few times per week and rarely if ever using a short acting bronchodilator.  She has not required a systemic steroid to treat any type of asthma issue.  Her nose is doing quite well at this point in time as long as she continues to use nasal fluticasone.  Occasionally she will add in azelastine.  She has not required an antibiotic to treat an episode of sinusitis.  Her eyes have been causing her lots of problem this spring.  She does have a history of dry eye syndrome but there has been a lot of burning and a lot of grittiness and irritation.  She uses Hydrologist which helps.  Her reflux is under pretty good control while using omeprazole.  She does better using this agent twice a day then using once a day but because of an expense issue usually ends up using it just 1 time per day.  She sleeps really well if she uses Periactin which she can only tolerate using on the weekends.  She uses 2 mg of Periactin on Friday night and Saturday night.  She still feels kind of fatigued in general throughout the day.  She has never had a sleep study.  She tells me that she has had her thyroid checked at some point in the past year.  Her immunotherapy is currently every 4 weeks without any adverse effect.  She has had 2 Moderna COVID vaccines.   Allergies as of 12/15/2020      Reactions    Amoxicillin Hives   Articaine    Caused numbness that too long - lasted about 6 months when used by dentist   Atenolol Hives   Sulfonamide Derivatives    REACTION: Unknown reaction   Trimox [amoxicillin Trihydrate]       Medication List    acetaminophen 500 MG tablet Commonly known as: TYLENOL Take 500 mg by mouth every 6 (six) hours as needed. For pain   albuterol 108 (90 Base) MCG/ACT inhaler Commonly known as: ProAir HFA Inhale 2 puffs into the lungs every 4 (four) hours as needed for wheezing or shortness of breath.   azelastine 0.1 % nasal spray Commonly known as: Astelin USE ONE SPRAY IN EACH NOSTRIL TWICE DAILY IF NEEDED   CALCIUM PO Take by mouth daily.   cetirizine 10 MG tablet Commonly known as: ZYRTEC Take 10 mg by mouth daily.   cyproheptadine 4 MG tablet Commonly known as: PERIACTIN TAKE ONE-HALF TO ONE TABLET AT BEDTIME   EPINEPHrine 0.3 mg/0.3 mL Soaj injection Commonly known as: EPI-PEN epinephrine 0.3 mg/0.3 mL injection, auto-injector   fluticasone 50 MCG/ACT nasal spray Commonly known as: FLONASE Place 2 sprays into both nostrils daily.   hydrochlorothiazide 12.5 MG tablet Commonly known as: HYDRODIURIL TAKE 1 TABLET(12.5 MG) BY MOUTH DAILY   Melatonin 10 MG Tabs Take 10 mg by mouth at bedtime  as needed (insomnia). Take 2-3 hours before bedtime.   mesalamine 1000 MG suppository Commonly known as: CANASA Place 1,000 mg rectally daily as needed. Use as directed by your gastroenterologist for ulcerative colitis   mesalamine 1.2 g EC tablet Commonly known as: LIALDA Take 2.4 g by mouth daily.   mometasone 50 MCG/ACT nasal spray Commonly known as: Nasonex Place 1 spray into the nose in the morning and at bedtime.   multivitamin with minerals Tabs tablet Take 1 tablet by mouth daily.   pantoprazole 40 MG tablet Commonly known as: PROTONIX TAKE 1 TABLET BY MOUTH EVERY DAY   PARoxetine 30 MG tablet Commonly known as: PAXIL Take 2  tablets (60 mg total) by mouth daily.   pravastatin 20 MG tablet Commonly known as: PRAVACHOL TAKE 1 TABLET(20 MG) BY MOUTH DAILY   Qvar RediHaler 80 MCG/ACT inhaler Generic drug: beclomethasone Inhale 2 puffs into the lungs 2 (two) times daily.   VITAMIN C PO Take 1 tablet by mouth daily.   VITAMIN D PO Take by mouth daily.       Past Medical History:  Diagnosis Date  . Abnormal gait   . Allergic rhinitis   . Anxiety disorder   . Asthma   . Asthma   . Foot pain, bilateral   . GERD (gastroesophageal reflux disease)   . High cholesterol   . Hypertension 04/24/2007   Qualifier: Diagnosis of  Problem Stop Reason:  By: Jerilee Hoh MD, Olam Idler    . Mitral valve prolapse   . Obsessive compulsive disorder   . Osteoarthritis   . Sinusitis    chronic  . Ulcerative colitis    sees Dr. Collene Mares - normal colonoscopy in Nov 2011    Past Surgical History:  Procedure Laterality Date  . ABDOMINAL HYSTERECTOMY    . CARPAL TUNNEL RELEASE    . HERNIA REPAIR  1959  . TONSILLECTOMY AND ADENOIDECTOMY    . TRIGGER FINGER RELEASE     right thumb    Review of systems negative except as noted in HPI / PMHx or noted below:  Review of Systems  Constitutional: Negative.   HENT: Negative.   Eyes: Negative.   Respiratory: Negative.   Cardiovascular: Negative.   Gastrointestinal: Negative.   Genitourinary: Negative.   Musculoskeletal: Negative.   Skin: Negative.   Neurological: Negative.   Endo/Heme/Allergies: Negative.   Psychiatric/Behavioral: Negative.      Objective:   Vitals:   12/15/20 1507  BP: 110/78  Pulse: 80  Resp: 16  Temp: 97.9 F (36.6 C)  SpO2: 95%   Height: 5' 2.5" (158.8 cm)  Weight: 133 lb 12.8 oz (60.7 kg)   Physical Exam Constitutional:      Appearance: She is not diaphoretic.  HENT:     Head: Normocephalic.     Right Ear: Tympanic membrane, ear canal and external ear normal.     Left Ear: Tympanic membrane, ear canal and external ear normal.      Nose: Nose normal. No mucosal edema or rhinorrhea.     Mouth/Throat:     Pharynx: Uvula midline. No oropharyngeal exudate.  Eyes:     Conjunctiva/sclera: Conjunctivae normal.  Neck:     Thyroid: No thyromegaly.     Trachea: Trachea normal. No tracheal tenderness or tracheal deviation.  Cardiovascular:     Rate and Rhythm: Normal rate and regular rhythm.     Heart sounds: Normal heart sounds, S1 normal and S2 normal. No murmur heard.   Pulmonary:  Effort: No respiratory distress.     Breath sounds: Normal breath sounds. No stridor. No wheezing or rales.  Lymphadenopathy:     Head:     Right side of head: No tonsillar adenopathy.     Left side of head: No tonsillar adenopathy.     Cervical: No cervical adenopathy.  Skin:    Findings: No erythema or rash.     Nails: There is no clubbing.  Neurological:     Mental Status: She is alert.      Diagnostics:    Spirometry was performed and demonstrated an FEV1 of 1.89 at 81 % of predicted.  The patient had an Asthma Control Test with the following results: ACT Total Score: 22.    Assessment and Plan:   1. Asthma, well controlled, mild persistent   2. Other allergic rhinitis   3. LPRD (laryngopharyngeal reflux disease)   4. Dysfunction of sleep stage or arousal      1. Continue Qvar 80 REDIHALER 2 inhalations 1 time per day   2. Continue Fluticasone one spray each nostril 1 time per day  3. Continue immunotherapy and EpiPen  4. Continue omeprazole 20 mg 1-2 times a day  5. Continue Cyproheptadine 22m one half tablet at bedtime    6. Continue nasal Astelin, nasal saline, Zyrtec, Proventil HFA, Mucinex DM, Systane and Zaditor if needed  7. Return to clinic in 1 year or earlier if problem  Overall NMeikais doing relatively well on her current plan which includes very low doses of anti-inflammatory agents for airway and continued use of immunotherapy to address her atopic disease.  Her reflux appears to be okay with  using omeprazole at this point in time.  Her sleep dysfunction responds to the use of cyproheptadine but she can only tolerate this medication 2 days a week on Friday and Saturday.  She has a lot of fatigue and she needs to follow-up with her primary care doctor regarding this issue.  I think in an ideal world if she had insurance she would obtain a sleep study to exclude the possibility of sleep apnea contributing to this issue but she cannot really afford that study at this point.  I will see her back in this clinic in 1 year or earlier if there is a problem.  EAllena Katz MD Allergy / Immunology [facility]

## 2020-12-15 NOTE — Patient Instructions (Addendum)
  1. Continue Qvar 80 REDIHALER 2 inhalations 1 time per day   2. Continue Fluticasone one spray each nostril 1 time per day  3. Continue immunotherapy and EpiPen  4. Continue omeprazole 20 mg 1-2 times a day  5. Continue Cyproheptadine 51m one half tablet at bedtime    6. Continue nasal Astelin, nasal saline, Zyrtec, Proventil HFA, Mucinex DM, Systane and Zaditor if needed  7. Return to clinic in 1 year or earlier if problem

## 2020-12-16 ENCOUNTER — Encounter: Payer: Self-pay | Admitting: Allergy and Immunology

## 2020-12-29 ENCOUNTER — Ambulatory Visit (INDEPENDENT_AMBULATORY_CARE_PROVIDER_SITE_OTHER): Payer: Self-pay | Admitting: *Deleted

## 2020-12-29 DIAGNOSIS — J309 Allergic rhinitis, unspecified: Secondary | ICD-10-CM

## 2020-12-31 ENCOUNTER — Encounter: Payer: Self-pay | Admitting: Internal Medicine

## 2021-01-19 ENCOUNTER — Encounter: Payer: Self-pay | Admitting: Internal Medicine

## 2021-01-19 DIAGNOSIS — I10 Essential (primary) hypertension: Secondary | ICD-10-CM

## 2021-01-20 MED ORDER — HYDROCHLOROTHIAZIDE 12.5 MG PO TABS: ORAL_TABLET | ORAL | 3 refills | Status: DC

## 2021-01-26 NOTE — Telephone Encounter (Signed)
Patient sent my chart message to get in contact with clinic to schedule an appointment for August with Dr. Marianna Payment.

## 2021-01-28 ENCOUNTER — Ambulatory Visit (INDEPENDENT_AMBULATORY_CARE_PROVIDER_SITE_OTHER): Payer: Self-pay | Admitting: *Deleted

## 2021-01-28 DIAGNOSIS — J309 Allergic rhinitis, unspecified: Secondary | ICD-10-CM

## 2021-02-02 ENCOUNTER — Encounter: Payer: Self-pay | Admitting: *Deleted

## 2021-02-09 DIAGNOSIS — J3089 Other allergic rhinitis: Secondary | ICD-10-CM

## 2021-02-09 NOTE — Progress Notes (Signed)
VIALS MADE. EXP 02-09-22

## 2021-02-12 ENCOUNTER — Other Ambulatory Visit: Payer: Self-pay | Admitting: Allergy and Immunology

## 2021-02-15 ENCOUNTER — Other Ambulatory Visit: Payer: Self-pay

## 2021-02-23 ENCOUNTER — Ambulatory Visit (INDEPENDENT_AMBULATORY_CARE_PROVIDER_SITE_OTHER): Payer: Self-pay | Admitting: *Deleted

## 2021-02-23 DIAGNOSIS — J309 Allergic rhinitis, unspecified: Secondary | ICD-10-CM

## 2021-03-31 ENCOUNTER — Ambulatory Visit (INDEPENDENT_AMBULATORY_CARE_PROVIDER_SITE_OTHER): Payer: Self-pay

## 2021-03-31 DIAGNOSIS — J309 Allergic rhinitis, unspecified: Secondary | ICD-10-CM

## 2021-05-04 ENCOUNTER — Ambulatory Visit (INDEPENDENT_AMBULATORY_CARE_PROVIDER_SITE_OTHER): Payer: Self-pay | Admitting: *Deleted

## 2021-05-04 DIAGNOSIS — J309 Allergic rhinitis, unspecified: Secondary | ICD-10-CM

## 2021-05-13 ENCOUNTER — Ambulatory Visit (INDEPENDENT_AMBULATORY_CARE_PROVIDER_SITE_OTHER): Payer: Self-pay | Admitting: *Deleted

## 2021-05-13 DIAGNOSIS — J309 Allergic rhinitis, unspecified: Secondary | ICD-10-CM

## 2021-05-20 ENCOUNTER — Ambulatory Visit (INDEPENDENT_AMBULATORY_CARE_PROVIDER_SITE_OTHER): Payer: Self-pay | Admitting: *Deleted

## 2021-05-20 DIAGNOSIS — J309 Allergic rhinitis, unspecified: Secondary | ICD-10-CM

## 2021-05-26 ENCOUNTER — Ambulatory Visit (INDEPENDENT_AMBULATORY_CARE_PROVIDER_SITE_OTHER): Payer: Self-pay

## 2021-05-26 DIAGNOSIS — J309 Allergic rhinitis, unspecified: Secondary | ICD-10-CM

## 2021-06-03 ENCOUNTER — Ambulatory Visit (INDEPENDENT_AMBULATORY_CARE_PROVIDER_SITE_OTHER): Payer: Self-pay | Admitting: *Deleted

## 2021-06-03 DIAGNOSIS — J309 Allergic rhinitis, unspecified: Secondary | ICD-10-CM

## 2021-07-01 ENCOUNTER — Ambulatory Visit (INDEPENDENT_AMBULATORY_CARE_PROVIDER_SITE_OTHER): Payer: Self-pay | Admitting: *Deleted

## 2021-07-01 DIAGNOSIS — J309 Allergic rhinitis, unspecified: Secondary | ICD-10-CM

## 2021-08-05 ENCOUNTER — Ambulatory Visit (INDEPENDENT_AMBULATORY_CARE_PROVIDER_SITE_OTHER): Payer: Self-pay

## 2021-08-05 DIAGNOSIS — J309 Allergic rhinitis, unspecified: Secondary | ICD-10-CM

## 2021-09-07 ENCOUNTER — Ambulatory Visit (INDEPENDENT_AMBULATORY_CARE_PROVIDER_SITE_OTHER): Payer: Self-pay | Admitting: *Deleted

## 2021-09-07 DIAGNOSIS — J309 Allergic rhinitis, unspecified: Secondary | ICD-10-CM

## 2021-10-12 ENCOUNTER — Ambulatory Visit (INDEPENDENT_AMBULATORY_CARE_PROVIDER_SITE_OTHER): Payer: Self-pay | Admitting: *Deleted

## 2021-10-12 DIAGNOSIS — J309 Allergic rhinitis, unspecified: Secondary | ICD-10-CM

## 2021-10-25 DIAGNOSIS — J3081 Allergic rhinitis due to animal (cat) (dog) hair and dander: Secondary | ICD-10-CM

## 2021-10-25 NOTE — Progress Notes (Signed)
VIALS EXP 10-26-22 ?

## 2021-11-02 ENCOUNTER — Other Ambulatory Visit: Payer: Self-pay

## 2021-11-05 ENCOUNTER — Encounter: Payer: Self-pay | Admitting: Internal Medicine

## 2021-11-09 MED ORDER — PRAVASTATIN SODIUM 20 MG PO TABS: ORAL_TABLET | ORAL | 2 refills | Status: DC

## 2021-11-16 ENCOUNTER — Ambulatory Visit (INDEPENDENT_AMBULATORY_CARE_PROVIDER_SITE_OTHER): Payer: Self-pay

## 2021-11-16 DIAGNOSIS — J309 Allergic rhinitis, unspecified: Secondary | ICD-10-CM

## 2021-12-21 ENCOUNTER — Ambulatory Visit (INDEPENDENT_AMBULATORY_CARE_PROVIDER_SITE_OTHER): Payer: Self-pay

## 2021-12-21 DIAGNOSIS — J309 Allergic rhinitis, unspecified: Secondary | ICD-10-CM

## 2022-01-21 ENCOUNTER — Ambulatory Visit (INDEPENDENT_AMBULATORY_CARE_PROVIDER_SITE_OTHER): Payer: Self-pay | Admitting: *Deleted

## 2022-01-21 DIAGNOSIS — J309 Allergic rhinitis, unspecified: Secondary | ICD-10-CM

## 2022-01-22 ENCOUNTER — Encounter: Payer: Self-pay | Admitting: *Deleted

## 2022-01-25 ENCOUNTER — Ambulatory Visit (INDEPENDENT_AMBULATORY_CARE_PROVIDER_SITE_OTHER): Payer: Self-pay

## 2022-01-25 DIAGNOSIS — J309 Allergic rhinitis, unspecified: Secondary | ICD-10-CM

## 2022-02-04 ENCOUNTER — Ambulatory Visit (INDEPENDENT_AMBULATORY_CARE_PROVIDER_SITE_OTHER): Payer: Self-pay

## 2022-02-04 DIAGNOSIS — J309 Allergic rhinitis, unspecified: Secondary | ICD-10-CM

## 2022-02-10 ENCOUNTER — Ambulatory Visit (INDEPENDENT_AMBULATORY_CARE_PROVIDER_SITE_OTHER): Payer: Self-pay

## 2022-02-10 DIAGNOSIS — J309 Allergic rhinitis, unspecified: Secondary | ICD-10-CM

## 2022-02-15 ENCOUNTER — Ambulatory Visit: Payer: Self-pay

## 2022-02-15 ENCOUNTER — Ambulatory Visit (INDEPENDENT_AMBULATORY_CARE_PROVIDER_SITE_OTHER): Payer: Self-pay

## 2022-02-15 VITALS — BP 119/73 | HR 78 | Temp 98.1°F | Ht 62.0 in | Wt 129.0 lb

## 2022-02-15 DIAGNOSIS — J309 Allergic rhinitis, unspecified: Secondary | ICD-10-CM

## 2022-02-15 DIAGNOSIS — Z0000 Encounter for general adult medical examination without abnormal findings: Secondary | ICD-10-CM

## 2022-02-15 DIAGNOSIS — J4530 Mild persistent asthma, uncomplicated: Secondary | ICD-10-CM

## 2022-02-15 DIAGNOSIS — F422 Mixed obsessional thoughts and acts: Secondary | ICD-10-CM

## 2022-02-15 DIAGNOSIS — K51919 Ulcerative colitis, unspecified with unspecified complications: Secondary | ICD-10-CM

## 2022-02-15 DIAGNOSIS — I10 Essential (primary) hypertension: Secondary | ICD-10-CM

## 2022-02-15 DIAGNOSIS — R5383 Other fatigue: Secondary | ICD-10-CM

## 2022-02-15 MED ORDER — HYDROCHLOROTHIAZIDE 12.5 MG PO TABS: ORAL_TABLET | ORAL | 3 refills | Status: DC

## 2022-02-15 MED ORDER — PAROXETINE HCL 30 MG PO TABS: 60.0000 mg | ORAL_TABLET | Freq: Every day | ORAL | 3 refills | Status: DC

## 2022-02-15 NOTE — Assessment & Plan Note (Signed)
Please see A/P for ulcerative colitis.

## 2022-02-15 NOTE — Assessment & Plan Note (Signed)
Mammogram-normal 2022 Pap smear- Due and will refer to well women program Dexa- Due for this given age >53 and ulcerative colitis (will hold off until patient applies for orange card) Colonoscopy- defer until patient can apply for orange card

## 2022-02-15 NOTE — Progress Notes (Deleted)
HTN -HCTZ 12.5 mg daily -CMP  Mild persistent asthma- FEV/FVC 0.74 12/15/2020. -beclomethasone 80 mcg 1 puff daily -Prn albuterol  Ulcerative colitis-Follows Dr. Collene Mares. Should have repeat colonoscopy q5 years (2019?) but last seen 2014. -mesalamine -CBC, CRP/ fecal calprotectin, B12/folate  GERD -protonix 40 mg  HLD -Pravastatin 20 mg  HCM - pap smear -mammogram -DXA age 66  Anxiety  OCD -Paroxetine 60 mg daily

## 2022-02-15 NOTE — Progress Notes (Signed)
Established Patient Office Visit  Subjective   Patient ID: Christina Burton, female    DOB: September 05, 1955  Age: 66 y.o. MRN: [id number]  Chief Complaint  Patient presents with   Follow-up    Christina Burton is a 66 y/o female with a pmh outlined below here for follow up See A/P for HPI.      Review of Systems  Constitutional:  Negative for chills, fever and weight loss.  Respiratory:  Positive for cough. Negative for sputum production, shortness of breath and wheezing.   Cardiovascular:  Negative for chest pain and PND.  Gastrointestinal:  Negative for abdominal pain, blood in stool, diarrhea, nausea and vomiting.      Objective:     BP 119/73 (BP Location: Right Arm, Patient Position: Sitting, Cuff Size: Small)   Pulse 78   Temp 98.1 F (36.7 C) (Oral)   Ht 5' 2"  (1.575 m)   Wt 129 lb (58.5 kg)   LMP 03/01/2010   SpO2 96%   BMI 23.59 kg/m    Physical Exam Constitutional:      General: She is not in acute distress.    Appearance: Normal appearance. She is normal weight.  Eyes:     Conjunctiva/sclera: Conjunctivae normal.  Cardiovascular:     Rate and Rhythm: Normal rate and regular rhythm.     Pulses: Normal pulses.     Heart sounds: Normal heart sounds. No murmur heard. Pulmonary:     Effort: Pulmonary effort is normal. No respiratory distress.     Breath sounds: Normal breath sounds. No wheezing or rales.  Abdominal:     General: Abdomen is flat. Bowel sounds are normal.     Palpations: Abdomen is soft. There is no mass.     Tenderness: There is no guarding or rebound.     Comments: Mild ptp LUQ  Musculoskeletal:     Right lower leg: No edema.     Left lower leg: No edema.  Skin:    General: Skin is warm and dry.     Capillary Refill: Capillary refill takes less than 2 seconds.  Neurological:     Mental Status: She is alert.      No results found for any visits on 02/15/22.    The 10-year ASCVD risk score (Arnett DK, et al., 2019) is: 4.6%     Assessment & Plan:   Problem List Items Addressed This Visit       Cardiovascular and Mediastinum   Hypertension (Chronic)    BP was well controlled at 119/73 today. -Continue HCTZ -Will need annual repeat of BMP at next visit if affordable      Relevant Medications   hydrochlorothiazide (HYDRODIURIL) 12.5 MG tablet     Respiratory   Mild persistent asthma    Patient reports use of QVAR 3-4 times weekly and denies significant CP,/tightness, SOB. She has a cough that improves some with her inhaler but feels this may also be due to chronic allergies and GERD. She describes 1-2 night time awakenings over the past few months and no use of her albuterol. Her lungs are clear on exam with good air movement. She has symptoms consistent with intermittent asthma. -beclomethasone 80 mcg 1 puff daily -Prn albuterol        Digestive   Ulcerative colitis ([place]) - Primary    Patient denies weight loss, abdominal pain, bloody stools and says she has well formed stools. She is feeling fatigued. She has not seen Christina Burton, her  GI doctor, in a while. She was supposed to have a colonoscopy in 2019 but could not afford it without insurance. Will get CBC to look for bleeding with UC. Also will look for micro/macrocytosis as clues to iron deficiency or B12/folate deficiency. -CBC -Can plan for vitamin D, TSH, B12 levels if patient gets orange card      Relevant Orders   CBC no Diff     Other   Obsessive-compulsive disorder   Relevant Medications   PARoxetine (PAXIL) 30 MG tablet   Healthcare maintenance    Mammogram-normal 2022 Pap smear- Due and will refer to well women program Dexa- Due for this given age >49 and ulcerative colitis (will hold off until patient applies for orange card) Colonoscopy- defer until patient can apply for orange card      Fatigue    Please see A/P for ulcerative colitis.      Relevant Orders   CBC no Diff   Other Visit Diagnoses     Essential hypertension   (Chronic)      Relevant Medications   hydrochlorothiazide (HYDRODIURIL) 12.5 MG tablet       No follow-ups on file.    Iona Coach, MD

## 2022-02-15 NOTE — Assessment & Plan Note (Addendum)
Patient denies weight loss, abdominal pain, bloody stools and says she has well formed stools. She is feeling fatigued. She has not seen Dr. Collene Mares, her GI doctor, in a while. She was supposed to have a colonoscopy in 2019 but could not afford it without insurance. Will get CBC to look for bleeding with UC. Also will look for micro/macrocytosis as clues to iron deficiency or B12/folate deficiency. -CBC -Can plan for vitamin D, TSH, B12 levels if patient gets orange card

## 2022-02-15 NOTE — Patient Instructions (Signed)
Thank you, Christina Burton for allowing [place] to provide your care today. Today we discussed :  Fatigue- There are many causes of fatigue including uncontrolled asthma, anemia from blood loss or iron deficiency, vitamin B12 deficiency, vitamin D deficiency, low thyroid hormone. Since you have ulcerative colitis which puts you at risk for microscopic bleeding, we will check blood counts today. The size of your blood cells can also help [place] tell if you might have iron deficiency or B12 deficiency. We will defer other labs until you get an orange card application decision.  Health care maintenance- You are due for a pap smear and will be referred to well women. If you get an orange card we can work on getting a colonoscopy and dexa bone scan at that time.  Lab Orders         CBC no Diff        Referrals ordered today:   Referral Orders  No referral(s) requested today     I have ordered the following medication/changed the following medications:   Stop the following medications: Medications Discontinued During This Encounter  Medication Reason   PARoxetine (PAXIL) 30 MG tablet Reorder   hydrochlorothiazide (HYDRODIURIL) 12.5 MG tablet Reorder     Start the following medications: Meds ordered this encounter  Medications   PARoxetine (PAXIL) 30 MG tablet    Sig: Take 2 tablets (60 mg total) by mouth daily.    Dispense:  180 tablet    Refill:  3   hydrochlorothiazide (HYDRODIURIL) 12.5 MG tablet    Sig: TAKE 1 TABLET(12.5 MG) BY MOUTH DAILY    Dispense:  90 tablet    Refill:  3     Follow up: 4-6 months     We look forward to seeing you next time. Please call our clinic at [phone] if you have any questions or concerns. The best time to call is Monday-Friday from 9am-4pm, but there is someone available 24/7. If after hours or the weekend, call the main hospital number and ask for the Internal Medicine Resident On-Call. If you need medication refills, please notify your  pharmacy one week in advance and they will send [place] a request.   Thank you for trusting me with your care. Wishing you the best!   Iona Coach, MD [place]

## 2022-02-15 NOTE — Assessment & Plan Note (Signed)
Patient reports use of QVAR 3-4 times weekly and denies significant CP,/tightness, SOB. She has a cough that improves some with her inhaler but feels this may also be due to chronic allergies and GERD. She describes 1-2 night time awakenings over the past few months and no use of her albuterol. Her lungs are clear on exam with good air movement. She has symptoms consistent with intermittent asthma. -beclomethasone 80 mcg 1 puff daily -Prn albuterol

## 2022-02-15 NOTE — Assessment & Plan Note (Signed)
BP was well controlled at 119/73 today. -Continue HCTZ -Will need annual repeat of BMP at next visit if affordable

## 2022-02-16 ENCOUNTER — Telehealth: Payer: Self-pay

## 2022-02-16 LAB — CBC
Hematocrit: 42.6 % (ref 34.0–46.6)
Hemoglobin: 14.5 g/dL (ref 11.1–15.9)
MCH: 28.6 pg (ref 26.6–33.0)
MCHC: 34 g/dL (ref 31.5–35.7)
MCV: 84 fL (ref 79–97)
Platelets: 276 10*3/uL (ref 150–450)
RBC: 5.07 x10E6/uL (ref 3.77–5.28)
RDW: 13.9 % (ref 11.7–15.4)
WBC: 9 10*3/uL (ref 3.4–10.8)

## 2022-02-16 NOTE — Telephone Encounter (Signed)
Discussed with patient that blood counts were normal and rbc size was normal, which could be reassuring that she doesn't have iron deficiency or B12/folate deficiency. Discussed next steps of applying for orange card and contacting well women program for further labs given no insurance coverage at this time.

## 2022-02-21 NOTE — Progress Notes (Signed)
Internal Medicine Clinic Attending  I saw and evaluated the patient.  I personally confirmed the key portions of the history and exam documented by Dr. Stann Mainland and I reviewed pertinent patient test results.  The assessment, diagnosis, and plan were formulated together and I agree with the documentation in the resident's note.

## 2022-03-18 ENCOUNTER — Ambulatory Visit (INDEPENDENT_AMBULATORY_CARE_PROVIDER_SITE_OTHER): Payer: Self-pay

## 2022-03-18 DIAGNOSIS — J309 Allergic rhinitis, unspecified: Secondary | ICD-10-CM

## 2022-04-19 ENCOUNTER — Ambulatory Visit (INDEPENDENT_AMBULATORY_CARE_PROVIDER_SITE_OTHER): Payer: Self-pay

## 2022-04-19 DIAGNOSIS — J309 Allergic rhinitis, unspecified: Secondary | ICD-10-CM

## 2022-05-16 ENCOUNTER — Other Ambulatory Visit: Payer: Self-pay | Admitting: Allergy and Immunology

## 2022-05-17 ENCOUNTER — Ambulatory Visit (INDEPENDENT_AMBULATORY_CARE_PROVIDER_SITE_OTHER): Payer: Self-pay

## 2022-05-17 DIAGNOSIS — J309 Allergic rhinitis, unspecified: Secondary | ICD-10-CM

## 2022-05-22 NOTE — Patient Instructions (Incomplete)
  1. Continue Qvar 80 REDIHALER 2 inhalations 1 time per day . Make sure and take this every day and rinse your mouth out afterwards  Recommend 2 view chest x-ray due to cough. She will call to find out cost due to not having insurance. She will call us if she is interested  2. Continue Fluticasone one spray each nostril 1 time per day  3. Continue immunotherapy and EpiPen  4. Continue pantoprazole 40 mg once a day  5. STOP Cyproheptadine due to having to be up at night taking care of your mom  6. Continue nasal Astelin, nasal saline, Zyrtec, Proventil HFA, Mucinex DM, Systane and Zaditor if needed  7. Return to clinic in 4-6 weeks or earlier if problem

## 2022-05-23 ENCOUNTER — Encounter: Payer: Self-pay | Admitting: Family

## 2022-05-23 ENCOUNTER — Ambulatory Visit (INDEPENDENT_AMBULATORY_CARE_PROVIDER_SITE_OTHER): Payer: Self-pay | Admitting: Family

## 2022-05-23 VITALS — BP 110/70 | HR 80 | Temp 98.7°F | Resp 16 | Ht 63.0 in | Wt 121.2 lb

## 2022-05-23 DIAGNOSIS — G4720 Circadian rhythm sleep disorder, unspecified type: Secondary | ICD-10-CM

## 2022-05-23 DIAGNOSIS — J4530 Mild persistent asthma, uncomplicated: Secondary | ICD-10-CM

## 2022-05-23 DIAGNOSIS — K219 Gastro-esophageal reflux disease without esophagitis: Secondary | ICD-10-CM

## 2022-05-23 DIAGNOSIS — R053 Chronic cough: Secondary | ICD-10-CM

## 2022-05-23 DIAGNOSIS — J3089 Other allergic rhinitis: Secondary | ICD-10-CM

## 2022-05-23 MED ORDER — AZELASTINE HCL 0.1 % NA SOLN: NASAL | 5 refills | Status: DC

## 2022-05-23 MED ORDER — EPINEPHRINE 0.3 MG/0.3ML IJ SOAJ: INTRAMUSCULAR | 1 refills | Status: AC

## 2022-05-23 MED ORDER — QVAR REDIHALER 80 MCG/ACT IN AERB: 2.0000 | INHALATION_SPRAY | Freq: Two times a day (BID) | RESPIRATORY_TRACT | 5 refills | Status: DC

## 2022-05-23 MED ORDER — FLUTICASONE PROPIONATE 50 MCG/ACT NA SUSP: NASAL | 5 refills | Status: DC

## 2022-05-23 MED ORDER — CETIRIZINE HCL 10 MG PO TABS: 10.0000 mg | ORAL_TABLET | Freq: Every day | ORAL | 5 refills | Status: DC

## 2022-05-23 NOTE — Progress Notes (Signed)
Christina Burton [id number] Dept: [phone]  FOLLOW UP NOTE  Patient ID: Christina Burton, female    DOB: 1955-08-15  Age: 67 y.o. MRN: [id number] Date of Office Visit: 05/23/2022  Assessment  Chief Complaint: Asthma (Yearly - Same), LPRD (Yearly - Pretty Good), and Other Allergic Rhinitis (Yearly - Up/down )  HPI Christina Burton is a 66 year old female who presents today for follow-up of well-controlled mild persistent asthma, allergic rhinitis, laryngopharyngeal reflux disease, dysfunction of sleep stages or arousal.  She was last seen on Dec 15, 2020 by Dr. Neldon Mc.  She denies any new diagnosis or surgeries since her last office visit.  She does mention that she had a tick bite in September 2023.  Mild persistent asthma: She reports a cough that is not constant but occurs either in the morning or at night.  The cough is dry for the most part but sometimes she will see some yellow.  The cough has been ongoing for maybe 5 years. She is not sure if the cough is due to asthma, reflux, or her allergies. She also has a little bit of wheezing, tightness in her chest, but not that often maybe once a couple weeks.  She also has shortness of breath maybe once in a while, and she denies nocturnal awakenings due to breathing problems due to having to be up taking care of her mom.  She is currently only using her Qvar 80 mcg RediHaler 2 puffs maybe once a week.  She is not getting to use it every day.  She has used her albuterol maybe once or twice since her last appointment.  Since her last office visit she has not required any systemic steroids or made any trips to the emergency room or urgent care due to breathing problems.  Allergic rhinitis: She reports rhinorrhea and nasal congestion  once in a while, a little postnasal drip, and she also at times will have a little sinus pressure.  She currently uses fluticasone nasal spray as needed, saline spray as needed, Astelin nasal spray as needed,  Zyrtec as needed, Zaditor as needed.  She does report itchy watery eyes at times. She has not had any sinus infections since we last saw her.  Reflux is reported as doing pretty good with pantoprazole 40 mg once a day.  She tries not to eat anything late or something that will upset her.  She does not drink caffeine, but does eat chocolate whenever she can get it.  This occurs approximately 2-4 times a week.  Dysfunction of sleep stage or arousal: She reports that she is no longer taking cyproheptadine 4 mg half a tablet at night due to having to get up and take care of her mom during the middle of the night.   Drug Allergies:  Allergies  Allergen Reactions   Amoxicillin Hives   Articaine     Caused numbness that too long - lasted about 6 months when used by dentist   Atenolol Hives   Sulfonamide Derivatives     REACTION: Unknown reaction   Trimox [Amoxicillin Trihydrate]     Review of Systems: Review of Systems  Constitutional:  Negative for chills and fever.  HENT:         Reports rhinorrhea and nasal congestion once in a while.  Also reports a little bit of postnasal drip.  She also has sinus pressure at times  Eyes:        Reports itchy watery eyes  for which she has saline drops and Zaditor  Respiratory:  Positive for cough, shortness of breath and wheezing.        Reports a cough not all the time, mild wheezing, tightness in chest, but not very often, shortness of breath maybe once and awhile. She denies nocturnal awakenings due to breathing problems due to being up with her mother at night  Cardiovascular:  Negative for chest pain and palpitations.  Gastrointestinal:        Reports reflux once in awhile  Genitourinary:  Negative for frequency.  Skin:  Negative for itching and rash.  Neurological:  Positive for headaches.       Reports headaches once in awhile  Endo/Heme/Allergies:  Positive for environmental allergies.     Physical Exam: BP 110/70   Pulse 80   Temp  98.7 F (37.1 C)   Resp 16   Ht 5' 3"  (1.6 m)   Wt 121 lb 3.2 oz (55 kg)   LMP 03/01/2010   SpO2 98%   BMI 21.47 kg/m    Physical Exam Constitutional:      Appearance: Normal appearance.  HENT:     Head: Normocephalic and atraumatic.     Comments: Pharynx normal, eyes normal, ears normal, nose normal     Right Ear: Tympanic membrane, ear canal and external ear normal.     Left Ear: Tympanic membrane, ear canal and external ear normal.     Nose: Nose normal.     Mouth/Throat:     Mouth: Mucous membranes are moist.     Pharynx: Oropharynx is clear.  Eyes:     Conjunctiva/sclera: Conjunctivae normal.  Cardiovascular:     Rate and Rhythm: Normal rate and regular rhythm.     Heart sounds: Normal heart sounds.  Pulmonary:     Effort: Pulmonary effort is normal.     Breath sounds: Normal breath sounds.     Comments: Lungs clear to auscultation Musculoskeletal:     Cervical back: Neck supple.  Skin:    General: Skin is warm.  Neurological:     Mental Status: She is alert and oriented to person, place, and time.  Psychiatric:        Mood and Affect: Mood normal.        Behavior: Behavior normal.        Thought Content: Thought content normal.        Judgment: Judgment normal.     Diagnostics: FVC 2.96 L (101%) FEV1 1.97 L (86%) predicted FVC 2.93 L, predicted FEV1 2.30 L.  Spirometry indicates normal respiratory function.  Assessment and Plan: 1. Mild persistent asthma without complication   2. Other allergic rhinitis   3. Chronic cough   4. LPRD (laryngopharyngeal reflux disease)   5. Dysfunction of sleep stage or arousal     Meds ordered this encounter  Medications   beclomethasone (QVAR REDIHALER) 80 MCG/ACT inhaler    Sig: Inhale 2 puffs into the lungs 2 (two) times daily.    Dispense:  10.6 g    Refill:  5   azelastine (ASTELIN) 0.1 % nasal spray    Sig: USE ONE SPRAY IN EACH NOSTRIL TWICE DAILY IF NEEDED    Dispense:  30 mL    Refill:  5   cetirizine  (ZYRTEC) 10 MG tablet    Sig: Take 1 tablet (10 mg total) by mouth daily.    Dispense:  30 tablet    Refill:  5   EPINEPHrine 0.3 mg/0.3  mL IJ SOAJ injection    Sig: Use as directed for anaphylactic reaction    Dispense:  2 each    Refill:  1   fluticasone (FLONASE) 50 MCG/ACT nasal spray    Sig: Place 1 to 2 sprays in each nostril once a day as needed for stuffy nose    Dispense:  16 g    Refill:  5    Patient Instructions    1. Continue Qvar 80 REDIHALER 2 inhalations 1 time per day . Make sure and take this every day and rinse your mouth out afterwards  Recommend 2 view chest x-ray due to cough. She will call to find out cost due to not having insurance. She will call us if she is interested  2. Continue Fluticasone one spray each nostril 1 time per day  3. Continue immunotherapy and EpiPen  4. Continue pantoprazole 40 mg once a day  5. STOP Cyproheptadine due to having to be up at night taking care of your mom  6. Continue nasal Astelin, nasal saline, Zyrtec, Proventil HFA, Mucinex DM, Systane and Zaditor if needed  7. Return to clinic in 4-6 weeks or earlier if problem      Return in about 6 weeks (around 07/04/2022), or if symptoms worsen or fail to improve.    Thank you for the opportunity to care for this patient.  Please do not hesitate to contact me with questions.  Althea Charon, FNP [facility]

## 2022-05-24 ENCOUNTER — Telehealth: Payer: Self-pay | Admitting: Family

## 2022-05-24 MED ORDER — PANTOPRAZOLE SODIUM 40 MG PO TBEC: 40.0000 mg | DELAYED_RELEASE_TABLET | Freq: Every day | ORAL | 1 refills | Status: DC

## 2022-05-24 NOTE — Telephone Encounter (Signed)
Patient came in yesterday and needs to have epi-pen and pantoprazole. [facility] on [address] [phone]

## 2022-05-24 NOTE — Telephone Encounter (Signed)
Spoke to patient and advised her that the epipen was sent in yesterday and that the pantoprazole was just sent in to the pharmacy.

## 2022-05-26 ENCOUNTER — Telehealth: Payer: Self-pay | Admitting: Family

## 2022-05-26 NOTE — Telephone Encounter (Signed)
Patient states she does not have insurance and her epi pen & qvar inhaler are too costly. Patient would like to know if there are any coupons that could help her get these medications.   Please advise.   Best contact number: [phone]

## 2022-05-27 ENCOUNTER — Encounter: Payer: Self-pay | Admitting: *Deleted

## 2022-05-27 NOTE — Telephone Encounter (Signed)
Sent MyChart message of different options to help the patient, called and informed the patient, patient verbalized understanding. She is wanting to know if there is a generic option similar to QVAR?

## 2022-05-27 NOTE — Telephone Encounter (Signed)
Another option is [facility]. Has she looked on [facility]?

## 2022-05-30 MED ORDER — FLUTICASONE PROPIONATE HFA 110 MCG/ACT IN AERO: INHALATION_SPRAY | RESPIRATORY_TRACT | 5 refills | Status: DC

## 2022-05-30 NOTE — Addendum Note (Signed)
Addended by: Althea Charon on: 05/30/2022 09:30 AM   Modules accepted: Orders

## 2022-05-30 NOTE — Telephone Encounter (Signed)
Please advise on which dose of Flovent you would like sent in for the patient.

## 2022-05-30 NOTE — Telephone Encounter (Signed)
I sent a prescription for generic Flovent 110 mcg taking 2 puffs once a day with a spacer. Rinse mouth out afterwards to help prevent thrush. Please see if she has a spacer.

## 2022-05-30 NOTE — Telephone Encounter (Signed)
Called patient and was not able to leave a message. I will try calling again tomorrow

## 2022-05-30 NOTE — Telephone Encounter (Signed)
I do believe there is generic Flovent which would be similar to Qvar.

## 2022-06-02 ENCOUNTER — Telehealth: Payer: Self-pay | Admitting: Family

## 2022-06-02 NOTE — Telephone Encounter (Signed)
Christina Burton called in about a chest x-ray and states she is self pay.  She called around to see since she is self pay if she can afford one.  She found [facility] at [address] [facility].  She said you would need to send something to them so she can get the x-ray performed at that particular place.  Please advise.

## 2022-06-06 NOTE — Addendum Note (Signed)
Addended by: Althea Charon on: 06/06/2022 06:12 AM   Modules accepted: Orders

## 2022-06-06 NOTE — Telephone Encounter (Signed)
Spoke to Christina Burton and explained to her about the delay and we will call her when we get the results of the chest x-ray  [name] [phone]

## 2022-06-06 NOTE — Telephone Encounter (Signed)
Please let Christina Burton know that I am sorry for the delay,but I have been out of the office. I put in the order for the chest-xray. We will call her with results once they are back.

## 2022-06-16 ENCOUNTER — Ambulatory Visit (INDEPENDENT_AMBULATORY_CARE_PROVIDER_SITE_OTHER): Payer: Self-pay | Admitting: *Deleted

## 2022-06-16 DIAGNOSIS — J309 Allergic rhinitis, unspecified: Secondary | ICD-10-CM

## 2022-06-17 ENCOUNTER — Ambulatory Visit
Admission: RE | Admit: 2022-06-17 | Discharge: 2022-06-17 | Disposition: A | Discharge status: Home / Self Care | Payer: No Typology Code available for payment source | Source: Ambulatory Visit | Attending: Family | Admitting: Family

## 2022-06-20 NOTE — Progress Notes (Signed)
Please let Christina Burton know that her chest x-ray is normal. This is good news!

## 2022-06-26 NOTE — Patient Instructions (Addendum)
  1. Continue Flovent 110 mcg 2 inhalations 1 time per day . Make sure and take this every day and rinse your mouth out afterwards  2. Continue Fluticasone one spray each nostril 1 time per day  3. Continue immunotherapy and EpiPen. Discussed possibly stopping if interested  4. Continue pantoprazole 40 mg once a day  5. Continue nasal Astelin, nasal saline, Zyrtec, Proventil HFA, Mucinex DM, Systane and Zaditor if needed  6. Return to clinic in 4-6 months  or earlier if problem

## 2022-06-27 ENCOUNTER — Other Ambulatory Visit: Payer: Self-pay

## 2022-06-27 ENCOUNTER — Ambulatory Visit (INDEPENDENT_AMBULATORY_CARE_PROVIDER_SITE_OTHER): Payer: Self-pay | Admitting: Family

## 2022-06-27 ENCOUNTER — Telehealth: Payer: Self-pay | Admitting: Family

## 2022-06-27 ENCOUNTER — Encounter: Payer: Self-pay | Admitting: Family

## 2022-06-27 VITALS — BP 112/70 | HR 86 | Temp 98.3°F | Resp 16 | Ht 62.8 in | Wt 117.9 lb

## 2022-06-27 DIAGNOSIS — R053 Chronic cough: Secondary | ICD-10-CM

## 2022-06-27 DIAGNOSIS — G4720 Circadian rhythm sleep disorder, unspecified type: Secondary | ICD-10-CM

## 2022-06-27 DIAGNOSIS — J4530 Mild persistent asthma, uncomplicated: Secondary | ICD-10-CM

## 2022-06-27 DIAGNOSIS — J3089 Other allergic rhinitis: Secondary | ICD-10-CM

## 2022-06-27 DIAGNOSIS — K219 Gastro-esophageal reflux disease without esophagitis: Secondary | ICD-10-CM

## 2022-06-27 NOTE — Progress Notes (Signed)
[name] [id number] Dept: [phone]  FOLLOW UP NOTE  Patient ID: Christina Burton, female    DOB: 01/10/1956  Age: 66 y.o. MRN: [id number] Date of Office Visit: 06/27/2022  Assessment  Chief Complaint: Follow-up (Little bit of a cough. No medication refills needed at this time. )  HPI Christina Burton is a 66 year old female who presents today for follow-up of mild persistent asthma, allergic rhinitis, chronic cough, laryngopharyngeal reflux disease, and dysfunction of sleep stage or arousal.  She was last seen on May 23, 2022 by myself.  She denies any new diagnosis or surgery since her last office visit.  Mild persistent asthma: She reports that her cough is not as much and not as bad since starting the Flovent 110 mcg 2 puffs once a day without a spacer.  She just recently found a spacer and will start using it.  She denies tightness in chest wheezing, shortness of breath, and nocturnal awakenings due to breathing problems.  Since her last office visit she has not required any systemic steroids or made any trips to the emergency room or urgent care due to breathing problems.  She has not had to use her albuterol inhaler since we last saw her.  She did have a chest x-ray June 17, 2022 showing "no acute cardiopulmonary disease."  Allergic rhinitis: She reports a little bit of postnasal drip, rhinorrhea, nasal congestion.  She uses her Flonase nasal spray and this helps.  She has not started using azelastine nasal spray.  She has not had any sinus infections since we last saw her.  Laryngopharyngeal reflux disease is reported as not too bad with pantoprazole 40 mg once a day.  Dysfunction of sleep stage or arousal: She is still not taking cyproheptadine due to having to be up at night with her mom.  She did mention that when she was taking cyproheptadine that it did help her sleep but it made her feel groggy.   Drug Allergies:  Allergies  Allergen Reactions    Amoxicillin Hives   Articaine     Caused numbness that too long - lasted about 6 months when used by dentist   Atenolol Hives   Sulfonamide Derivatives     REACTION: Unknown reaction   Trimox [Amoxicillin Trihydrate]     Review of Systems: Review of Systems  Constitutional:  Negative for chills and fever.  HENT:         Reports occasional rhinorrhea, nasal congestion, and postnasal drip  Eyes:        Reports itchy watery eyes for which she has Zaditor and Systane eyedrops  Respiratory:  Positive for cough. Negative for shortness of breath and wheezing.        Reports a little cough that is not as much and not as bad.  Denies wheezing, tightness in chest, shortness of breath, and nocturnal awakenings due to breathing problems  Cardiovascular:  Negative for chest pain and palpitations.  Gastrointestinal:        Reports reflux is not too bad with pantoprazole 40 mg once a day  Skin:  Negative for itching and rash.  Neurological:  Negative for headaches.  Endo/Heme/Allergies:  Positive for environmental allergies.     Physical Exam: BP 112/70   Pulse 86   Temp 98.3 F (36.8 C)   Resp 16   Ht 5' 2.8" (1.595 m)   Wt 117 lb 14.4 oz (53.5 kg)   LMP 03/01/2010   SpO2 95%  BMI 21.02 kg/m    Physical Exam Constitutional:      Appearance: Normal appearance.  HENT:     Head: Normocephalic and atraumatic.     Comments: Pharynx normal, eyes normal, ears normal, nose normal    Right Ear: Tympanic membrane, ear canal and external ear normal.     Left Ear: Tympanic membrane, ear canal and external ear normal.     Nose: Nose normal.     Mouth/Throat:     Mouth: Mucous membranes are moist.     Pharynx: Oropharynx is clear.  Eyes:     Conjunctiva/sclera: Conjunctivae normal.  Cardiovascular:     Rate and Rhythm: Normal rate and regular rhythm.     Heart sounds: Normal heart sounds.  Pulmonary:     Effort: Pulmonary effort is normal.     Breath sounds: Normal breath sounds.      Comments: Lungs clear to auscultation Musculoskeletal:     Cervical back: Neck supple.  Skin:    General: Skin is warm.  Neurological:     Mental Status: She is alert and oriented to person, place, and time.  Psychiatric:        Mood and Affect: Mood normal.        Behavior: Behavior normal.        Thought Content: Thought content normal.        Judgment: Judgment normal.     Diagnostics:  FVC 2.94 L ( 100%), FEV1 2.02 L ( 88%). Predicted FVC 2.93 L, predicted FEV1 2.30 L. Spirometry indicates normal respiratory function  Assessment and Plan: 1. Mild persistent asthma without complication   2. Other allergic rhinitis   3. Chronic cough   4. LPRD (laryngopharyngeal reflux disease)   5. Dysfunction of sleep stage or arousal     No orders of the defined types were placed in this encounter.   Patient Instructions    1. Continue Flovent 110 mcg 2 inhalations 1 time per day . Make sure and take this every day and rinse your mouth out afterwards  2. Continue Fluticasone one spray each nostril 1 time per day  3. Continue immunotherapy and EpiPen. Discussed possibly stopping if interested  4. Continue pantoprazole 40 mg once a day  5. Continue nasal Astelin, nasal saline, Zyrtec, Proventil HFA, Mucinex DM, Systane and Zaditor if needed  6. Return to clinic in 4-6 months  or earlier if problem     Return in about 6 months (around 12/26/2022), or if symptoms worsen or fail to improve.    Thank you for the opportunity to care for this patient.  Please do not hesitate to contact me with questions.  Althea Charon, FNP [facility]

## 2022-06-27 NOTE — Telephone Encounter (Signed)
Discussed stopping allergy injections. She reports that she has been on allergy injections since 1997 or 1998. She would like Dr. Bruna Potter thoughts on whether she should stop injections.  Althea Charon, FNP

## 2022-06-28 NOTE — Addendum Note (Signed)
Addended by: Eloy End D on: 06/28/2022 06:15 PM   Modules accepted: Orders

## 2022-07-06 DIAGNOSIS — J3089 Other allergic rhinitis: Secondary | ICD-10-CM | POA: Diagnosis not present

## 2022-07-06 NOTE — Progress Notes (Signed)
VIALS EXP 07-07-23

## 2022-07-14 ENCOUNTER — Ambulatory Visit (INDEPENDENT_AMBULATORY_CARE_PROVIDER_SITE_OTHER): Payer: Medicaid Other

## 2022-07-14 DIAGNOSIS — J309 Allergic rhinitis, unspecified: Secondary | ICD-10-CM

## 2022-07-18 NOTE — Telephone Encounter (Signed)
Spoke to patient and informed her of Dr. Neldon Mc message, however there are a set of vials just made for her on July 06, 2022. I informed her we will put Do Not Order on her new set of vials and then she can stop her injections after those vials.  Christina Burton [phone]

## 2022-08-10 ENCOUNTER — Ambulatory Visit: Payer: Medicaid Other | Admitting: Internal Medicine

## 2022-08-10 ENCOUNTER — Encounter: Payer: Self-pay | Admitting: Internal Medicine

## 2022-08-10 VITALS — BP 125/87 | HR 78 | Temp 98.1°F | Wt 116.0 lb

## 2022-08-10 DIAGNOSIS — I10 Essential (primary) hypertension: Secondary | ICD-10-CM

## 2022-08-10 DIAGNOSIS — J4530 Mild persistent asthma, uncomplicated: Secondary | ICD-10-CM

## 2022-08-10 DIAGNOSIS — M5431 Sciatica, right side: Secondary | ICD-10-CM | POA: Diagnosis present

## 2022-08-10 DIAGNOSIS — M5432 Sciatica, left side: Secondary | ICD-10-CM

## 2022-08-10 DIAGNOSIS — Z8639 Personal history of other endocrine, nutritional and metabolic disease: Secondary | ICD-10-CM

## 2022-08-10 DIAGNOSIS — Z23 Encounter for immunization: Secondary | ICD-10-CM

## 2022-08-10 DIAGNOSIS — K219 Gastro-esophageal reflux disease without esophagitis: Secondary | ICD-10-CM | POA: Diagnosis not present

## 2022-08-10 DIAGNOSIS — Z0000 Encounter for general adult medical examination without abnormal findings: Secondary | ICD-10-CM

## 2022-08-10 NOTE — Progress Notes (Signed)
CC: referral to ortho  HPI:  [name] is a 67 y.o. with medical history of HTN, Asthma, GERD, GAD, OA presenting to [facility] for a check up. PCP is Dr. Stann Mainland and last seen by him 01/2022. Instructed to return in 6 months.   Please see problem-based list for further details, assessments, and plans.  Past Medical History:  Diagnosis Date   Abnormal gait    Allergic rhinitis    Anxiety disorder    Asthma    Asthma    Falls 05/10/2018   Foot pain, bilateral    Gait disorder 06/29/2020   GERD (gastroesophageal reflux disease)    High cholesterol    Hypertension 04/24/2007   Qualifier: Diagnosis of  Problem Stop Reason:  By: Jerilee Hoh MD, Estela     Mitral valve prolapse    Normal neurological exam 06/29/2020   Obsessive compulsive disorder    Osteoarthritis    Sinusitis    chronic   Ulcerative colitis    sees Dr. Collene Mares - normal colonoscopy in Nov 2011   URI (upper respiratory infection) 09/25/2018   URI (upper respiratory infection) 09/25/2018     Current Outpatient Medications (Cardiovascular):    EPINEPHrine 0.3 mg/0.3 mL IJ SOAJ injection, Use as directed for anaphylactic reaction   hydrochlorothiazide (HYDRODIURIL) 12.5 MG tablet, TAKE 1 TABLET(12.5 MG) BY MOUTH DAILY   pravastatin (PRAVACHOL) 20 MG tablet, TAKE 1 TABLET(20 MG) BY MOUTH DAILY  Current Outpatient Medications (Respiratory):    albuterol (PROAIR HFA) 108 (90 Base) MCG/ACT inhaler, Inhale 2 puffs into the lungs every 4 (four) hours as needed for wheezing or shortness of breath.   azelastine (ASTELIN) 0.1 % nasal spray, USE ONE SPRAY IN EACH NOSTRIL TWICE DAILY IF NEEDED   budesonide-formoterol (SYMBICORT) 80-4.5 MCG/ACT inhaler, Inhale 2 puffs into the lungs 2 (two) times daily.   cetirizine (ZYRTEC) 10 MG tablet, Take 1 tablet (10 mg total) by mouth daily.   fluticasone (FLONASE) 50 MCG/ACT nasal spray, Place 1 to 2 sprays in each nostril once a day as needed for stuffy nose  Current Outpatient  Medications (Analgesics):    acetaminophen (TYLENOL) 500 MG tablet, Take 500 mg by mouth every 6 (six) hours as needed. For pain   Current Outpatient Medications (Other):    Ascorbic Acid (VITAMIN C PO), Take 1 tablet by mouth daily.   CALCIUM PO, Take by mouth daily.   Cholecalciferol (VITAMIN D PO), Take by mouth daily.   mesalamine (CANASA) 1000 MG suppository, Place 1,000 mg rectally daily as needed. Use as directed by your gastroenterologist for ulcerative colitis   mesalamine (LIALDA) 1.2 G EC tablet, Take 2.4 g by mouth daily.   Multiple Vitamin (MULTIVITAMIN WITH MINERALS) TABS, Take 1 tablet by mouth daily.   pantoprazole (PROTONIX) 40 MG tablet, Take 1 tablet (40 mg total) by mouth daily.   PARoxetine (PAXIL) 30 MG tablet, Take 2 tablets (60 mg total) by mouth daily.  Review of Systems:  Review of system negative unless stated in the problem list or HPI.    Physical Exam:  Vitals:   08/10/22 1459 08/10/22 1510  BP: (!) 134/94 125/87  Pulse: 89 78  Temp:  98.1 F (36.7 C)  TempSrc:  Oral  SpO2: 98%   Weight: 116 lb (52.6 kg)     Physical Exam General: NAD HENT: NCAT Lungs: CTAB, no wheeze, rhonchi or rales.  Cardiovascular: Normal heart sounds, no r/m/g, 2+ pulses in all extremities. No LE edema Abdomen: No TTP, normal  bowel sounds MSK: No asymmetry or muscle atrophy. Pain with passive and active ROM of right shoulder.   Skin: no lesions noted on exposed skin Neuro: Alert and oriented x4. CN grossly intact Psych: Normal mood and normal affect   Assessment & Plan:   Hypertension Pt has HTN and is on HCTZ 12.5 mg qd. Creatine 0.48 3 years ago. Rarely check BP. BP in the clinic initially 134/94 but repeat improved to 125/87. Will continue current regimen. Repeat BMP this visit shows normal renal fxn.  Healthcare maintenance Flu shot given this visit. Patient states she had her mammogram in Nov. 2023. Will need records for the mammogram.   GERD (gastroesophageal  reflux disease) Pt's GERD well controlled on Protonix 40 mg qd. Plan to continue this regimen. If stable in the future, can switch to H2 blocker.   Mild persistent asthma Pt has asthma that is well controlled on flovent and albuterol prn. ACT score is 22 showing good control. Given flovent will be discontinued, will change her to Symbicort. Pt was previously on QVAR but that was last refilled in 2021.  -Change Flovent to Symbicort. Flovent last filled 08/2022.  -Continue albuterol prn  Sciatica Right sciatic nerve pain; severe DDD at L3-L4 on plain film. L5-S1 disc buldge with foraminal narrowing. Was getting back injections that were helpful. States has not seen orthopedic when she was uninsured but would like to start seeing them again.  -Referral sent to orthopedic given pt previously established for chronic back pain. Pt also has hx of rotator cuff problems and was seen by orthopedics for this as well.    See Encounters Tab for problem based charting.  Patient discussed with Dr. Dollene Cleveland, MD Tillie Rung. [facility] Internal Medicine Residency, PGY-2

## 2022-08-10 NOTE — Patient Instructions (Addendum)
[name], it was a pleasure seeing you today! You endorsed feeling well today. Below are some of the things we talked about this visit. We look forward to seeing you in the follow up appointment!  Today we discussed: We will do some lab work today.  We will send a referral to for eye doctor and orthopedics as well.  We will give you a flu shot today.   I have ordered the following labs today:  Lab Orders         Vitamin B12         Vitamin D (25 hydroxy)         TSH        Referrals ordered today:   Referral Orders         AMB referral to orthopedics         Ambulatory referral to Ophthalmology      I have ordered the following medication/changed the following medications:   Stop the following medications: Medications Discontinued During This Encounter  Medication Reason   Melatonin 10 MG TABS Patient has not taken in last 30 days     Start the following medications: No orders of the defined types were placed in this encounter.    Follow-up: 3 month follow up  Please make sure to arrive 15 minutes prior to your next appointment. If you arrive late, you may be asked to reschedule.   We look forward to seeing you next time. Please call our clinic at [phone] if you have any questions or concerns. The best time to call is Monday-Friday from 9am-4pm, but there is someone available 24/7. If after hours or the weekend, call the main hospital number and ask for the Internal Medicine Resident On-Call. If you need medication refills, please notify your pharmacy one week in advance and they will send [place] a request.  Thank you for letting us take part in your care. Wishing you the best!  Thank you, Idamae Schuller, MD

## 2022-08-11 LAB — VITAMIN B12: Vitamin B-12: 1266 pg/mL — ABNORMAL HIGH (ref 232–1245)

## 2022-08-11 LAB — TSH: TSH: 1.61 u[IU]/mL (ref 0.450–4.500)

## 2022-08-11 LAB — VITAMIN D 25 HYDROXY (VIT D DEFICIENCY, FRACTURES): Vit D, 25-Hydroxy: 40.4 ng/mL (ref 30.0–100.0)

## 2022-08-12 MED ORDER — BUDESONIDE-FORMOTEROL FUMARATE 80-4.5 MCG/ACT IN AERO: 2.0000 | INHALATION_SPRAY | Freq: Two times a day (BID) | RESPIRATORY_TRACT | 12 refills | Status: DC

## 2022-08-12 NOTE — Assessment & Plan Note (Signed)
Pt has HTN and is on HCTZ 12.5 mg qd. Creatine 0.48 3 years ago. Rarely check BP. BP in the clinic initially 134/94 but repeat improved to 125/87. Will continue current regimen. Repeat BMP this visit shows normal renal fxn.

## 2022-08-12 NOTE — Assessment & Plan Note (Signed)
Flu shot given this visit. Patient states she had her mammogram in Nov. 2023. Will need records for the mammogram.

## 2022-08-12 NOTE — Assessment & Plan Note (Addendum)
Right sciatic nerve pain; severe DDD at L3-L4 on plain film. L5-S1 disc buldge with foraminal narrowing. Was getting back injections that were helpful. States has not seen orthopedic when she was uninsured but would like to start seeing them again.  -Referral sent to orthopedic given pt previously established for chronic back pain. Pt also has hx of rotator cuff problems and was seen by orthopedics for this as well.

## 2022-08-12 NOTE — Assessment & Plan Note (Signed)
Pt's GERD well controlled on Protonix 40 mg qd. Plan to continue this regimen. If stable in the future, can switch to H2 blocker.

## 2022-08-12 NOTE — Assessment & Plan Note (Signed)
Pt has asthma that is well controlled on flovent and albuterol prn. ACT score is 22 showing good control. Given flovent will be discontinued, will change her to Symbicort. Pt was previously on QVAR but that was last refilled in 2021.  -Change Flovent to Symbicort. Flovent last filled 08/2022.  -Continue albuterol prn

## 2022-08-16 NOTE — Progress Notes (Signed)
Internal Medicine Clinic Attending  Case discussed with Dr. Khan  At the time of the visit.  We reviewed the resident's history and exam and pertinent patient test results.  I agree with the assessment, diagnosis, and plan of care documented in the resident's note.  

## 2022-08-18 ENCOUNTER — Ambulatory Visit (INDEPENDENT_AMBULATORY_CARE_PROVIDER_SITE_OTHER): Payer: Medicaid Other

## 2022-08-18 DIAGNOSIS — J309 Allergic rhinitis, unspecified: Secondary | ICD-10-CM

## 2022-09-01 ENCOUNTER — Encounter: Payer: Medicaid Other | Admitting: Nurse Practitioner

## 2022-09-15 ENCOUNTER — Ambulatory Visit (INDEPENDENT_AMBULATORY_CARE_PROVIDER_SITE_OTHER): Payer: Medicaid Other

## 2022-09-15 DIAGNOSIS — J309 Allergic rhinitis, unspecified: Secondary | ICD-10-CM | POA: Diagnosis not present

## 2022-09-16 ENCOUNTER — Encounter: Payer: Self-pay | Admitting: Nurse Practitioner

## 2022-09-19 ENCOUNTER — Other Ambulatory Visit (HOSPITAL_COMMUNITY)
Admission: RE | Admit: 2022-09-19 | Discharge: 2022-09-19 | Disposition: A | Discharge status: Home / Self Care | Payer: Medicaid Other | Source: Ambulatory Visit | Attending: Nurse Practitioner | Admitting: Nurse Practitioner

## 2022-09-19 ENCOUNTER — Ambulatory Visit (INDEPENDENT_AMBULATORY_CARE_PROVIDER_SITE_OTHER): Payer: Medicaid Other | Admitting: Nurse Practitioner

## 2022-09-19 ENCOUNTER — Encounter: Payer: Self-pay | Admitting: Nurse Practitioner

## 2022-09-19 VITALS — BP 92/60 | HR 93 | Ht 62.25 in | Wt 112.0 lb

## 2022-09-19 DIAGNOSIS — Z124 Encounter for screening for malignant neoplasm of cervix: Secondary | ICD-10-CM | POA: Diagnosis not present

## 2022-09-19 DIAGNOSIS — Z01419 Encounter for gynecological examination (general) (routine) without abnormal findings: Secondary | ICD-10-CM

## 2022-09-19 DIAGNOSIS — Z780 Asymptomatic menopausal state: Secondary | ICD-10-CM | POA: Diagnosis not present

## 2022-09-19 DIAGNOSIS — N9089 Other specified noninflammatory disorders of vulva and perineum: Secondary | ICD-10-CM | POA: Diagnosis not present

## 2022-09-19 LAB — WET PREP FOR TRICH, YEAST, CLUE

## 2022-09-19 NOTE — Progress Notes (Signed)
   Christina Burton March 25, 1956 [id number]   History:  67 y.o. G0 presents as new patient to establish care. She complains of intermittent fishy vulvar odor without discharge. Never been sexually active. Reports abnormal pap many years ago, negative biopsies. Postmenopausal - no HRT, no bleeding. HTN, GERD managed by, asthma managed by allergy specialist.   Gynecologic History Patient's last menstrual period was 03/01/2010.   Contraception/Family planning: post menopausal status Sexually active: Never  Health Maintenance Last Pap: 09/04/2009. Results were: Normal Last mammogram: 07/2022. Results were: Normal per patient Last colonoscopy: 06/21/2013. Results were: Normal, 10-year recall Last Dexa: Never  Past medical history, past surgical history, family history and social history were all reviewed and documented in the EPIC chart. Living with mother, caring for her.   ROS:  A ROS was performed and pertinent positives and negatives are included.  Exam:  Vitals:   09/19/22 1441  BP: 92/60  Pulse: 93  SpO2: 96%  Weight: 112 lb (50.8 kg)  Height: 5' 2.25" (1.581 m)   Body mass index is 20.32 kg/m.  General appearance:  Normal Thyroid:  Symmetrical, normal in size, without palpable masses or nodularity. Respiratory  Auscultation:  Clear without wheezing or rhonchi Cardiovascular  Auscultation:  Regular rate, without rubs, murmurs or gallops  Edema/varicosities:  Not grossly evident Abdominal  Soft,nontender, without masses, guarding or rebound.  Liver/spleen:  No organomegaly noted  Hernia:  None appreciated  Skin  Inspection:  Grossly normal Breasts: Examined lying and sitting.   Right: Without masses, retractions, nipple discharge or axillary adenopathy.   Left: Without masses, retractions, nipple discharge or axillary adenopathy. Genitourinary   Inguinal/mons:  Normal without inguinal adenopathy  External genitalia:  Normal appearing vulva with no masses, tenderness,  or lesions  BUS/Urethra/Skene's glands:  Normal  Vagina:  Normal appearing with normal color and discharge, no lesions. Atrophic changes  Cervix:  Difficult to visualize due to discomfort of exam, adolescent speculum used  Uterus:  Normal in size, shape and contour.  Midline and mobile, nontender  Adnexa/parametria:     Rt: Normal in size, without masses or tenderness.   Lt: Normal in size, without masses or tenderness.  Anus and perineum: Normal  Digital rectal exam: Deferred  Patient informed chaperone available to be present for breast and pelvic exam. Patient has requested no chaperone to be present. Patient has been advised what will be completed during breast and pelvic exam.   Wet prep negative for pathogens  Assessment/Plan:  67 y.o. G0 to establish care.   Well female exam with routine gynecological exam - Education provided on SBEs, importance of preventative screenings, current guidelines, high calcium diet, regular exercise, and multivitamin daily.  Labs with PCP.   Postmenopausal - no HRT, no bleeding  Screening for cervical cancer - Plan: Cytology - PAP( [place]). Reports abnormal pap many years ago, negative biopsies.   Vulvar odor - Plan: WET PREP FOR [place], YEAST, CLUE. Negative wet prep. Hibiclens twice weekly. Avoid scented soaps or body washes.   Screening for breast cancer - Normal mammogram history.  Continue annual screenings.  Normal breast exam today.  Screening for colon cancer - 2024 colonoscopy. Will repeat at 10-year interval per GI's recommendation.   Screening for osteoporosis - Average risk. Will plan DXA at age 67.   Return in 1 year for annual.     Tamela Gammon DNP, 3:23 PM 09/19/2022

## 2022-09-20 LAB — CYTOLOGY - PAP
Comment: NEGATIVE
Diagnosis: NEGATIVE
High risk HPV: NEGATIVE

## 2022-09-21 ENCOUNTER — Encounter: Payer: Self-pay | Admitting: Nurse Practitioner

## 2022-09-21 ENCOUNTER — Telehealth: Payer: Self-pay | Admitting: *Deleted

## 2022-09-21 NOTE — Telephone Encounter (Signed)
A user error has taken place: encounter opened in error, closed for administrative reasons.

## 2022-09-28 ENCOUNTER — Other Ambulatory Visit: Payer: Self-pay

## 2022-10-12 ENCOUNTER — Telehealth: Payer: Self-pay | Admitting: Family

## 2022-10-12 ENCOUNTER — Ambulatory Visit (INDEPENDENT_AMBULATORY_CARE_PROVIDER_SITE_OTHER): Payer: Medicaid Other

## 2022-10-12 DIAGNOSIS — J309 Allergic rhinitis, unspecified: Secondary | ICD-10-CM | POA: Diagnosis not present

## 2022-10-12 NOTE — Telephone Encounter (Signed)
Patient came in and said that she needs something called in for thrust.  [place] [address] [phone]

## 2022-10-13 LAB — HM PAP SMEAR: HPV Aptima: NEGATIVE

## 2022-10-13 NOTE — Telephone Encounter (Signed)
Please tell Christina Burton that I am sorry for the delay in answer,but I was off yesterday. If she could please schedule an office visit today. I have openings today.

## 2022-10-13 NOTE — Telephone Encounter (Signed)
Attempted to call the patients home number, the phone just ran continuously. I called the mobile number and it went straight to voicemail, called and left a voicemail asking for return call to discuss and offer an office visit with Chrissie today.

## 2022-10-14 ENCOUNTER — Encounter: Payer: Self-pay | Admitting: Family

## 2022-10-14 ENCOUNTER — Ambulatory Visit (INDEPENDENT_AMBULATORY_CARE_PROVIDER_SITE_OTHER): Payer: Medicaid Other | Admitting: Family

## 2022-10-14 ENCOUNTER — Other Ambulatory Visit: Payer: Self-pay

## 2022-10-14 VITALS — Wt 112.0 lb

## 2022-10-14 DIAGNOSIS — J4530 Mild persistent asthma, uncomplicated: Secondary | ICD-10-CM

## 2022-10-14 DIAGNOSIS — B370 Candidal stomatitis: Secondary | ICD-10-CM | POA: Diagnosis not present

## 2022-10-14 DIAGNOSIS — R053 Chronic cough: Secondary | ICD-10-CM | POA: Diagnosis not present

## 2022-10-14 DIAGNOSIS — K219 Gastro-esophageal reflux disease without esophagitis: Secondary | ICD-10-CM

## 2022-10-14 DIAGNOSIS — J3089 Other allergic rhinitis: Secondary | ICD-10-CM | POA: Diagnosis not present

## 2022-10-14 DIAGNOSIS — G4720 Circadian rhythm sleep disorder, unspecified type: Secondary | ICD-10-CM

## 2022-10-14 MED ORDER — NYSTATIN 100000 UNIT/ML MT SUSP: 5.0000 mL | Freq: Four times a day (QID) | OROMUCOSAL | 0 refills | Status: AC

## 2022-10-14 NOTE — Patient Instructions (Addendum)
  1. Continue Flovent 110 mcg 2 inhalations 1 time per day with spacer . Make sure and take this every day and rinse your mouth out afterwards. Do not use Symbicort. Let us know if you are not able to get Flovent  2. Continue Fluticasone one spray each nostril 1 time per day  3. Continue immunotherapy and EpiPen. Discussed possibly stopping if interested  4. Continue pantoprazole 40 mg once a day  5. Continue nasal Astelin, nasal saline, Zyrtec, Proventil HFA, Mucinex DM, Systane and Zaditor if needed  6.Start Nystatin swish ans spit. Take 5 mL 4 times a day for 7 days. Try to retain in mouth as long as possible before spitting out. Let us know if the thrush does not get better. If the thrush does not go away, I would like to see you in person.  7. Keep already scheduled follow up appointment on 12/27/22@ 3 PM with Dr. Neldon Mc  or earlier if problem

## 2022-10-14 NOTE — Progress Notes (Signed)
RE: Christina Burton MRN: [id number] DOB: 1956-04-20 Date of Telemedicine Visit: 10/14/2022  Referring provider: Iona Coach, MD Primary care provider: Iona Coach, MD  Chief Complaint: Nasal Congestion (Sinus issues, mouth problems [facility]/)   Telemedicine Follow Up Visit via Telephone: I connected with Christina Burton for a follow up on 10/14/22 by telephone and verified that I am speaking with the correct person using two identifiers.   I discussed the limitations, risks, security and privacy concerns of performing an evaluation and management service by telephone and the availability of in person appointments. I also discussed with the patient that there may be a patient responsible charge related to this service. The patient expressed understanding and agreed to proceed.  Patient is at home  Provider is at the office.  Visit start time: 2:08 PM Visit end time: 2:25 PM Insurance consent/check in by: Apolonio Schneiders C. Medical consent and medical assistant/nurse: Estill Batten.  History of Present Illness: She is a 67 y.o. female, who is being followed for mild persistent asthma without complication, allergic rhinitis, chronic cough, laryngopharyngeal reflux disease, and dysfunction of sleep stage or arousal. Her previous allergy office visit was on June 27, 2022 with  myself .   She reports that for the last couple of days she has noticed the beginnings of thrush on the back of her tongue.  She also has a "funky taste".  She has had thrush in the distant past.  She continues to use Flovent 110 mcg 2 puffs once a day with a spacer and she does rinse her mouth out afterwards.  She does mention that her internal medicine physician gave her Symbicort to use because they were no longer making Flovent.  Discussed that they do make generic Flovent and to let us know if she is not able to get this medication.  She reports an occasional cough and denies wheezing, tightness in her chest, shortness of  breath, and nocturnal awakenings due to breathing problems.  She does feel like her asthma is doing pretty good.  She has not made any trips to the emergency room or urgent care due to breathing problems. She has had 2 steroid injections, but not for her asthma 1 was for an injection in her spine and the other 1 for her shoulder.  Allergic rhinitis: She reports a little bit of rhinorrhea, but mentions it is not bad.  She denies nasal congestion and postnasal drip.  She does take Zyrtec daily and continues to receive allergy injections per protocol.  She denies any problems or reactions with her allergy injections.  She does plan on stopping her allergy injections this year after she finishes up the vial.  She does use Flonase nasal spray as needed and has not used azelastine nasal spray.  She has not had any sinus infections since we last saw her.  Laryngopharyngeal reflux disease is reported as doing pretty good.  She does still occasionally eat some chocolate.  She continues to take pantoprazole 40 mg once a day.  She does mention that her sleep is not the best due to having to take care of her mom.  Assessment and Plan: Christina Burton is a 67 y.o. female with: Patient Instructions    1. Continue Flovent 110 mcg 2 inhalations 1 time per day with spacer . Make sure and take this every day and rinse your mouth out afterwards. Do not use Symbicort. Let us know if you are not able to get Flovent  2. Continue Fluticasone one  spray each nostril 1 time per day  3. Continue immunotherapy and EpiPen. Discussed possibly stopping if interested  4. Continue pantoprazole 40 mg once a day  5. Continue nasal Astelin, nasal saline, Zyrtec, Proventil HFA, Mucinex DM, Systane and Zaditor if needed  6.Start Nystatin swish ans spit. Take 5 mL 4 times a day for 7 days. Try to retain in mouth as long as possible before spitting out. Let us know if the thrush does not get better. If the thrush does not go away, I would  like to see you in person.  7. Keep already scheduled follow up appointment on 12/27/22@ 3 PM with Dr. Neldon Mc  or earlier if problem    Return in about 2 months (around 12/27/2022), or if symptoms worsen or fail to improve.  Meds ordered this encounter  Medications   nystatin (MYCOSTATIN) 100000 UNIT/ML suspension    Sig: Take 5 mLs (500,000 Units total) by mouth 4 (four) times daily for 7 days. Retain in mouth as long as possible before spitting out    Dispense:  140 mL    Refill:  0   Lab Orders  No laboratory test(s) ordered today    Diagnostics: None.  Medication List:  Current Outpatient Medications  Medication Sig Dispense Refill   acetaminophen (TYLENOL) 500 MG tablet Take 500 mg by mouth every 6 (six) hours as needed. For pain     albuterol (PROAIR HFA) 108 (90 Base) MCG/ACT inhaler Inhale 2 puffs into the lungs every 4 (four) hours as needed for wheezing or shortness of breath. 18 g 1   Ascorbic Acid (VITAMIN C PO) Take 1 tablet by mouth daily.     azelastine (ASTELIN) 0.1 % nasal spray USE ONE SPRAY IN EACH NOSTRIL TWICE DAILY IF NEEDED 30 mL 5   budesonide-formoterol (SYMBICORT) 80-4.5 MCG/ACT inhaler Inhale 2 puffs into the lungs 2 (two) times daily. 1 each 12   CALCIUM PO Take by mouth daily.     cetirizine (ZYRTEC) 10 MG tablet Take 1 tablet (10 mg total) by mouth daily. 30 tablet 5   Cholecalciferol (VITAMIN D PO) Take by mouth daily.     EPINEPHrine 0.3 mg/0.3 mL IJ SOAJ injection Use as directed for anaphylactic reaction 2 each 1   fluticasone (FLONASE) 50 MCG/ACT nasal spray Place 1 to 2 sprays in each nostril once a day as needed for stuffy nose 16 g 5   hydrochlorothiazide (HYDRODIURIL) 12.5 MG tablet TAKE 1 TABLET(12.5 MG) BY MOUTH DAILY 90 tablet 3   mesalamine (CANASA) 1000 MG suppository Place 1,000 mg rectally daily as needed. Use as directed by your gastroenterologist for ulcerative colitis     mesalamine (LIALDA) 1.2 G EC tablet Take 2.4 g by mouth daily.      Multiple Vitamin (MULTIVITAMIN WITH MINERALS) TABS Take 1 tablet by mouth daily.     nystatin (MYCOSTATIN) 100000 UNIT/ML suspension Take 5 mLs (500,000 Units total) by mouth 4 (four) times daily for 7 days. Retain in mouth as long as possible before spitting out 140 mL 0   pantoprazole (PROTONIX) 40 MG tablet Take 1 tablet (40 mg total) by mouth daily. 90 tablet 1   PARoxetine (PAXIL) 30 MG tablet Take 2 tablets (60 mg total) by mouth daily. 180 tablet 3   pravastatin (PRAVACHOL) 20 MG tablet TAKE 1 TABLET(20 MG) BY MOUTH DAILY 90 tablet 2   No current facility-administered medications for this visit.   Allergies: Allergies  Allergen Reactions   Amoxicillin Hives  Articaine     Caused numbness that too long - lasted about 6 months when used by dentist   Atenolol Hives   Sulfonamide Derivatives     REACTION: Unknown reaction   Trimox [Amoxicillin Trihydrate]    I reviewed her past medical history, social history, family history, and environmental history and no significant changes have been reported from previous visit on 06/27/22.  Review of Systems  Constitutional:  Negative for chills and fever.  HENT:         Reports a little bit of rhinorrhea and denies nasal congestion and postnasal drip  Eyes:        Reports occasional itchy eyes.  She has saline eyedrops and Zaditor eyedrops to use as needed  Respiratory:  Positive for cough. Negative for chest tightness, shortness of breath and wheezing.        Reports occasional cough and denies wheezing, tightness in chest, shortness of breath, and nocturnal awakenings due to breathing problems  Cardiovascular:  Negative for palpitations.  Gastrointestinal:        Denies heartburn or reflux symptoms  Skin:        Reports beginnings of thrush at times and "funky taste"  Allergic/Immunologic: Positive for environmental allergies.   Objective: Physical Exam Not obtained as encounter was done via telephone.   Previous notes and  tests were reviewed.  I discussed the assessment and treatment plan with the patient. The patient was provided an opportunity to ask questions and all were answered. The patient agreed with the plan and demonstrated an understanding of the instructions.   The patient was advised to call back or seek an in-person evaluation if the symptoms worsen or if the condition fails to improve as anticipated.  I provided 17 minutes of non-face-to-face time during this encounter.  It was my pleasure to participate in [place] care today. Please feel free to contact me with any questions or concerns.   Sincerely,  Althea Charon, FNP

## 2022-10-14 NOTE — Telephone Encounter (Signed)
Attempted to call patients home number, was not able to leave voicemail, called mobile number and was routed to voicemail. Left a voicemail asking for patient to return call.

## 2022-10-17 NOTE — Telephone Encounter (Signed)
Patient was seen in office due to thrush on 10/14/2022 with Chrisse

## 2022-10-19 ENCOUNTER — Ambulatory Visit (INDEPENDENT_AMBULATORY_CARE_PROVIDER_SITE_OTHER): Payer: Medicaid Other

## 2022-10-19 DIAGNOSIS — J309 Allergic rhinitis, unspecified: Secondary | ICD-10-CM | POA: Diagnosis not present

## 2022-10-24 ENCOUNTER — Ambulatory Visit (INDEPENDENT_AMBULATORY_CARE_PROVIDER_SITE_OTHER): Payer: Medicaid Other | Admitting: *Deleted

## 2022-10-24 DIAGNOSIS — J309 Allergic rhinitis, unspecified: Secondary | ICD-10-CM | POA: Diagnosis not present

## 2022-10-25 ENCOUNTER — Other Ambulatory Visit: Payer: Self-pay | Admitting: Family

## 2022-11-08 ENCOUNTER — Ambulatory Visit (INDEPENDENT_AMBULATORY_CARE_PROVIDER_SITE_OTHER): Payer: Medicaid Other | Admitting: *Deleted

## 2022-11-08 DIAGNOSIS — J309 Allergic rhinitis, unspecified: Secondary | ICD-10-CM | POA: Diagnosis not present

## 2022-11-18 ENCOUNTER — Ambulatory Visit (INDEPENDENT_AMBULATORY_CARE_PROVIDER_SITE_OTHER): Payer: Medicaid Other

## 2022-11-18 DIAGNOSIS — J309 Allergic rhinitis, unspecified: Secondary | ICD-10-CM | POA: Diagnosis not present

## 2022-11-24 ENCOUNTER — Ambulatory Visit (INDEPENDENT_AMBULATORY_CARE_PROVIDER_SITE_OTHER): Payer: Medicaid Other

## 2022-11-24 DIAGNOSIS — J309 Allergic rhinitis, unspecified: Secondary | ICD-10-CM

## 2022-12-02 ENCOUNTER — Other Ambulatory Visit: Payer: Self-pay

## 2022-12-07 MED ORDER — PRAVASTATIN SODIUM 20 MG PO TABS: ORAL_TABLET | ORAL | 2 refills | Status: DC

## 2022-12-21 ENCOUNTER — Ambulatory Visit (INDEPENDENT_AMBULATORY_CARE_PROVIDER_SITE_OTHER): Payer: Medicaid Other

## 2022-12-21 DIAGNOSIS — J309 Allergic rhinitis, unspecified: Secondary | ICD-10-CM

## 2022-12-25 ENCOUNTER — Other Ambulatory Visit: Payer: Self-pay | Admitting: Family

## 2022-12-27 ENCOUNTER — Ambulatory Visit: Payer: Medicaid Other | Admitting: Allergy and Immunology

## 2023-01-17 ENCOUNTER — Ambulatory Visit (INDEPENDENT_AMBULATORY_CARE_PROVIDER_SITE_OTHER): Payer: Medicaid Other | Admitting: *Deleted

## 2023-01-17 DIAGNOSIS — J309 Allergic rhinitis, unspecified: Secondary | ICD-10-CM

## 2023-01-23 ENCOUNTER — Other Ambulatory Visit: Payer: Self-pay

## 2023-01-23 DIAGNOSIS — I10 Essential (primary) hypertension: Secondary | ICD-10-CM

## 2023-01-24 ENCOUNTER — Ambulatory Visit (INDEPENDENT_AMBULATORY_CARE_PROVIDER_SITE_OTHER): Payer: Medicaid Other | Admitting: Allergy and Immunology

## 2023-01-24 ENCOUNTER — Encounter: Payer: Self-pay | Admitting: Allergy and Immunology

## 2023-01-24 ENCOUNTER — Other Ambulatory Visit: Payer: Self-pay

## 2023-01-24 VITALS — BP 102/76 | HR 94 | Temp 98.3°F | Resp 16 | Ht 62.0 in | Wt 116.5 lb

## 2023-01-24 DIAGNOSIS — K219 Gastro-esophageal reflux disease without esophagitis: Secondary | ICD-10-CM

## 2023-01-24 DIAGNOSIS — J4530 Mild persistent asthma, uncomplicated: Secondary | ICD-10-CM | POA: Diagnosis not present

## 2023-01-24 DIAGNOSIS — J3089 Other allergic rhinitis: Secondary | ICD-10-CM

## 2023-01-24 MED ORDER — QVAR REDIHALER 80 MCG/ACT IN AERB: 2.0000 | INHALATION_SPRAY | Freq: Every morning | RESPIRATORY_TRACT | 3 refills | Status: DC

## 2023-01-24 MED ORDER — KETOTIFEN FUMARATE 0.035 % OP SOLN: 1.0000 [drp] | Freq: Two times a day (BID) | OPHTHALMIC | 3 refills | Status: AC

## 2023-01-24 MED ORDER — ALBUTEROL SULFATE HFA 108 (90 BASE) MCG/ACT IN AERS: 2.0000 | INHALATION_SPRAY | RESPIRATORY_TRACT | 1 refills | Status: DC | PRN

## 2023-01-24 MED ORDER — FLUTICASONE PROPIONATE 50 MCG/ACT NA SUSP: 2.0000 | Freq: Every day | NASAL | 3 refills | Status: DC

## 2023-01-24 MED ORDER — CETIRIZINE HCL 10 MG PO TABS: 10.0000 mg | ORAL_TABLET | Freq: Every day | ORAL | 3 refills | Status: DC | PRN

## 2023-01-24 MED ORDER — SYSTANE COMPLETE PF 0.6 % OP SOLN: 1.0000 [drp] | OPHTHALMIC | 3 refills | Status: DC | PRN

## 2023-01-24 NOTE — Progress Notes (Unsigned)
[address] - [place] - [place] - [place] - [place]   Follow-up Note  Referring Provider: Willette Cluster, MD Primary Provider: Willette Cluster, MD Date of Office Visit: 01/24/2023  Subjective:   Christina Burton (DOB: Apr 28, 1956) is a 67 y.o. female who [facility] in re-evaluation of the following:  HPI: Christina Burton returns to this clinic in reevaluation of asthma, allergic rhinitis, LPR.  I last saw her in this clinic several years ago and she visited with our nurse practitioner on 14 October 2022.  She is really doing very well regarding her asthma and it does not sound as though she has required a systemic steroid to treat exacerbation she rarely uses a short acting bronchodilator where she remains on Qvar on a consistent basis currently only used 1 time per day.  Her nose has really been doing quite well while using nasal fluticasone on a pretty consistent basis and she also continues on immunotherapy.  She is finishing her last vial of immunotherapy.  Her reflux has been under very good control while using a proton pump inhibitor.  Allergies as of 01/24/2023       Reactions   Amoxicillin Hives   Articaine    Caused numbness that too long - lasted about 6 months when used by dentist   Atenolol Hives   Sulfonamide Derivatives    REACTION: Unknown reaction   Trimox [amoxicillin Trihydrate]         Medication List    acetaminophen 500 MG tablet Commonly known as: TYLENOL Take 500 mg by mouth every 6 (six) hours as needed. For pain   albuterol 108 (90 Base) MCG/ACT inhaler Commonly known as: ProAir HFA Inhale 2 puffs into the lungs every 4 (four) hours as needed for wheezing or shortness of breath.   azelastine 0.1 % nasal spray Commonly known as: Astelin USE ONE SPRAY IN EACH NOSTRIL TWICE DAILY IF NEEDED   CALCIUM PO Take by mouth daily.   cetirizine 10 MG tablet Commonly known as: ZYRTEC Take 1 tablet (10 mg total)  by mouth daily.   EPINEPHrine 0.3 mg/0.3 mL Soaj injection Commonly known as: EPI-PEN Use as directed for anaphylactic reaction   fluticasone 50 MCG/ACT nasal spray Commonly known as: FLONASE SHAKE LIQUID AND USE 1 TO 2 SPRAYS IN EACH NOSTRIL EVERY DAY AS NEEDED FOR NASAL CONGESTION   hydrochlorothiazide 12.5 MG tablet Commonly known as: HYDRODIURIL TAKE 1 TABLET(12.5 MG) BY MOUTH DAILY   mesalamine 1000 MG suppository Commonly known as: CANASA Place 1,000 mg rectally daily as needed. Use as directed by your gastroenterologist for ulcerative colitis   mesalamine 1.2 g EC tablet Commonly known as: LIALDA Take 2.4 g by mouth daily.   multivitamin with minerals Tabs tablet Take 1 tablet by mouth daily.   pantoprazole 40 MG tablet Commonly known as: PROTONIX TAKE 1 TABLET(40 MG) BY MOUTH DAILY   PARoxetine 30 MG tablet Commonly known as: PAXIL Take 2 tablets (60 mg total) by mouth daily.   pravastatin 20 MG tablet Commonly known as: PRAVACHOL TAKE 1 TABLET(20 MG) BY MOUTH DAILY   Qvar RediHaler 80 MCG/ACT inhaler Generic drug: beclomethasone Inhale 2 puffs into the lungs 2 (two) times daily.   VITAMIN C PO Take 1 tablet by mouth daily.   VITAMIN D PO Take by mouth daily.    Past Medical History:  Diagnosis Date   Abnormal gait    Allergic rhinitis    Anxiety disorder  Asthma    Asthma    Falls 05/10/2018   Foot pain, bilateral    Gait disorder 06/29/2020   GERD (gastroesophageal reflux disease)    High cholesterol    Hypertension 04/24/2007   Qualifier: Diagnosis of  Problem Stop Reason:  By: Ardyth Harps MD, Estela     Mitral valve prolapse    Normal neurological exam 06/29/2020   Obsessive compulsive disorder    Osteoarthritis    Sinusitis    chronic   Ulcerative colitis    sees Dr. Loreta Ave - normal colonoscopy in Nov 2011   URI (upper respiratory infection) 09/25/2018   URI (upper respiratory infection) 09/25/2018    Past Surgical History:   Procedure Laterality Date   CARPAL TUNNEL RELEASE     HERNIA REPAIR  1959   TONSILLECTOMY AND ADENOIDECTOMY     TRIGGER FINGER RELEASE     right thumb    Review of systems negative except as noted in HPI / PMHx or noted below:  Review of Systems  Constitutional: Negative.   HENT: Negative.    Eyes: Negative.   Respiratory: Negative.    Cardiovascular: Negative.   Gastrointestinal: Negative.   Genitourinary: Negative.   Musculoskeletal: Negative.   Skin: Negative.   Neurological: Negative.   Endo/Heme/Allergies: Negative.   Psychiatric/Behavioral: Negative.       Objective:   Vitals:   01/24/23 1454  BP: 102/76  Pulse: 94  Resp: 16  Temp: 98.3 F (36.8 C)  SpO2: 97%   Height: 5\' 2"  (157.5 cm)  Weight: 116 lb 8 oz (52.8 kg)   Physical Exam Constitutional:      Appearance: She is not diaphoretic.  HENT:     Head: Normocephalic.     Right Ear: Tympanic membrane, ear canal and external ear normal.     Left Ear: Tympanic membrane, ear canal and external ear normal.     Nose: Nose normal. No mucosal edema or rhinorrhea.     Mouth/Throat:     Pharynx: Uvula midline. No oropharyngeal exudate.  Eyes:     Conjunctiva/sclera: Conjunctivae normal.  Neck:     Thyroid: No thyromegaly.     Trachea: Trachea normal. No tracheal tenderness or tracheal deviation.  Cardiovascular:     Rate and Rhythm: Normal rate and regular rhythm.     Heart sounds: Normal heart sounds, S1 normal and S2 normal. No murmur heard. Pulmonary:     Effort: No respiratory distress.     Breath sounds: Normal breath sounds. No stridor. No wheezing or rales.  Lymphadenopathy:     Head:     Right side of head: No tonsillar adenopathy.     Left side of head: No tonsillar adenopathy.     Cervical: No cervical adenopathy.  Skin:    Findings: No erythema or rash.     Nails: There is no clubbing.  Neurological:     Mental Status: She is alert.     Diagnostics:    Spirometry was performed and  demonstrated an FEV1 of 2.21 at 100 % of predicted.  Assessment and Plan:   1. Mild persistent asthma without complication   2. Other allergic rhinitis   3. LPRD (laryngopharyngeal reflux disease)     1. Continue Beclomethasone 80 - 2 inhalations 1 time per day    2. Continue Fluticasone 1-2 sprays each nostril 1 time per day  3. Continue immunotherapy and EpiPen. Can discontinue immunotherapy after completion of current vial  4. Continue pantoprazole 40 mg once  a day  5. Continue the following if needed:  A. nasal Astelin B. nasal saline C. Zyrtec D. Albuterol HFA E. Mucinex DM F. Systane and Zaditor   6. Return to clinic in 1 year or earlier if problem  7. Plan for fall flu vaccine  Reannah appears to be doing pretty well on her current therapy which includes some anti-inflammatory agents for airway and immunotherapy.  Her immunotherapy would be ending this year.  Hopefully she has altered her immune system enough so that she sustains a long-term response from this form of treatment.  She can continue on pantoprazole for reflux.  She has a selection of agents to be utilized should they be required.  I will see her back in this clinic in 1 year or earlier if there is a problem.  Laurette Schimke, MD Allergy / Immunology [facility]

## 2023-01-24 NOTE — Patient Instructions (Addendum)
  1. Continue Beclomethasone 80 - 2 inhalations 1 time per day    2. Continue Fluticasone 1-2 sprays each nostril 1 time per day  3. Continue immunotherapy and EpiPen. Can discontinue immunotherapy after completion of current vial  4. Continue pantoprazole 40 mg once a day  5. Continue the following if needed:  A. nasal Astelin B. nasal saline C. Zyrtec D. Albuterol HFA E. Mucinex DM F. Systane and Zaditor   6. Return to clinic in 1 year or earlier if problem  7. Plan for fall flu vaccine

## 2023-01-25 ENCOUNTER — Other Ambulatory Visit: Payer: Self-pay

## 2023-01-25 ENCOUNTER — Encounter: Payer: Self-pay | Admitting: Allergy and Immunology

## 2023-01-25 MED ORDER — ALBUTEROL SULFATE HFA 108 (90 BASE) MCG/ACT IN AERS: 2.0000 | INHALATION_SPRAY | RESPIRATORY_TRACT | 1 refills | Status: DC | PRN

## 2023-02-16 ENCOUNTER — Ambulatory Visit (INDEPENDENT_AMBULATORY_CARE_PROVIDER_SITE_OTHER): Payer: Medicaid Other | Admitting: *Deleted

## 2023-02-16 DIAGNOSIS — J309 Allergic rhinitis, unspecified: Secondary | ICD-10-CM

## 2023-03-17 ENCOUNTER — Ambulatory Visit (INDEPENDENT_AMBULATORY_CARE_PROVIDER_SITE_OTHER): Payer: Medicaid Other

## 2023-03-17 DIAGNOSIS — J309 Allergic rhinitis, unspecified: Secondary | ICD-10-CM

## 2023-04-13 ENCOUNTER — Ambulatory Visit (INDEPENDENT_AMBULATORY_CARE_PROVIDER_SITE_OTHER): Payer: Medicaid Other

## 2023-04-13 DIAGNOSIS — J309 Allergic rhinitis, unspecified: Secondary | ICD-10-CM

## 2023-04-23 ENCOUNTER — Other Ambulatory Visit: Payer: Self-pay | Admitting: Family

## 2023-05-19 ENCOUNTER — Ambulatory Visit (INDEPENDENT_AMBULATORY_CARE_PROVIDER_SITE_OTHER): Payer: Self-pay

## 2023-05-19 DIAGNOSIS — J309 Allergic rhinitis, unspecified: Secondary | ICD-10-CM

## 2023-07-18 ENCOUNTER — Encounter: Payer: Self-pay | Admitting: Nurse Practitioner

## 2023-08-11 ENCOUNTER — Encounter: Payer: Self-pay | Admitting: Nurse Practitioner

## 2023-10-05 ENCOUNTER — Ambulatory Visit: Payer: Medicare Other | Admitting: Student

## 2023-10-05 VITALS — BP 125/79 | HR 83 | Temp 98.0°F | Ht 62.0 in | Wt 133.1 lb

## 2023-10-05 DIAGNOSIS — G4700 Insomnia, unspecified: Secondary | ICD-10-CM | POA: Diagnosis not present

## 2023-10-05 DIAGNOSIS — Z13820 Encounter for screening for osteoporosis: Secondary | ICD-10-CM

## 2023-10-05 DIAGNOSIS — E785 Hyperlipidemia, unspecified: Secondary | ICD-10-CM | POA: Diagnosis present

## 2023-10-05 DIAGNOSIS — Z0000 Encounter for general adult medical examination without abnormal findings: Secondary | ICD-10-CM

## 2023-10-05 DIAGNOSIS — F422 Mixed obsessional thoughts and acts: Secondary | ICD-10-CM

## 2023-10-05 NOTE — Patient Instructions (Addendum)
 Thank you, Ms.Auburn Bilberry for allowing [place] to provide your care today.    I have ordered the following labs for you:   Lab Orders         Lipid Profile      Remember:   Practice Good Sleep Hygiene Here are some suggestions Avoid napping during the day. It can disturb the normal pattern of sleep and wakefulness.  Avoid stimulants such as caffeine, nicotine, and alcohol too close to bedtime. While alcohol is well known to speed the onset of sleep, it disrupts sleep in the second half as the body begins to metabolize the alcohol, causing arousal.  Exercise can promote good sleep. Vigorous exercise should be taken in the morning or late afternoon. A relaxing exercise, like yoga, can be done before bed to help initiate a restful night's sleep. Food can be disruptive right before sleep. Stay away from large meals close to bedtime. Also dietary changes can cause sleep problems, if someone is struggling with a sleep problem, it's not a good time to start experimenting with spicy dishes. And, remember, chocolate has caffeine.  Ensure adequate exposure to natural light. This is particularly important for older people who may not venture outside as frequently as children and adults. Light exposure helps maintain a healthy sleep-wake cycle.  Establish a regular relaxing bedtime routine. Try to avoid emotionally upsetting conversations and activities before trying to go to sleep. Don't dwell on, or bring your problems to bed.  Associate your bed with sleep. It's not a good idea to use your bed to watch TV, listen to the radio, or read.  Make sure that the sleep environment is pleasant and relaxing. The bed should be comfortable, the room should not be too hot or cold, or too bright.  Make sure to follow up with GI for your UC.   Make sure to get your Dexa Scan  Follow up: 3 months    Should you have any questions or concerns please call the internal medicine clinic at [phone].     Manuela Neptune, MD [facility]

## 2023-10-05 NOTE — Progress Notes (Signed)
 CC:  Chief Complaint  Patient presents with   Follow-up    3 month follow up / needing something for sleep   HPI:  [name] is a 68 y.o. female living with a history stated below and presents today for follow up on her insomnia and cholesterol. Please see problem based assessment and plan for additional details.  Past Medical History:  Diagnosis Date   Abnormal gait    Allergic rhinitis    Anxiety disorder    Asthma    Asthma    Falls 05/10/2018   Foot pain, bilateral    Gait disorder 06/29/2020   GERD (gastroesophageal reflux disease)    High cholesterol    Hypertension 04/24/2007   Qualifier: Diagnosis of  Problem Stop Reason:  By: Ardyth Harps MD, Estela     Mitral valve prolapse    Normal neurological exam 06/29/2020   Obsessive compulsive disorder    Osteoarthritis    Sinusitis    chronic   Ulcerative colitis    sees Dr. Loreta Ave - normal colonoscopy in Nov 2011   URI (upper respiratory infection) 09/25/2018   URI (upper respiratory infection) 09/25/2018   Current Outpatient Medications on File Prior to Visit  Medication Sig Dispense Refill   acetaminophen (TYLENOL) 500 MG tablet Take 500 mg by mouth every 6 (six) hours as needed. For pain     albuterol (PROAIR HFA) 108 (90 Base) MCG/ACT inhaler Inhale 2 puffs into the lungs every 4 (four) hours as needed for wheezing or shortness of breath. 18 g 1   albuterol (VENTOLIN HFA) 108 (90 Base) MCG/ACT inhaler Inhale 2 puffs into the lungs every 4 (four) hours as needed for wheezing or shortness of breath. 8 g 1   Ascorbic Acid (VITAMIN C PO) Take 1 tablet by mouth daily.     azelastine (ASTELIN) 0.1 % nasal spray USE ONE SPRAY IN EACH NOSTRIL TWICE DAILY IF NEEDED 30 mL 5   beclomethasone (QVAR REDIHALER) 80 MCG/ACT inhaler Inhale 2 puffs into the lungs in the morning. 31.8 g 3   CALCIUM PO Take by mouth daily.     cetirizine (ZYRTEC) 10 MG tablet Take 1 tablet (10 mg total) by mouth daily as needed for allergies  (Can take an exyra dose during flare ups.). 180 tablet 3   Cholecalciferol (VITAMIN D PO) Take by mouth daily.     EPINEPHrine 0.3 mg/0.3 mL IJ SOAJ injection Use as directed for anaphylactic reaction 2 each 1   fluticasone (FLONASE) 50 MCG/ACT nasal spray Place 2 sprays into both nostrils daily. SHAKE LIQUID AND USE 1 TO 2 SPRAYS IN EACH NOSTRIL EVERY DAY AS NEEDED FOR NASAL CONGESTION 48 g 3   hydrochlorothiazide (HYDRODIURIL) 12.5 MG tablet TAKE 1 TABLET(12.5 MG) BY MOUTH DAILY 90 tablet 3   ketotifen (ZADITOR) 0.035 % ophthalmic solution Place 1 drop into both eyes in the morning and at bedtime. 30 mL 3   mesalamine (CANASA) 1000 MG suppository Place 1,000 mg rectally daily as needed. Use as directed by your gastroenterologist for ulcerative colitis     mesalamine (LIALDA) 1.2 G EC tablet Take 2.4 g by mouth daily.     Multiple Vitamin (MULTIVITAMIN WITH MINERALS) TABS Take 1 tablet by mouth daily.     pantoprazole (PROTONIX) 40 MG tablet TAKE 1 TABLET(40 MG) BY MOUTH DAILY 90 tablet 2   pravastatin (PRAVACHOL) 20 MG tablet TAKE 1 TABLET(20 MG) BY MOUTH DAILY 90 tablet 2   Propylene Glycol, PF, (SYSTANE COMPLETE  PF) 0.6 % SOLN Apply 1 drop to eye every 4 (four) hours as needed. 30 mL 3   No current facility-administered medications on file prior to visit.    Family History  Problem Relation Age of Onset   Hypertension Mother    Hyperlipidemia Mother    Asthma Mother    Eczema Mother    Lymphoma Father    Seizures Sister    Asthma Sister    Diabetes Brother    Asthma Brother    Bladder Cancer Brother    Diabetes Maternal Grandmother    Allergic rhinitis Neg Hx    Angioedema Neg Hx    Immunodeficiency Neg Hx     Social History   Socioeconomic History   Marital status: Single    Spouse name: Not on file   Number of children: Not on file   Years of education: 14   Highest education level: Not on file  Occupational History   Occupation: Diplomatic Services operational officer, Warehouse manager, Print production planner: [employer]  Tobacco Use   Smoking status: Never   Smokeless tobacco: Never  Vaping Use   Vaping status: Never Used  Substance and Sexual Activity   Alcohol use: No    Alcohol/week: 0.0 standard drinks of alcohol   Drug use: No   Sexual activity: Never    Birth control/protection: Post-menopausal  Other Topics Concern   Not on file  Social History Narrative   Financial assistance approved for 85% discount at [facility] and has [facility] card per [employer]   02/16/2010         Social Drivers of Health   Financial Resource Strain: Not on file  Food Insecurity: Not on file  Transportation Needs: Not on file  Physical Activity: Not on file  Stress: Not on file  Social Connections: Not on file  Intimate Partner Violence: Not on file    Review of Systems: ROS negative except for what is noted on the assessment and plan.  Vitals:   10/05/23 1548  BP: 125/79  Pulse: 83  Temp: 98 F (36.7 C)  TempSrc: Oral  SpO2: 96%  Weight: 133 lb 1.6 oz (60.4 kg)  Height: 5\' 2"  (1.575 m)   Physical Exam: Constitutional: well-appearing, in NAD HENT: normocephalic atraumatic, mucous membranes moist Eyes: conjunctiva non-erythematous Cardiovascular: regular rate and rhythm, no m/r/g Pulmonary/Chest: normal work of breathing on room air, lungs clear to auscultation bilaterally Abdominal: soft, non-tender, non-distended MSK: normal bulk and tone Neurological: alert & oriented x 3, no focal deficit Skin: warm and dry Psych: normal mood and behavior  Assessment & Plan:   Patient discussed with Dr. Cleda Daub  Insomnia Has been having insomnia for many years. Also has Anxiety and OCD. Goes to bed at 11-12-1am sometimes and will wake up around 11am-12pm. Denies caffeinated products. Does watch TV before going to bed. Sometimes works on her bed, like organizing things on the bed. She finds that the paroxetine has helped with her anxiety but still remains anxious and is now  dealing with her mother's recent death. She died in May 27, 2024 of last year and has to take care of her house and clean it. I suspect her insomnia is due to multiple reasons, including her anxiety as well as poor sleep hygiene. Discussed with patient that the first step would be to address her sleep hygiene and that she should attempt to go to bed at an earlier time and wake up earlier in the morning so she is not  sleeping for most of the morning. Given that she has had some relief with paroxetine, hesitant to change this, but may benefit from adding another medication in the future or referral to behavioral health.   -Attempt to go to bed earlier around 10pm at night and get 8-9 hours of sleep.  -Do not stay in bed when awake  -Do not work in the bed, avoid working on the bed  -Avoid blue light before going to bed, at least 2 hours before going to bedtime -ensure regular lighting, get light in the morning  -Consider referral to behavioral health, addition of more anxiolytics for anxiety in the future.   Hyperlipidemia ASCVD risk around 6% in the past. Is currently on pravastatin 20mg  tablet. Had mammogram that showed calcifications. FH is strong for CAD. Will remeasure lipid panel today and consider increasing statin if it remains elevated.   Healthcare maintenance She is due for a dexa scan which she is willing to get done. Will get colonoscopy, has UC and follows with GI, Dr. Loreta Ave.    Manuela Neptune, MD [facility] Internal Medicine, PGY-1 Phone: [phone] Date 10/06/2023 Time 6:48 PM

## 2023-10-06 LAB — LIPID PANEL
Chol/HDL Ratio: 2.6 ratio (ref 0.0–4.4)
Cholesterol, Total: 216 mg/dL — ABNORMAL HIGH (ref 100–199)
HDL: 83 mg/dL (ref >39)
LDL Chol Calc (NIH): 111 mg/dL — ABNORMAL HIGH (ref 0–99)
Triglycerides: 129 mg/dL (ref 0–149)
VLDL Cholesterol Cal: 22 mg/dL (ref 5–40)

## 2023-10-06 MED ORDER — PAROXETINE HCL 30 MG PO TABS
60.0000 mg | ORAL_TABLET | Freq: Every day | ORAL | 3 refills | Status: AC
Start: 2023-10-06 — End: ?

## 2023-10-06 NOTE — Assessment & Plan Note (Signed)
 She is due for a dexa scan which she is willing to get done. Will get colonoscopy, has UC and follows with GI, Dr. Loreta Ave.

## 2023-10-06 NOTE — Assessment & Plan Note (Signed)
 ASCVD risk around 6% in the past. Is currently on pravastatin 20mg  tablet. Had mammogram that showed calcifications. FH is strong for CAD. Will remeasure lipid panel today and consider increasing statin if it remains elevated.

## 2023-10-06 NOTE — Assessment & Plan Note (Signed)
 Has been having insomnia for many years. Also has Anxiety and OCD. Goes to bed at 11-12-1am sometimes and will wake up around 11am-12pm. Denies caffeinated products. Does watch TV before going to bed. Sometimes works on her bed, like organizing things on the bed. She finds that the paroxetine has helped with her anxiety but still remains anxious and is now dealing with her mother's recent death. She died in 2024/05/20 of last year and has to take care of her house and clean it. I suspect her insomnia is due to multiple reasons, including her anxiety as well as poor sleep hygiene. Discussed with patient that the first step would be to address her sleep hygiene and that she should attempt to go to bed at an earlier time and wake up earlier in the morning so she is not sleeping for most of the morning. Given that she has had some relief with paroxetine, hesitant to change this, but may benefit from adding another medication in the future or referral to behavioral health.   -Attempt to go to bed earlier around 10pm at night and get 8-9 hours of sleep.  -Do not stay in bed when awake  -Do not work in the bed, avoid working on the bed  -Avoid blue light before going to bed, at least 2 hours before going to bedtime -ensure regular lighting, get light in the morning  -Consider referral to behavioral health, addition of more anxiolytics for anxiety in the future.

## 2023-10-10 ENCOUNTER — Encounter: Payer: Self-pay | Admitting: Student

## 2023-10-10 MED ORDER — PRAVASTATIN SODIUM 40 MG PO TABS: 40.0000 mg | ORAL_TABLET | Freq: Every day | ORAL | 3 refills | Status: DC

## 2023-10-10 NOTE — Addendum Note (Signed)
 Addended by: Manuela Neptune on: 10/10/2023 08:41 AM   Modules accepted: Orders

## 2023-10-10 NOTE — Addendum Note (Signed)
 Addended by: Carlynn Purl C on: 10/10/2023 04:06 PM   Modules accepted: Level of Service

## 2023-10-10 NOTE — Progress Notes (Signed)
 Internal Medicine Clinic Attending  Case discussed with the resident at the time of the visit.  We reviewed the resident's history and exam and pertinent patient test results.  I agree with the assessment, diagnosis, and plan of care documented in the resident's note.

## 2023-10-23 ENCOUNTER — Encounter: Payer: Self-pay | Admitting: Nurse Practitioner

## 2023-10-24 ENCOUNTER — Encounter: Payer: Self-pay | Admitting: Nurse Practitioner

## 2023-10-24 ENCOUNTER — Ambulatory Visit (INDEPENDENT_AMBULATORY_CARE_PROVIDER_SITE_OTHER): Payer: Medicaid Other | Admitting: Nurse Practitioner

## 2023-10-24 VITALS — BP 126/74 | HR 92 | Ht 62.5 in | Wt 136.0 lb

## 2023-10-24 DIAGNOSIS — Z01419 Encounter for gynecological examination (general) (routine) without abnormal findings: Secondary | ICD-10-CM

## 2023-10-24 DIAGNOSIS — Z780 Asymptomatic menopausal state: Secondary | ICD-10-CM

## 2023-10-24 DIAGNOSIS — Z13820 Encounter for screening for osteoporosis: Secondary | ICD-10-CM

## 2023-10-24 NOTE — Progress Notes (Signed)
   Christina Burton 01/01/1956 [id number]   History:  68 y.o. G0 presents for annual exam. Reports abnormal pap many years ago, negative biopsies. Postmenopausal - no HRT, no bleeding. HTN, GERD managed by PCP, asthma managed by allergy specialist.   Gynecologic History Patient's last menstrual period was 03/01/2010.   Contraception/Family planning: post menopausal status Sexually active: Never  Health Maintenance Last Pap: 09/19/2022. Results were: Normal neg HPV Last mammogram: 07/17/2023. Results were: Normal Last colonoscopy: 06/21/2013. Results were: Normal, 10-year recall Last Dexa: Never  Past medical history, past surgical history, family history and social history were all reviewed and documented in the EPIC chart. Mother passed away in 06/13/24. Patient cared for her.   ROS:  A ROS was performed and pertinent positives and negatives are included.  Exam:  Vitals:   10/24/23 1518  BP: 126/74  Pulse: 92  SpO2: 97%  Weight: 136 lb (61.7 kg)  Height: 5' 2.5" (1.588 m)    Body mass index is 24.48 kg/m.  General appearance:  Normal Thyroid:  Symmetrical, normal in size, without palpable masses or nodularity. Respiratory  Auscultation:  Clear without wheezing or rhonchi Cardiovascular  Auscultation:  Regular rate, without rubs, murmurs or gallops  Edema/varicosities:  Not grossly evident Abdominal  Soft,nontender, without masses, guarding or rebound.  Liver/spleen:  No organomegaly noted  Hernia:  None appreciated  Skin  Inspection:  Grossly normal Breasts: Examined lying and sitting.   Right: Without masses, retractions, nipple discharge or axillary adenopathy.   Left: Without masses, retractions, nipple discharge or axillary adenopathy. Genitourinary   Inguinal/mons:  Normal without inguinal adenopathy  External genitalia:  Normal appearing vulva with no masses, tenderness, or lesions  BUS/Urethra/Skene's glands:  Normal  Vagina:  Normal appearing with normal  color and discharge, no lesions. Atrophic changes  Cervix:  Difficult to visualize due to discomfort of exam, adolescent speculum used  Uterus:  Normal in size, shape and contour.  Midline and mobile, nontender  Adnexa/parametria:     Rt: Normal in size, without masses or tenderness.   Lt: Normal in size, without masses or tenderness.  Anus and perineum: Normal  Digital rectal exam: Deferred  Patient informed chaperone available to be present for breast and pelvic exam. Patient has requested no chaperone to be present. Patient has been advised what will be completed during breast and pelvic exam.   Assessment/Plan:  68 y.o. G0 for annual exam.    Encounter for breast and pelvic examination - Education provided on SBEs, importance of preventative screenings, current guidelines, high calcium diet, regular exercise, and multivitamin daily. Labs with PCP.   Postmenopausal - Plan: DG Bone Density. No HRT, no bleeding  Screening for osteoporosis - Plan: DG Bone Density  Screening for cervical cancer - Reports abnormal pap many years ago, negative biopsies.  No longer screening per guidelines.   Screening for breast cancer - Normal mammogram history.  Continue annual screenings.  Normal breast exam today.  Screening for colon cancer - 2024 colonoscopy. Due now and plans to schedule soon.   Return in about 2 years (around 10/23/2025) for B&P.    [name], 4:00 PM 10/24/2023

## 2023-11-01 ENCOUNTER — Other Ambulatory Visit: Payer: Self-pay | Admitting: Family

## 2023-11-15 ENCOUNTER — Encounter: Payer: Self-pay | Admitting: Nurse Practitioner

## 2023-11-16 ENCOUNTER — Encounter: Payer: Self-pay | Admitting: Nurse Practitioner

## 2023-12-19 ENCOUNTER — Encounter: Payer: Self-pay | Admitting: Nurse Practitioner

## 2024-01-08 MED ORDER — FLUTICASONE PROPIONATE HFA 110 MCG/ACT IN AERO: INHALATION_SPRAY | RESPIRATORY_TRACT | 0 refills | Status: AC

## 2024-01-23 ENCOUNTER — Encounter: Payer: Self-pay | Admitting: Allergy and Immunology

## 2024-01-23 ENCOUNTER — Other Ambulatory Visit: Payer: Self-pay

## 2024-01-23 ENCOUNTER — Ambulatory Visit (INDEPENDENT_AMBULATORY_CARE_PROVIDER_SITE_OTHER): Admitting: Allergy and Immunology

## 2024-01-23 VITALS — BP 110/78 | HR 90 | Temp 97.9°F | Ht 62.21 in | Wt 136.6 lb

## 2024-01-23 DIAGNOSIS — J3089 Other allergic rhinitis: Secondary | ICD-10-CM | POA: Diagnosis not present

## 2024-01-23 DIAGNOSIS — K219 Gastro-esophageal reflux disease without esophagitis: Secondary | ICD-10-CM | POA: Diagnosis not present

## 2024-01-23 DIAGNOSIS — J4520 Mild intermittent asthma, uncomplicated: Secondary | ICD-10-CM | POA: Diagnosis not present

## 2024-01-23 DIAGNOSIS — G4720 Circadian rhythm sleep disorder, unspecified type: Secondary | ICD-10-CM | POA: Diagnosis not present

## 2024-01-23 MED ORDER — PANTOPRAZOLE SODIUM 40 MG PO TBEC
40.0000 mg | DELAYED_RELEASE_TABLET | Freq: Two times a day (BID) | ORAL | 1 refills | Status: AC
Start: 2024-01-23 — End: ?

## 2024-01-23 MED ORDER — ALBUTEROL SULFATE HFA 108 (90 BASE) MCG/ACT IN AERS
2.0000 | INHALATION_SPRAY | RESPIRATORY_TRACT | 1 refills | Status: AC | PRN
Start: 2024-01-23 — End: ?

## 2024-01-23 MED ORDER — CYPROHEPTADINE HCL 4 MG PO TABS: 2.0000 mg | ORAL_TABLET | Freq: Every evening | ORAL | 3 refills | Status: AC

## 2024-01-23 MED ORDER — FLUTICASONE PROPIONATE 50 MCG/ACT NA SUSP: 2.0000 | Freq: Every day | NASAL | 3 refills | Status: AC

## 2024-01-23 MED ORDER — AZELASTINE HCL 0.1 % NA SOLN: 1.0000 | Freq: Two times a day (BID) | NASAL | 3 refills | Status: AC | PRN

## 2024-01-23 MED ORDER — SYSTANE COMPLETE PF 0.6 % OP SOLN: 1.0000 [drp] | OPHTHALMIC | 3 refills | Status: AC | PRN

## 2024-01-23 MED ORDER — CETIRIZINE HCL 10 MG PO TABS
10.0000 mg | ORAL_TABLET | Freq: Every day | ORAL | 3 refills | Status: AC | PRN
Start: 2024-01-23 — End: ?

## 2024-01-23 MED ORDER — QVAR REDIHALER 80 MCG/ACT IN AERB: 2.0000 | INHALATION_SPRAY | RESPIRATORY_TRACT | 1 refills | Status: AC | PRN

## 2024-01-23 NOTE — Progress Notes (Unsigned)
 Christina Burton - Christina Burton - Christina Burton - Christina Burton - Christina Burton   Follow-up Note  Referring Provider: Heddy Barren, DO Primary Provider: Heddy Barren, DO Date of Office Visit: 01/23/2024  Subjective:   Christina Burton (DOB: 10-07-1955) is a 68 y.o. female who [facility] in re-evaluation of the following:  HPI: Christina Burton returns to this clinic in evaluation of asthma, allergic rhinitis, LPR.  I have not seen her in this clinic since 24 January 2023.  She has really been doing relatively well regarding her airway though she does have some intermittent cough with some intermittent throat clearing.  She is not sure if this is reflux or this is asthma.  She continues to use her pantoprazole  once a day and she thinks that her classic reflux symptoms are under pretty good control.  She will sometimes use an albuterol  which may help regarding some of her cough.  She is not using any Qvar  at this Burton in time.  She has very little upper airway symptoms while using intermittent Flonase .  It does not sound as though she is required a systemic steroid or an antibiotic for any type of airway issue.  She was able to discontinue her immunotherapy over the course of the past year.  She relates a history of having many years of very bad insomnia.  She has initiation insomnia.  As well, she has fractured sleep and wakes up early.  In the past I have given her Periactin  which worked very well but it did make her sleepy in the morning.  She thinks that she was using a half a tablet at that Burton in time.  Allergies as of 01/23/2024       Reactions   Amoxicillin Hives   Articaine    Caused numbness that too long - lasted about 6 months when used by dentist   Atenolol Hives   Sulfonamide Derivatives    REACTION: Unknown reaction   Trimox [amoxicillin Trihydrate]         Medication List    acetaminophen  500 MG tablet Commonly known as: TYLENOL  Take 500  mg by mouth every 6 (six) hours as needed. For pain   albuterol  108 (90 Base) MCG/ACT inhaler Commonly known as: ProAir  HFA Inhale 2 puffs into the lungs every 4 (four) hours as needed for wheezing or shortness of breath.   albuterol  108 (90 Base) MCG/ACT inhaler Commonly known as: VENTOLIN  HFA Inhale 2 puffs into the lungs every 4 (four) hours as needed for wheezing or shortness of breath.   azelastine  0.1 % nasal spray Commonly known as: Astelin  USE ONE SPRAY IN EACH NOSTRIL TWICE DAILY IF NEEDED   CALCIUM PO Take by mouth daily.   cetirizine  10 MG tablet Commonly known as: ZYRTEC  Take 1 tablet (10 mg total) by mouth daily as needed for allergies (Can take an exyra dose during flare ups.).   EPINEPHrine  0.3 mg/0.3 mL Soaj injection Commonly known as: EPI-PEN Use as directed for anaphylactic reaction   fluticasone  110 MCG/ACT inhaler Commonly known as: FLOVENT  HFA Inhale two puffs once daily to prevent cough or wheeze. Rinse, gargle, and spit after use.   fluticasone  50 MCG/ACT nasal spray Commonly known as: FLONASE  Place 2 sprays into both nostrils daily. SHAKE LIQUID AND USE 1 TO 2 SPRAYS IN EACH NOSTRIL EVERY DAY AS NEEDED FOR NASAL CONGESTION   hydrochlorothiazide  12.5 MG tablet Commonly known as: HYDRODIURIL  TAKE 1 TABLET(12.5 MG) BY MOUTH DAILY  ketotifen  0.035 % ophthalmic solution Commonly known as: ZADITOR  Place 1 drop into both eyes in the morning and at bedtime.   mesalamine 1000 MG suppository Commonly known as: CANASA Place 1,000 mg rectally daily as needed. Use as directed by your gastroenterologist for ulcerative colitis   mesalamine 1.2 g EC tablet Commonly known as: LIALDA Take 2.4 g by mouth daily.   multivitamin with minerals Tabs tablet Take 1 tablet by mouth daily.   pantoprazole  40 MG tablet Commonly known as: PROTONIX  TAKE 1 TABLET(40 MG) BY MOUTH DAILY   PARoxetine  30 MG tablet Commonly known as: PAXIL  Take 2 tablets (60 mg total) by  mouth daily.   pravastatin  40 MG tablet Commonly known as: PRAVACHOL  Take 1 tablet (40 mg total) by mouth daily.   Qvar  RediHaler 80 MCG/ACT inhaler Generic drug: beclomethasone Inhale 2 puffs into the lungs in the morning.   Systane Complete PF 0.6 % Soln Generic drug: Propylene Glycol (PF) Apply 1 drop to eye every 4 (four) hours as needed.   VITAMIN C PO Take 1 tablet by mouth daily.   VITAMIN D  PO Take by mouth daily.    Past Medical History:  Diagnosis Date   Abnormal gait    Allergic rhinitis    Anxiety disorder    Asthma    Asthma    Falls 05/10/2018   Foot pain, bilateral    Gait disorder 06/29/2020   GERD (gastroesophageal reflux disease)    Christina cholesterol    Hypertension 04/24/2007   Qualifier: Diagnosis of  Problem Stop Reason:  By: Theophilus MD, Estela     Mitral valve prolapse    Normal neurological exam 06/29/2020   Obsessive compulsive disorder    Osteoarthritis    Sinusitis    chronic   Ulcerative colitis    sees Dr. Kristie - normal colonoscopy in Nov 2011   URI (upper respiratory infection) 09/25/2018   URI (upper respiratory infection) 09/25/2018    Past Surgical History:  Procedure Laterality Date   CARPAL TUNNEL RELEASE     HERNIA REPAIR  1959   TONSILLECTOMY AND ADENOIDECTOMY     TRIGGER FINGER RELEASE     right thumb    Review of systems negative except as noted in HPI / PMHx or noted below:  Review of Systems  Constitutional: Negative.   HENT: Negative.    Eyes: Negative.   Respiratory: Negative.    Cardiovascular: Negative.   Gastrointestinal: Negative.   Genitourinary: Negative.   Musculoskeletal: Negative.   Skin: Negative.   Neurological: Negative.   Endo/Heme/Allergies: Negative.   Psychiatric/Behavioral: Negative.       Objective:   Vitals:   01/23/24 1638  BP: 110/78  Pulse: 90  Temp: 97.9 F (36.6 C)  SpO2: 96%   Height: 5' 2.21 (158 cm)  Weight: 136 lb 9.6 oz (62 kg)   Physical  Exam Constitutional:      Appearance: She is not diaphoretic.  HENT:     Head: Normocephalic.     Right Ear: Tympanic membrane, ear canal and external ear normal.     Left Ear: Tympanic membrane, ear canal and external ear normal.     Nose: Nose normal. No mucosal edema or rhinorrhea.     Mouth/Throat:     Pharynx: Uvula midline. No oropharyngeal exudate.   Eyes:     Conjunctiva/sclera: Conjunctivae normal.   Neck:     Thyroid : No thyromegaly.     Trachea: Trachea normal. No tracheal tenderness or tracheal  deviation.   Cardiovascular:     Rate and Rhythm: Normal rate and regular rhythm.     Heart sounds: Normal heart sounds, S1 normal and S2 normal. No murmur heard. Pulmonary:     Effort: No respiratory distress.     Breath sounds: Normal breath sounds. No stridor. No wheezing or rales.  Lymphadenopathy:     Head:     Right side of head: No tonsillar adenopathy.     Left side of head: No tonsillar adenopathy.     Cervical: No cervical adenopathy.   Skin:    Findings: No erythema or rash.     Nails: There is no clubbing.   Neurological:     Mental Status: She is alert.     Diagnostics: Spirometry was performed and demonstrated an FEV1 of 2.12 at 97 % of predicted.  Assessment and Plan:   1. Asthma, mild intermittent, well-controlled   2. Other allergic rhinitis   3. LPRD (laryngopharyngeal reflux disease)   4. Dysfunction of sleep stage or arousal     1. Continue Fluticasone  1-2 sprays each nostril 3-7 times per week  2. Continue pantoprazole  40 mg - 1 tablet 1-2 times per day  3. Start Periactin  4 mg - 1/4-1/2 tablet at bedtime to treat insomnia  4. Continue the following if needed:  A. nasal Astelin  B. nasal saline C. Zyrtec  D. Albuterol  + Qvar  80 - 2 inhalations TOGETHER every 4-6 hours E. Mucinex DM F. Systane    5. Contact clinic in 4 weeks with update concerning insomnia  6. Return to clinic in 1 year or earlier if problem  7. Influenza =  Tamiflu. Covid = Paxlovid  Nijae appears to be doing quite well and she can use some nasal fluticasone  as needed during periods of upper airway symptoms, pantoprazole  1 or 2 times a day depending on disease activity, and a combination of albuterol  and Qvar  as an anti-inflammatory rescue plan should be required.  And we will start her on Periactin  for her insomnia.  She will contact us  in 4 weeks with an update concerning her response to this therapy.  She does well see her back in this clinic in 1 year.  Camellia Denis, MD Allergy / Immunology [facility]

## 2024-01-23 NOTE — Patient Instructions (Addendum)
  1. Continue Fluticasone  1-2 sprays each nostril 3-7 times per week  2. Continue pantoprazole  40 mg - 1 tablet 1-2 times per day  3. Start Periactin  4 mg - 1/4-1/2 tablet at bedtime to treat insomnia  4. Continue the following if needed:  A. nasal Astelin  B. nasal saline C. Zyrtec  D. Albuterol  + Qvar  80 - 2 inhalations TOGETHER every 4-6 hours E. Mucinex DM F. Systane    5. Contact clinic in 4 weeks with update concerning insomnia  6. Return to clinic in 1 year or earlier if problem  7. Influenza = Tamiflu. Covid = Paxlovid

## 2024-01-24 ENCOUNTER — Telehealth: Payer: Self-pay

## 2024-01-24 ENCOUNTER — Encounter: Payer: Self-pay | Admitting: Allergy and Immunology

## 2024-01-24 NOTE — Telephone Encounter (Signed)
 Approved. This drug has been approved under the Member's Medicare Part D benefit. Approved quantity: 45 units per 90 day(s). You may fill up to a 90 day supply except for those on Specialty Tier 5, which can be filled up to a 30 day supply. Please call the pharmacy to process the prescription claim.

## 2024-01-24 NOTE — Telephone Encounter (Signed)
*  AA  Pharmacy Patient Advocate Encounter   Received notification from CoverMyMeds that prior authorization for Cyproheptadine  HCl 4MG  tablets  is required/requested.   Insurance verification completed.   The patient is insured through [facility] .   Per test claim: PA required; PA submitted to above mentioned insurance via CoverMyMeds Key/confirmation #/[id number] Status is pending

## 2024-03-26 ENCOUNTER — Other Ambulatory Visit: Payer: Self-pay | Admitting: Student

## 2024-03-26 ENCOUNTER — Telehealth: Payer: Self-pay | Admitting: Allergy and Immunology

## 2024-03-26 DIAGNOSIS — I10 Essential (primary) hypertension: Secondary | ICD-10-CM

## 2024-03-26 MED ORDER — HYDROCHLOROTHIAZIDE 12.5 MG PO TABS: ORAL_TABLET | ORAL | 0 refills | Status: DC

## 2024-03-26 NOTE — Telephone Encounter (Signed)
 Please see note regarding request for 90 day supply.

## 2024-03-26 NOTE — Telephone Encounter (Signed)
 PT called to see if generic version of Qvar  is available, as insurance does not seem to be covering and Kozlow had asked for PT to complement with the Ventolin  - I advised would get back to Kozlow/clinical, she thanked

## 2024-03-26 NOTE — Telephone Encounter (Signed)
 Copied from CRM #[id number]. Topic: Clinical - Medication Refill >> Mar 26, 2024 10:25 AM Diannia H wrote: Medication: hydrochlorothiazide  (HYDRODIURIL ) 12.5 MG tablet  Has the patient contacted their pharmacy? Yes (Agent: If no, request that the patient contact the pharmacy for the refill. If patient does not wish to contact the pharmacy document the reason why and proceed with request.) (Agent: If yes, when and what did the pharmacy advise?)  This is the patient's preferred pharmacy:  [id number] [address], [place] - [address] AT [facility] [address] [facility] [address] [place] [address] Phone: [phone] Fax: [phone]  Is this the correct pharmacy for this prescription? Yes If no, delete pharmacy and type the correct one.   Has the prescription been filled recently? No  Is the patient out of the medication? No  Has the patient been seen for an appointment in the last year OR does the patient have an upcoming appointment? Yes  Can we respond through MyChart? Yes  Agent: Please be advised that Rx refills may take up to 3 business days. We ask that you follow-up with your pharmacy.

## 2024-04-03 NOTE — Telephone Encounter (Signed)
 Per Dr. Maurilio:  What inhaled steroid will her insurance company cover?    Called [facility] [address] [employer] [address] - spoke to [place], Rande Buzzard of pharmacy dept - DOB verified - stated no alternatives comes up - only stating Drug not on formulary.   Called patient - DOB/DPR verified - LMOVM regarding above notation - advised to contact her insurance company and inquiry what are the preferred inhaled steroid medications, price then to contact office with update.  If/When patient call back - please advise of the above notation.

## 2024-05-06 ENCOUNTER — Other Ambulatory Visit: Payer: Self-pay

## 2024-07-01 LAB — HM COLONOSCOPY

## 2024-07-02 ENCOUNTER — Telehealth: Payer: Self-pay

## 2024-07-02 NOTE — Telephone Encounter (Addendum)
 Received a fax from the pharmacy requesting for pravastatin  to be discontinued and replaced with a rx for simvastatin. Form has been placed in the yellow teams box.

## 2024-07-03 ENCOUNTER — Other Ambulatory Visit: Payer: Self-pay | Admitting: Student

## 2024-07-03 MED ORDER — SIMVASTATIN 40 MG PO TABS: 40.0000 mg | ORAL_TABLET | Freq: Every day | ORAL | 3 refills | Status: AC

## 2024-07-10 NOTE — Telephone Encounter (Signed)
 Copied from CRM #[id number]. Topic: Clinical - Medication Question >> Jul 10, 2024  3:30 PM [name] wrote: Reason for CRM: patient is calling to ask why the pravastatin  (PRAVACHOL ) 40 MG tablet Was changed yesterday to simvastatin  (ZOCOR ) 40 MG tablet Patient is asking anything changed for her to have a new prescription called into the pharmacy since she has had no problems with the previous prescription   Pt num [phone] [name]  [id number] [address], [place] - [address] AT [facility] [address] & [facility] [address] [place] [address] Phone: [phone]  Fax: [phone]

## 2024-07-11 NOTE — Telephone Encounter (Signed)
Called pt - no answer nor vm, unable to leave a message.

## 2024-07-17 LAB — HM MAMMOGRAPHY

## 2024-07-28 ENCOUNTER — Other Ambulatory Visit: Payer: Self-pay | Admitting: Student

## 2024-07-28 DIAGNOSIS — I10 Essential (primary) hypertension: Secondary | ICD-10-CM

## 2024-07-29 NOTE — Telephone Encounter (Signed)
 Medication sent to pharmacy

## 2024-08-08 ENCOUNTER — Telehealth: Payer: Self-pay | Admitting: *Deleted

## 2024-08-08 NOTE — Telephone Encounter (Signed)
 I called [facility] who stated it was probably d/t her insurance. We had received a fax  on 12/2 (Received a fax from the pharmacy requesting for pravastatin  to be discontinued and replaced with a rx for simvastatin . Form has been placed in the yellow teams box.) I called pt - no answer;  nor vm - unable to leave a message.

## 2024-08-08 NOTE — Telephone Encounter (Signed)
 Copied from CRM [id number]. Topic: Clinical - Prescription Issue >> Aug 08, 2024  2:08 PM Miquel SAILOR wrote: Reason for CRM: PT calling in on why they switched from pravastatin  (PRAVACHOL ) 20 MG tablet [[id number]] DISCONTINUED To simvastatin  (ZOCOR ) 40 MG tablet Needs call back [phone]
